# Patient Record
Sex: Female | Born: 1950 | Race: Black or African American | Hispanic: No | Marital: Married | Smoking: Former smoker
Health system: Southern US, Community
[De-identification: ages and names within clinical notes are randomized; demographics above are authoritative.]

## PROBLEM LIST (undated history)

## (undated) DIAGNOSIS — I10 Essential (primary) hypertension: Secondary | ICD-10-CM

## (undated) DIAGNOSIS — Z9010 Acquired absence of unspecified breast and nipple: Secondary | ICD-10-CM

## (undated) DIAGNOSIS — M1990 Unspecified osteoarthritis, unspecified site: Secondary | ICD-10-CM

## (undated) DIAGNOSIS — Z9882 Breast implant status: Secondary | ICD-10-CM

## (undated) DIAGNOSIS — Z9889 Other specified postprocedural states: Secondary | ICD-10-CM

## (undated) DIAGNOSIS — I639 Cerebral infarction, unspecified: Secondary | ICD-10-CM

## (undated) DIAGNOSIS — E119 Type 2 diabetes mellitus without complications: Secondary | ICD-10-CM

## (undated) DIAGNOSIS — C801 Malignant (primary) neoplasm, unspecified: Secondary | ICD-10-CM

## (undated) HISTORY — PX: REDUCTION MAMMAPLASTY: SUR839

## (undated) HISTORY — PX: SHOULDER SURGERY: SHX246

## (undated) HISTORY — PX: TUBAL LIGATION: SHX77

---

## 2014-01-13 DIAGNOSIS — R011 Cardiac murmur, unspecified: Secondary | ICD-10-CM | POA: Insufficient documentation

## 2014-01-13 DIAGNOSIS — E669 Obesity, unspecified: Secondary | ICD-10-CM | POA: Insufficient documentation

## 2014-01-13 DIAGNOSIS — E66811 Obesity, class 1: Secondary | ICD-10-CM | POA: Insufficient documentation

## 2015-04-29 HISTORY — PX: MASTECTOMY: SHX3

## 2017-06-24 ENCOUNTER — Inpatient Hospital Stay (HOSPITAL_COMMUNITY)
Admission: EM | Admit: 2017-06-24 | Discharge: 2017-06-26 | DRG: 603 | Disposition: A | Discharge status: Home / Self Care | Payer: Medicare Other | Attending: Family Medicine | Admitting: Family Medicine

## 2017-06-24 ENCOUNTER — Encounter (HOSPITAL_COMMUNITY): Payer: Self-pay | Admitting: Emergency Medicine

## 2017-06-24 ENCOUNTER — Emergency Department (HOSPITAL_COMMUNITY): Payer: Medicare Other

## 2017-06-24 DIAGNOSIS — Z91040 Latex allergy status: Secondary | ICD-10-CM

## 2017-06-24 DIAGNOSIS — L03221 Cellulitis of neck: Principal | ICD-10-CM | POA: Diagnosis present

## 2017-06-24 DIAGNOSIS — E872 Acidosis, unspecified: Secondary | ICD-10-CM | POA: Diagnosis present

## 2017-06-24 DIAGNOSIS — Z881 Allergy status to other antibiotic agents status: Secondary | ICD-10-CM | POA: Diagnosis not present

## 2017-06-24 DIAGNOSIS — Z8673 Personal history of transient ischemic attack (TIA), and cerebral infarction without residual deficits: Secondary | ICD-10-CM | POA: Diagnosis not present

## 2017-06-24 DIAGNOSIS — Z9010 Acquired absence of unspecified breast and nipple: Secondary | ICD-10-CM | POA: Insufficient documentation

## 2017-06-24 DIAGNOSIS — E109 Type 1 diabetes mellitus without complications: Secondary | ICD-10-CM | POA: Diagnosis present

## 2017-06-24 DIAGNOSIS — Z87891 Personal history of nicotine dependence: Secondary | ICD-10-CM | POA: Diagnosis not present

## 2017-06-24 DIAGNOSIS — Z853 Personal history of malignant neoplasm of breast: Secondary | ICD-10-CM | POA: Insufficient documentation

## 2017-06-24 DIAGNOSIS — E876 Hypokalemia: Secondary | ICD-10-CM | POA: Diagnosis not present

## 2017-06-24 DIAGNOSIS — C801 Malignant (primary) neoplasm, unspecified: Secondary | ICD-10-CM | POA: Diagnosis not present

## 2017-06-24 DIAGNOSIS — E119 Type 2 diabetes mellitus without complications: Secondary | ICD-10-CM

## 2017-06-24 DIAGNOSIS — K1120 Sialoadenitis, unspecified: Secondary | ICD-10-CM | POA: Diagnosis not present

## 2017-06-24 DIAGNOSIS — Z7982 Long term (current) use of aspirin: Secondary | ICD-10-CM

## 2017-06-24 DIAGNOSIS — M1990 Unspecified osteoarthritis, unspecified site: Secondary | ICD-10-CM | POA: Insufficient documentation

## 2017-06-24 DIAGNOSIS — Z9882 Breast implant status: Secondary | ICD-10-CM

## 2017-06-24 DIAGNOSIS — I10 Essential (primary) hypertension: Secondary | ICD-10-CM | POA: Diagnosis not present

## 2017-06-24 DIAGNOSIS — Z7984 Long term (current) use of oral hypoglycemic drugs: Secondary | ICD-10-CM | POA: Diagnosis not present

## 2017-06-24 DIAGNOSIS — Z79899 Other long term (current) drug therapy: Secondary | ICD-10-CM | POA: Diagnosis not present

## 2017-06-24 DIAGNOSIS — Z9889 Other specified postprocedural states: Secondary | ICD-10-CM | POA: Insufficient documentation

## 2017-06-24 DIAGNOSIS — A419 Sepsis, unspecified organism: Secondary | ICD-10-CM | POA: Diagnosis not present

## 2017-06-24 DIAGNOSIS — Z9012 Acquired absence of left breast and nipple: Secondary | ICD-10-CM

## 2017-06-24 DIAGNOSIS — K1121 Acute sialoadenitis: Secondary | ICD-10-CM | POA: Diagnosis not present

## 2017-06-24 DIAGNOSIS — K115 Sialolithiasis: Secondary | ICD-10-CM | POA: Diagnosis not present

## 2017-06-24 DIAGNOSIS — I639 Cerebral infarction, unspecified: Secondary | ICD-10-CM

## 2017-06-24 DIAGNOSIS — R221 Localized swelling, mass and lump, neck: Secondary | ICD-10-CM | POA: Diagnosis not present

## 2017-06-24 HISTORY — DX: Malignant (primary) neoplasm, unspecified: C80.1

## 2017-06-24 HISTORY — DX: Cerebral infarction, unspecified: I63.9

## 2017-06-24 HISTORY — DX: Other specified postprocedural states: Z98.890

## 2017-06-24 HISTORY — DX: Breast implant status: Z98.82

## 2017-06-24 HISTORY — DX: Acquired absence of unspecified breast and nipple: Z90.10

## 2017-06-24 HISTORY — DX: Type 2 diabetes mellitus without complications: E11.9

## 2017-06-24 HISTORY — DX: Essential (primary) hypertension: I10

## 2017-06-24 HISTORY — DX: Unspecified osteoarthritis, unspecified site: M19.90

## 2017-06-24 LAB — CBC WITH DIFFERENTIAL/PLATELET
BASOS ABS: 0 10*3/uL (ref 0.0–0.1)
Basophils Relative: 0 %
Eosinophils Absolute: 0 10*3/uL (ref 0.0–0.7)
Eosinophils Relative: 0 %
HEMATOCRIT: 36.9 % (ref 36.0–46.0)
Hemoglobin: 12.2 g/dL (ref 12.0–15.0)
LYMPHS PCT: 4 %
Lymphs Abs: 0.7 10*3/uL (ref 0.7–4.0)
MCH: 28 pg (ref 26.0–34.0)
MCHC: 33.1 g/dL (ref 30.0–36.0)
MCV: 84.6 fL (ref 78.0–100.0)
MONO ABS: 1.2 10*3/uL — AB (ref 0.1–1.0)
Monocytes Relative: 8 %
NEUTROS ABS: 13.7 10*3/uL — AB (ref 1.7–7.7)
Neutrophils Relative %: 88 %
Platelets: 252 10*3/uL (ref 150–400)
RBC: 4.36 MIL/uL (ref 3.87–5.11)
RDW: 12.8 % (ref 11.5–15.5)
WBC: 15.6 10*3/uL — ABNORMAL HIGH (ref 4.0–10.5)

## 2017-06-24 LAB — COMPREHENSIVE METABOLIC PANEL
ALT: 22 U/L (ref 14–54)
AST: 17 U/L (ref 15–41)
Albumin: 3.8 g/dL (ref 3.5–5.0)
Alkaline Phosphatase: 86 U/L (ref 38–126)
Anion gap: 14 (ref 5–15)
BILIRUBIN TOTAL: 0.6 mg/dL (ref 0.3–1.2)
BUN: 7 mg/dL (ref 6–20)
CALCIUM: 9.2 mg/dL (ref 8.9–10.3)
CO2: 23 mmol/L (ref 22–32)
CREATININE: 0.79 mg/dL (ref 0.44–1.00)
Chloride: 97 mmol/L — ABNORMAL LOW (ref 101–111)
GFR calc Af Amer: >60 mL/min (ref >60)
Glucose, Bld: 257 mg/dL — ABNORMAL HIGH (ref 65–99)
Potassium: 2.8 mmol/L — ABNORMAL LOW (ref 3.5–5.1)
Sodium: 134 mmol/L — ABNORMAL LOW (ref 135–145)
TOTAL PROTEIN: 7.7 g/dL (ref 6.5–8.1)

## 2017-06-24 LAB — I-STAT CHEM 8, ED
BUN: 7 mg/dL (ref 6–20)
CALCIUM ION: 1.03 mmol/L — AB (ref 1.15–1.40)
CREATININE: 0.6 mg/dL (ref 0.44–1.00)
Chloride: 96 mmol/L — ABNORMAL LOW (ref 101–111)
Glucose, Bld: 265 mg/dL — ABNORMAL HIGH (ref 65–99)
HCT: 38 % (ref 36.0–46.0)
Hemoglobin: 12.9 g/dL (ref 12.0–15.0)
Potassium: 2.7 mmol/L — CL (ref 3.5–5.1)
Sodium: 138 mmol/L (ref 135–145)
TCO2: 25 mmol/L (ref 0–100)

## 2017-06-24 LAB — GLUCOSE, CAPILLARY
Glucose-Capillary: 250 mg/dL — ABNORMAL HIGH (ref 65–99)
Glucose-Capillary: 264 mg/dL — ABNORMAL HIGH (ref 65–99)
Glucose-Capillary: 124 mg/dL — ABNORMAL HIGH (ref 65–99)
Glucose-Capillary: 83 mg/dL (ref 65–99)

## 2017-06-24 LAB — I-STAT CG4 LACTIC ACID, ED
LACTIC ACID, VENOUS: 2.1 mmol/L — AB (ref 0.5–1.9)
Lactic Acid, Venous: 1.94 mmol/L (ref 0.5–1.9)

## 2017-06-24 LAB — RAPID STREP SCREEN (MED CTR MEBANE ONLY): STREPTOCOCCUS, GROUP A SCREEN (DIRECT): NEGATIVE

## 2017-06-24 MED ORDER — ENOXAPARIN SODIUM 40 MG/0.4ML ~~LOC~~ SOLN
40.0000 mg | SUBCUTANEOUS | Status: DC
  Administered 2017-06-24 – 2017-06-26 (×2): 40 mg via SUBCUTANEOUS
  Filled 2017-06-24 (×2): qty 0.4

## 2017-06-24 MED ORDER — INSULIN ASPART PROT & ASPART (70-30 MIX) 100 UNIT/ML ~~LOC~~ SUSP
22.0000 [IU] | Freq: Every day | SUBCUTANEOUS | Status: DC
  Administered 2017-06-24 – 2017-06-26 (×3): 22 [IU] via SUBCUTANEOUS
  Filled 2017-06-24: qty 10

## 2017-06-24 MED ORDER — SODIUM CHLORIDE 0.9 % IV SOLN
INTRAVENOUS | Status: DC
  Administered 2017-06-24 – 2017-06-25 (×3): via INTRAVENOUS

## 2017-06-24 MED ORDER — BECLOMETHASONE DIPROPIONATE 80 MCG/ACT IN AERS: 2.0000 | INHALATION_SPRAY | Freq: Two times a day (BID) | RESPIRATORY_TRACT | Status: DC

## 2017-06-24 MED ORDER — AMLODIPINE BESYLATE 10 MG PO TABS
10.0000 mg | ORAL_TABLET | Freq: Every day | ORAL | Status: DC
  Administered 2017-06-24 – 2017-06-26 (×3): 10 mg via ORAL
  Filled 2017-06-24 (×3): qty 1

## 2017-06-24 MED ORDER — PIPERACILLIN-TAZOBACTAM 3.375 G IVPB: 3.3750 g | Freq: Three times a day (TID) | INTRAVENOUS | Status: DC

## 2017-06-24 MED ORDER — SODIUM CHLORIDE 0.9 % IV BOLUS (SEPSIS)
1000.0000 mL | Freq: Once | INTRAVENOUS | Status: AC
  Administered 2017-06-24: 1000 mL via INTRAVENOUS

## 2017-06-24 MED ORDER — INSULIN ASPART 100 UNIT/ML ~~LOC~~ SOLN
0.0000 [IU] | Freq: Three times a day (TID) | SUBCUTANEOUS | Status: DC
  Administered 2017-06-24: 8 [IU] via SUBCUTANEOUS
  Administered 2017-06-25: 2 [IU] via SUBCUTANEOUS
  Administered 2017-06-26: 5 [IU] via SUBCUTANEOUS

## 2017-06-24 MED ORDER — POTASSIUM CHLORIDE 10 MEQ/100ML IV SOLN
10.0000 meq | INTRAVENOUS | Status: AC
  Administered 2017-06-24 (×3): 10 meq via INTRAVENOUS
  Filled 2017-06-24 (×3): qty 100

## 2017-06-24 MED ORDER — LISINOPRIL 20 MG PO TABS
20.0000 mg | ORAL_TABLET | Freq: Every day | ORAL | Status: DC
  Administered 2017-06-24 – 2017-06-26 (×3): 20 mg via ORAL
  Filled 2017-06-24 (×3): qty 1

## 2017-06-24 MED ORDER — BISACODYL 10 MG RE SUPP: 10.0000 mg | Freq: Every day | RECTAL | Status: DC | PRN

## 2017-06-24 MED ORDER — ATORVASTATIN CALCIUM 20 MG PO TABS
20.0000 mg | ORAL_TABLET | Freq: Every day | ORAL | Status: DC
  Administered 2017-06-24 – 2017-06-26 (×3): 20 mg via ORAL
  Filled 2017-06-24 (×3): qty 1

## 2017-06-24 MED ORDER — ACETAMINOPHEN 650 MG RE SUPP: 650.0000 mg | Freq: Four times a day (QID) | RECTAL | Status: DC | PRN

## 2017-06-24 MED ORDER — SODIUM CHLORIDE 0.9 % IV BOLUS (SEPSIS)
500.0000 mL | Freq: Once | INTRAVENOUS | Status: AC
  Administered 2017-06-24: 500 mL via INTRAVENOUS

## 2017-06-24 MED ORDER — ACETAMINOPHEN 325 MG PO TABS
650.0000 mg | ORAL_TABLET | Freq: Four times a day (QID) | ORAL | Status: DC | PRN
  Administered 2017-06-25: 650 mg via ORAL
  Filled 2017-06-24: qty 2

## 2017-06-24 MED ORDER — VANCOMYCIN HCL IN DEXTROSE 1-5 GM/200ML-% IV SOLN
1000.0000 mg | Freq: Once | INTRAVENOUS | Status: AC
  Administered 2017-06-24: 1000 mg via INTRAVENOUS
  Filled 2017-06-24: qty 200

## 2017-06-24 MED ORDER — ALBUTEROL SULFATE HFA 108 (90 BASE) MCG/ACT IN AERS: 2.0000 | INHALATION_SPRAY | Freq: Four times a day (QID) | RESPIRATORY_TRACT | Status: DC | PRN

## 2017-06-24 MED ORDER — VANCOMYCIN HCL IN DEXTROSE 1-5 GM/200ML-% IV SOLN: 1000.0000 mg | Freq: Two times a day (BID) | INTRAVENOUS | Status: DC

## 2017-06-24 MED ORDER — LETROZOLE 2.5 MG PO TABS
2.5000 mg | ORAL_TABLET | Freq: Every day | ORAL | Status: DC
  Administered 2017-06-24 – 2017-06-26 (×3): 2.5 mg via ORAL
  Filled 2017-06-24 (×3): qty 1

## 2017-06-24 MED ORDER — AMPICILLIN-SULBACTAM SODIUM 3 (2-1) G IJ SOLR
3.0000 g | Freq: Three times a day (TID) | INTRAMUSCULAR | Status: DC
  Administered 2017-06-24 – 2017-06-26 (×6): 3 g via INTRAVENOUS
  Filled 2017-06-24 (×7): qty 3

## 2017-06-24 MED ORDER — IOPAMIDOL (ISOVUE-300) INJECTION 61%
INTRAVENOUS | Status: AC
  Administered 2017-06-24: 75 mL
  Filled 2017-06-24: qty 75

## 2017-06-24 MED ORDER — FENTANYL CITRATE (PF) 100 MCG/2ML IJ SOLN
50.0000 ug | Freq: Once | INTRAMUSCULAR | Status: AC
  Administered 2017-06-24: 50 ug via INTRAVENOUS
  Filled 2017-06-24: qty 2

## 2017-06-24 MED ORDER — BUDESONIDE 0.5 MG/2ML IN SUSP
0.5000 mg | Freq: Two times a day (BID) | RESPIRATORY_TRACT | Status: DC
  Administered 2017-06-24 (×2): 0.5 mg via RESPIRATORY_TRACT
  Filled 2017-06-24 (×2): qty 2

## 2017-06-24 MED ORDER — ALBUTEROL SULFATE (2.5 MG/3ML) 0.083% IN NEBU: 2.5000 mg | INHALATION_SOLUTION | Freq: Four times a day (QID) | RESPIRATORY_TRACT | Status: DC | PRN

## 2017-06-24 MED ORDER — CHLORHEXIDINE GLUCONATE 0.12 % MT SOLN
5.0000 mL | Freq: Four times a day (QID) | OROMUCOSAL | Status: DC
  Administered 2017-06-24 – 2017-06-25 (×6): 5 mL via OROMUCOSAL
  Filled 2017-06-24 (×7): qty 15

## 2017-06-24 MED ORDER — HYDROCHLOROTHIAZIDE 12.5 MG PO CAPS
12.5000 mg | ORAL_CAPSULE | Freq: Every day | ORAL | Status: DC
  Administered 2017-06-24 – 2017-06-26 (×3): 12.5 mg via ORAL
  Filled 2017-06-24 (×3): qty 1

## 2017-06-24 MED ORDER — INSULIN ASPART PROT & ASPART (70-30 MIX) 100 UNIT/ML ~~LOC~~ SUSP: 18.0000 [IU] | Freq: Two times a day (BID) | SUBCUTANEOUS | Status: DC

## 2017-06-24 MED ORDER — KETOROLAC TROMETHAMINE 15 MG/ML IJ SOLN
15.0000 mg | Freq: Four times a day (QID) | INTRAMUSCULAR | Status: DC | PRN
  Administered 2017-06-24: 15 mg via INTRAVENOUS
  Filled 2017-06-24: qty 1

## 2017-06-24 MED ORDER — DOXAZOSIN MESYLATE 4 MG PO TABS
4.0000 mg | ORAL_TABLET | Freq: Two times a day (BID) | ORAL | Status: DC
  Administered 2017-06-24 – 2017-06-26 (×5): 4 mg via ORAL
  Filled 2017-06-24 (×5): qty 1

## 2017-06-24 MED ORDER — ACETAMINOPHEN 500 MG PO TABS
1000.0000 mg | ORAL_TABLET | Freq: Once | ORAL | Status: AC
  Administered 2017-06-24: 1000 mg via ORAL
  Filled 2017-06-24: qty 2

## 2017-06-24 MED ORDER — PIPERACILLIN-TAZOBACTAM 3.375 G IVPB 30 MIN
3.3750 g | Freq: Once | INTRAVENOUS | Status: AC
  Administered 2017-06-24: 3.375 g via INTRAVENOUS
  Filled 2017-06-24: qty 50

## 2017-06-24 MED ORDER — METOPROLOL TARTRATE 100 MG PO TABS
100.0000 mg | ORAL_TABLET | Freq: Two times a day (BID) | ORAL | Status: DC
  Administered 2017-06-24 – 2017-06-26 (×5): 100 mg via ORAL
  Filled 2017-06-24 (×5): qty 1

## 2017-06-24 MED ORDER — ASPIRIN EC 325 MG PO TBEC
325.0000 mg | DELAYED_RELEASE_TABLET | Freq: Every day | ORAL | Status: DC
  Administered 2017-06-24 – 2017-06-26 (×3): 325 mg via ORAL
  Filled 2017-06-24 (×3): qty 1

## 2017-06-24 MED ORDER — INSULIN ASPART PROT & ASPART (70-30 MIX) 100 UNIT/ML ~~LOC~~ SUSP
18.0000 [IU] | Freq: Every day | SUBCUTANEOUS | Status: DC
  Administered 2017-06-24 – 2017-06-25 (×2): 18 [IU] via SUBCUTANEOUS
  Filled 2017-06-24: qty 10

## 2017-06-24 MED ORDER — INSULIN ASPART 100 UNIT/ML ~~LOC~~ SOLN
0.0000 [IU] | Freq: Every day | SUBCUTANEOUS | Status: DC
  Administered 2017-06-25: 5 [IU] via SUBCUTANEOUS

## 2017-06-24 MED ORDER — HYDROCODONE-ACETAMINOPHEN 5-325 MG PO TABS
1.0000 | ORAL_TABLET | ORAL | Status: DC | PRN
  Administered 2017-06-24: 1 via ORAL
  Filled 2017-06-24: qty 1

## 2017-06-24 NOTE — ED Notes (Signed)
Patient transported to CT 

## 2017-06-24 NOTE — ED Notes (Signed)
ED Provider at bedside. 

## 2017-06-24 NOTE — ED Provider Notes (Signed)
[facility] DEPT Provider Note   CSN: [id number] Arrival date & time: 06/24/17  0119   By signing my name below, I, Beth Blankenship, attest that this documentation has been prepared under the direction and in the presence of Katalyna Socarras, Annie Main, MD. Electronically signed, Beth Blankenship, ED Scribe. 06/24/17. 3:12 AM.  History   Chief Complaint Chief Complaint  Patient presents with  . Dental Pain  . Facial Swelling   The history is provided by the patient, medical records and the spouse. No language interpreter was used.    Beth Blankenship is a 66 y.o. female presenting to the Emergency Department concerning gradually worsening, R > L throat swelling onset < 24 hours prior to evaluation. Pt states symptoms began with throat soreness. Associated difficulty swallowing and breathing, hot sensation, gum soreness, earache, R jaw pain, headache and tongue swelling. She described 10/10, constant, aching, R ear and jaw pains in triage. Pt has reportedly taken ibuprofen, asa, chloraseptic without relief. She is able to tolerate saliva; she reports gradually worsening difficulty doing this. H/o DM, HTN and breast cancer noted. H/o mini strokes reported. She states her blood sugar has been well controlled at home. Breast cancer in remission following hormone therapy, mastectomy and breast reconstruction surgery. No recent dental problems noted. No h/o heart disorders besides chronic murmur. No N/V. No PCP; pt states she is new to the area. No other complaints at this time.   Past Medical History:  Diagnosis Date  . Arthritis   . Cancer ([place])   . Diabetes mellitus without complication ([place])   . H/O mastectomy    left side  . History of breast reconstruction   . Hypertension   . Stroke [facility])    mini strokes    There are no active problems to display for this patient.   Past Surgical History:  Procedure Laterality Date  . SHOULDER SURGERY Right     OB History    No data available        Home Medications    Prior to Admission medications   Medication Sig Start Date End Date Taking? Authorizing Provider  albuterol (PROVENTIL HFA;VENTOLIN HFA) 108 (90 Base) MCG/ACT inhaler Inhale 2 puffs into the lungs every 6 (six) hours as needed for wheezing or shortness of breath.   Yes [provider]  alendronate (FOSAMAX) 70 MG tablet Take 70 mg by mouth once a week. On Sunday   Yes [provider]  amLODipine (NORVASC) 10 MG tablet Take 10 mg by mouth daily.   Yes [provider]  aspirin EC 325 MG tablet Take 325 mg by mouth daily.   Yes [provider]  atorvastatin (LIPITOR) 20 MG tablet Take 20 mg by mouth daily.   Yes [provider]  beclomethasone (QVAR) 80 MCG/ACT inhaler Inhale 2 puffs into the lungs 2 (two) times daily.   Yes [provider]  Calcium-Vitamin D 600-200 MG-UNIT tablet Take 1 tablet by mouth daily.   Yes [provider]  doxazosin (CARDURA) 4 MG tablet Take 4 mg by mouth 2 (two) times daily.   Yes [provider]  glipiZIDE (GLUCOTROL) 10 MG tablet Take 10 mg by mouth 2 (two) times daily before a meal.   Yes [provider]  hydrochlorothiazide (MICROZIDE) 12.5 MG capsule Take 12.5 mg by mouth daily.   Yes [provider]  insulin NPH-regular Human (NOVOLIN 70/30) (70-30) 100 UNIT/ML injection Inject 18-22 Units into the skin See admin instructions. Use 22 units every morning  and use 18 units before supper   Yes [provider]  letrozole (FEMARA) 2.5 MG tablet Take 2.5 mg by mouth daily.   Yes [provider]  lisinopril (PRINIVIL,ZESTRIL) 20 MG tablet Take 20 mg by mouth daily.   Yes [provider]  metFORMIN (GLUCOPHAGE) 1000 MG tablet Take 1,000 mg by mouth 2 (two) times daily with a meal.   Yes [provider]  metoprolol tartrate (LOPRESSOR) 100 MG tablet Take 100 mg by mouth 2 (two) times daily.   Yes [provider]     Family History No family history on file.  Social History Social History  Substance Use Topics  . Smoking status: Former Research scientist (life sciences)  . Smokeless tobacco: Never Used  . Alcohol use No     Allergies   Latex and Tetracyclines & related   Review of Systems Review of Systems  Constitutional: Positive for chills.  HENT: Positive for dental problem, facial swelling, sore throat, trouble swallowing and voice change.   Respiratory: Positive for shortness of breath.   Gastrointestinal: Negative for nausea and vomiting.  Neurological: Positive for headaches.  All other systems reviewed and are negative.    Physical Exam Updated Vital Signs BP (!) 177/88   Pulse (!) 111   Temp 98.8 F (37.1 C) (Oral)   Resp 18   Ht 5\' 3"  (1.6 m)   Wt 194 lb (88 kg)   SpO2 97%   BMI 34.37 kg/m   Physical Exam  Constitutional: She is oriented to person, place, and time. She appears well-developed and well-nourished. No distress.  Muffled voice. Controlling secretions. Foul smell from mouth.  HENT:  Head: Normocephalic and atraumatic.  Mouth/Throat: Oropharynx is clear and moist. No oropharyngeal exudate.  Firm induration along R mandible extending to submental region. Trismus of 4 cm. Apparent abscess to R floor of mouth.  Eyes: Conjunctivae and EOM are normal. Pupils are equal, round, and reactive to light.  Neck: Normal range of motion. Neck supple.  No meningismus.  Cardiovascular: Normal rate, regular rhythm, normal heart sounds and intact distal pulses.   No murmur heard. Pulmonary/Chest: Effort normal and breath sounds normal. No respiratory distress.  Abdominal: Soft. There is no tenderness. There is no rebound and no guarding.  Musculoskeletal: Normal range of motion. She exhibits no edema or tenderness.  Neurological: She is alert and oriented to person, place, and time. No cranial nerve deficit. She exhibits normal muscle tone. Coordination normal.   5/5 strength throughout. CN  2-12 intact.Equal grip strength.   Skin: Skin is warm.  Psychiatric: She has a normal mood and affect. Her behavior is normal.  Nursing note and vitals reviewed.    ED Treatments / Results  DIAGNOSTIC STUDIES: Oxygen Saturation is 97% on RA, NL by my interpretation.    COORDINATION OF CARE: 3:05 AM-Discussed next steps with pt. Pt verbalized understanding and is agreeable with the plan. Will order blood work, IV fluids and C/T scan.    Labs (all labs ordered are listed, but only abnormal results are displayed) Labs Reviewed  CBC WITH DIFFERENTIAL/PLATELET - Abnormal; Notable for the following:       Result Value   WBC 15.6 (*)    Neutro Abs 13.7 (*)    Monocytes Absolute 1.2 (*)    All other components within normal limits  COMPREHENSIVE METABOLIC PANEL - Abnormal; Notable for the following:    Sodium 134 (*)    Potassium 2.8 (*)    Chloride 97 (*)  Glucose, Bld 257 (*)    All other components within normal limits  I-STAT CG4 LACTIC ACID, ED - Abnormal; Notable for the following:    Lactic Acid, Venous 2.10 (*)    All other components within normal limits  I-STAT CHEM 8, ED - Abnormal; Notable for the following:    Potassium 2.7 (*)    Chloride 96 (*)    Glucose, Bld 265 (*)    Calcium, Ion 1.03 (*)    All other components within normal limits  RAPID STREP SCREEN (NOT AT [facility])  CULTURE, GROUP A STREP ([place])  CULTURE, BLOOD (ROUTINE X 2)  CULTURE, BLOOD (ROUTINE X 2)    EKG  EKG Interpretation None       Radiology Ct Soft Tissue Neck W Contrast  Result Date: 06/24/2017 CLINICAL DATA:  66 y/o  F; right neck swelling. EXAM: CT NECK WITH CONTRAST TECHNIQUE: Multidetector CT imaging of the neck was performed using the standard protocol following the bolus administration of intravenous contrast. CONTRAST:  86mL ISOVUE-300 IOPAMIDOL (ISOVUE-300) INJECTION 61% COMPARISON:  None. FINDINGS: Pharynx and larynx: Mucosal thickening of the right lateral oral and hypopharynx.  Salivary glands: Dilatation of the right submandibular duct with probable 4 mm stone obstructing the distal duct (series 6, image 13). Other salivary glands are normal. Thyroid: Normal. Lymph nodes: Mild prominence of upper cervical lymph nodes and submandibular lymph nodes are likely reactive. Vascular: Moderate calcific atherosclerosis of carotid bifurcations without significant stenosis. Limited intracranial: Negative. Visualized orbits: Negative. Mastoids and visualized paranasal sinuses: Clear. Skeleton: No acute or aggressive process. Upper chest: Negative. Other: Inflammatory changes within the right greater than left submandibular and sublingual spaces, right parapharyngeal space, and subcutaneous fat of the upper neck. No discrete abscess is identified. IMPRESSION: 1. Dilatation of right submandibular duct with probable 4 mm obstructing sialolith within the anterior floor of mouth. The region of interest is directly obscured by streak artifact from dental hardware. 2. Inflammation within the right greater than left upper neck including submandibular spaces, sublingual spaces, and right parapharyngeal space. No discrete rim enhancing abscess is identified. These results were called by telephone at the time of interpretation on 06/24/2017 at 4:35 am to Dr. Ezequiel Essex , who verbally acknowledged these results. Electronically Signed   By: Kristine Garbe M.D.   On: 06/24/2017 04:41    Procedures Procedures (including critical care time)  Medications Ordered in ED Medications - No data to display   Initial Impression / Assessment and Plan / ED Course  I have reviewed the triage vital signs and the nursing notes.  Pertinent labs & imaging results that were available during my care of the patient were reviewed by me and considered in my medical decision making (see chart for details).     Patient presents with right-sided facial swelling, difficulty breathing, difficulty swallowing  that onset yesterday morning. She is tachycardic and hypertensive.  Swelling of the right side of her neck as well as floor of the mouth. She is controlling her secretions but has a muffled voice. Pus expressed from floor of mouth with palpation.  Protecting airway at this time. CT scan obtained to evaluate for soft tissue abscess.  Patient started on IV antibiotics and IV fluids. She is also significant hypokalemic.  No abscess seen but there is a large salivary gland stone with subcutaneous edema and cellulitis. D/w Dr. Erik Obey of ENT. He saw patient and probed duct and bedside and removed stone.   Patient to be admitted for IV  antibiotics and monitoring. D/w Dr. Nehemiah Settle.   CRITICAL CARE Performed by: Ezequiel Essex Total critical care time: 35 minutes Critical care time was exclusive of separately billable procedures and treating other patients. Critical care was necessary to treat or prevent imminent or life-threatening deterioration. Critical care was time spent personally by me on the following activities: development of treatment plan with patient and/or surrogate as well as nursing, discussions with consultants, evaluation of patient's response to treatment, examination of patient, obtaining history from patient or surrogate, ordering and performing treatments and interventions, ordering and review of laboratory studies, ordering and review of radiographic studies, pulse oximetry and re-evaluation of patient's condition.   Final Clinical Impressions(s) / ED Diagnoses   Final diagnoses:  Sepsis, due to unspecified organism ([place])  Stone of salivary gland    New Prescriptions New Prescriptions   No medications on file  I personally performed the services described in this documentation, which was scribed in my presence. The recorded information has been reviewed and is accurate.   Ezequiel Essex, MD 06/24/17 [phone]

## 2017-06-24 NOTE — Progress Notes (Signed)
Date 06/24/2017 Time 0920  New Admission Note:  Patient admitted to 6N15 from ED. Transported via stretcher.  Arrival Method: Able to ambulate from stretcher to bed.  Mental Orientation: Alert and oriented x 4 Telemetry: n/a Assessment: completed Skin: intact. Verified by 2nd RN IV: PIV R AC NSL Pain: 4/10 in mouth where I and D performed Tubes: n/a Safety Measures: fall plan reviewed.  Admission: completed [place] Orientation: completed Family: husband not at bedside, but patient states he will return Personal Belongings: clotthing and purse at bedside. Husband to take purse home. Advised of valuables policy.   Orders have been reviewed and implemented. Will continue to monitor the patient. Call light has been placed within reach and bed alarm has been activated.   Manya Silvas, RN MSN CNE [place] Phone number: [phone]

## 2017-06-24 NOTE — Progress Notes (Signed)
Pharmacy Antibiotic Note Beth Blankenship is a 66 y.o. female admitted on 06/24/2017 with concern for R neck abscess. Pharmacy has been consulted for Zosyn and vancomycin dosing.  Plan: Vancomycin 1000 IV every 12 hours.  Goal trough 15-20 mcg/mL. Zosyn 3.375g IV q8h (4 hour infusion).  Height: 5\' 3"  (160 cm) Weight: 194 lb (88 kg) IBW/kg (Calculated) : 52.4  Temp (24hrs), Avg:98.8 F (37.1 C), Min:98.8 F (37.1 C), Max:98.8 F (37.1 C)   Recent Labs Lab 06/24/17 0321 06/24/17 0342 06/24/17 0343  WBC 15.6*  --   --   CREATININE  --  0.60  --   LATICACIDVEN  --   --  2.10*    Estimated Creatinine Clearance: 73.7 mL/min (by C-G formula based on SCr of 0.6 mg/dL).    Allergies  Allergen Reactions  . Latex Hives  . Tetracyclines & Related Hives    Vincenza Hews, PharmD, BCPS 06/24/2017, 3:58 AM

## 2017-06-24 NOTE — Consult Note (Signed)
Konstance, Happel 66 y.o., female [id number]     Chief Complaint: RIGHT neck swelling.  HPI: 66 yo bf noted sudden onset RIGHT neck and floor of mouth swelling yesterday.  No fever.  No prior occurrence.  WBC 15K.  CT in ED shows dilated RIGHT submandibular salivary duct with distal stone.  Painful to swallow.  No breathing difficulty.  Has known HTN and type II DM.    PMH: Past Medical History:  Diagnosis Date  . Arthritis   . Cancer ([place])   . Diabetes mellitus without complication ([place])   . H/O mastectomy    left side  . History of breast reconstruction   . Hypertension   . Stroke [facility])    mini strokes    Surg Hx: Past Surgical History:  Procedure Laterality Date  . SHOULDER SURGERY Right     FHx:  No family history on file. SocHx:  reports that she has quit smoking. She has never used smokeless tobacco. She reports that she does not drink alcohol. Her drug history is not on file.  ALLERGIES:  Allergies  Allergen Reactions  . Latex Hives  . Tetracyclines & Related Hives     (Not in a hospital admission)  Results for orders placed or performed during the hospital encounter of 06/24/17 (from the past 48 hour(s))  Rapid strep screen     Status: None   Collection Time: 06/24/17  1:38 AM  Result Value Ref Range   Streptococcus, Group A Screen (Direct) NEGATIVE NEGATIVE    Comment: (NOTE) A Rapid Antigen test may result negative if the antigen level in the sample is below the detection level of this test. The FDA has not cleared this test as a stand-alone test therefore the rapid antigen negative result has reflexed to a Group A Strep culture.   CBC with Differential/Platelet     Status: Abnormal   Collection Time: 06/24/17  3:21 AM  Result Value Ref Range   WBC 15.6 (H) 4.0 - 10.5 K/uL   RBC 4.36 3.87 - 5.11 MIL/uL   Hemoglobin 12.2 12.0 - 15.0 g/dL   HCT 36.9 36.0 - 46.0 %   MCV 84.6 78.0 - 100.0 fL   MCH 28.0 26.0 - 34.0 pg   MCHC 33.1 30.0 - 36.0 g/dL   RDW  12.8 11.5 - 15.5 %   Platelets 252 150 - 400 K/uL   Neutrophils Relative % 88 %   Neutro Abs 13.7 (H) 1.7 - 7.7 K/uL   Lymphocytes Relative 4 %   Lymphs Abs 0.7 0.7 - 4.0 K/uL   Monocytes Relative 8 %   Monocytes Absolute 1.2 (H) 0.1 - 1.0 K/uL   Eosinophils Relative 0 %   Eosinophils Absolute 0.0 0.0 - 0.7 K/uL   Basophils Relative 0 %   Basophils Absolute 0.0 0.0 - 0.1 K/uL  Comprehensive metabolic panel     Status: Abnormal   Collection Time: 06/24/17  3:21 AM  Result Value Ref Range   Sodium 134 (L) 135 - 145 mmol/L   Potassium 2.8 (L) 3.5 - 5.1 mmol/L   Chloride 97 (L) 101 - 111 mmol/L   CO2 23 22 - 32 mmol/L   Glucose, Bld 257 (H) 65 - 99 mg/dL   BUN 7 6 - 20 mg/dL   Creatinine, Ser 0.79 0.44 - 1.00 mg/dL   Calcium 9.2 8.9 - 10.3 mg/dL   Total Protein 7.7 6.5 - 8.1 g/dL   Albumin 3.8 3.5 - 5.0 g/dL  AST 17 15 - 41 U/L   ALT 22 14 - 54 U/L   Alkaline Phosphatase 86 38 - 126 U/L   Total Bilirubin 0.6 0.3 - 1.2 mg/dL   GFR calc non Af Amer >60 >60 mL/min   GFR calc Af Amer >60 >60 mL/min    Comment: (NOTE) The eGFR has been calculated using the CKD EPI equation. This calculation has not been validated in all clinical situations. eGFR's persistently <60 mL/min signify possible Chronic Kidney Disease.    Anion gap 14 5 - 15  I-stat chem 8, ed     Status: Abnormal   Collection Time: 06/24/17  3:42 AM  Result Value Ref Range   Sodium 138 135 - 145 mmol/L   Potassium 2.7 (LL) 3.5 - 5.1 mmol/L   Chloride 96 (L) 101 - 111 mmol/L   BUN 7 6 - 20 mg/dL   Creatinine, Ser 0.60 0.44 - 1.00 mg/dL   Glucose, Bld 265 (H) 65 - 99 mg/dL   Calcium, Ion 1.03 (L) 1.15 - 1.40 mmol/L   TCO2 25 0 - 100 mmol/L   Hemoglobin 12.9 12.0 - 15.0 g/dL   HCT 38.0 36.0 - 46.0 %   Comment NOTIFIED PHYSICIAN   I-Stat CG4 Lactic Acid, ED     Status: Abnormal   Collection Time: 06/24/17  3:43 AM  Result Value Ref Range   Lactic Acid, Venous 2.10 (HH) 0.5 - 1.9 mmol/L   Comment NOTIFIED  PHYSICIAN   I-Stat CG4 Lactic Acid, ED     Status: Abnormal   Collection Time: 06/24/17  6:20 AM  Result Value Ref Range   Lactic Acid, Venous 1.94 (HH) 0.5 - 1.9 mmol/L   Ct Soft Tissue Neck W Contrast  Result Date: 06/24/2017 CLINICAL DATA:  66 y/o  F; right neck swelling. EXAM: CT NECK WITH CONTRAST TECHNIQUE: Multidetector CT imaging of the neck was performed using the standard protocol following the bolus administration of intravenous contrast. CONTRAST:  75mL ISOVUE-300 IOPAMIDOL (ISOVUE-300) INJECTION 61% COMPARISON:  None. FINDINGS: Pharynx and larynx: Mucosal thickening of the right lateral oral and hypopharynx. Salivary glands: Dilatation of the right submandibular duct with probable 4 mm stone obstructing the distal duct (series 6, image 13). Other salivary glands are normal. Thyroid: Normal. Lymph nodes: Mild prominence of upper cervical lymph nodes and submandibular lymph nodes are likely reactive. Vascular: Moderate calcific atherosclerosis of carotid bifurcations without significant stenosis. Limited intracranial: Negative. Visualized orbits: Negative. Mastoids and visualized paranasal sinuses: Clear. Skeleton: No acute or aggressive process. Upper chest: Negative. Other: Inflammatory changes within the right greater than left submandibular and sublingual spaces, right parapharyngeal space, and subcutaneous fat of the upper neck. No discrete abscess is identified. IMPRESSION: 1. Dilatation of right submandibular duct with probable 4 mm obstructing sialolith within the anterior floor of mouth. The region of interest is directly obscured by streak artifact from dental hardware. 2. Inflammation within the right greater than left upper neck including submandibular spaces, sublingual spaces, and right parapharyngeal space. No discrete rim enhancing abscess is identified. These results were called by telephone at the time of interpretation on 06/24/2017 at 4:35 am to Dr. STEPHEN RANCOUR , who verbally  acknowledged these results. Electronically Signed   By: Lance  Furusawa-Stratton M.D.   On: 06/24/2017 04:41     Blood pressure (!) 149/68, pulse (!) 116, temperature (S) (!) 101 F (38.3 C), temperature source Oral, resp. rate 16, height 5' 3" (1.6 m), weight 88 kg (194 lb), SpO2 97 %.    PHYSICAL EXAM: Overall appearance:  Heavyset, some distress.  Some dysarthria. Head:  NCAT Ears: not examined Nose: not examined Oral Cavity:  Swollen RIGHT FOM, eschar at Wharton's papilla with frank pus emanating.  No palpable stone.   Oral Pharynx/Hypopharynx/Larynx:  Difficult to see but nl Neuro: grossly intact Neck: tender swelling RIGHT upper neck with enlarged submandibular gland  Studies Reviewed:  CT neck    Assessment/Plan RIGHT submandibular obstructing stone with sialadenitis and neck cellulitis.    Plan:  With informed consent, I anesthetized the FOM with Cetacaine spray.  1% xylocaine with 1:100,000 epi infiltrated, 6 ml total in stages.  After ascertaining adequate anesthesia, Wharton's papilla was amputated sharply.  A Joseph scissor was placed into the duct and a 8 mm incision was made back to a visible stone which was expressed easily.  Free flow of pus was noted.  Duct was probed back to hilum of gland with no further stones or blockage noted.   Hemostasis was spontaneous.  Pt tolerated procedure well.   Agree with overnight admission and IV abx for neck cellulitis given her DM.  I wrote orders.  Recheck my office 1 week if not well, 3-4 weeks if doing well.    ,  06/24/2017, 7:07 AM    

## 2017-06-24 NOTE — ED Triage Notes (Signed)
Reports pain in throat, ears, right jaw with swelling in right side of face.  Also reports headache at this time.  Took ibuprofen, asa, and chloraseptic spray with no relief.

## 2017-06-24 NOTE — H&P (Addendum)
History and Physical  Beth Blankenship DOB: 06/25/1951 DOA: 06/24/2017  Referring physician: Dr Wyvonnia Dusky, ED physician PCP: System, Pcp Not In  Outpatient Specialists: none  Patient Coming From: home  Chief Complaint: Neck pain, difficulty swallowing  HPI: Beth Blankenship is a 66 y.o. female with a history of insulin-dependent diabetes, history of breast cancer status post mastectomy and reconstructive surgery on aromatase inhibitor, essential hypertension, TIAs. Patient presented the hospital after 24 hours of worsening neck pain and difficulty swallowing. Her symptoms started yesterday morning and gradually progressed throughout the day. She had difficulty swallowing foods only soups. She has never had anything like this before. She does describe chills at home but denies fever, although she had a fever when she resented to the hospital. No other palliating or provoking factors.  Emergency Department Course: CT of the neck shows dilation of the right submandibular duct with a 4 mm obstructing distal stone and inflammatory changes consistent with Cellulitis  Review of Systems:   Pt denies any nausea, vomiting, diarrhea, constipation, abdominal pain, shortness of breath, dyspnea on exertion, orthopnea, cough, wheezing, palpitations, headache, vision changes, lightheadedness, dizziness, melena, rectal bleeding.  Review of systems are otherwise negative  Past Medical History:  Diagnosis Date  . Arthritis   . Cancer ([place])   . Diabetes mellitus without complication ([place])   . H/O mastectomy    left side  . History of breast reconstruction   . Hypertension   . Stroke [facility])    mini strokes   Past Surgical History:  Procedure Laterality Date  . SHOULDER SURGERY Right    Social History:  reports that she has quit smoking. She has never used smokeless tobacco. She reports that she does not drink alcohol. Her drug history is not on file. Patient lives at [place]  . Latex Hives  . Tetracyclines & Related Hives    Family history of hypertension and diabetes.  Prior to Admission medications   Medication Sig Start Date End Date Taking? Authorizing Provider  albuterol (PROVENTIL HFA;VENTOLIN HFA) 108 (90 Base) MCG/ACT inhaler Inhale 2 puffs into the lungs every 6 (six) hours as needed for wheezing or shortness of breath.   Yes [provider]  alendronate (FOSAMAX) 70 MG tablet Take 70 mg by mouth once a week. On Sunday   Yes [provider]  amLODipine (NORVASC) 10 MG tablet Take 10 mg by mouth daily.   Yes [provider]  aspirin EC 325 MG tablet Take 325 mg by mouth daily.   Yes [provider]  atorvastatin (LIPITOR) 20 MG tablet Take 20 mg by mouth daily.   Yes [provider]  beclomethasone (QVAR) 80 MCG/ACT inhaler Inhale 2 puffs into the lungs 2 (two) times daily.   Yes [provider]  Calcium-Vitamin D 600-200 MG-UNIT tablet Take 1 tablet by mouth daily.   Yes [provider]  doxazosin (CARDURA) 4 MG tablet Take 4 mg by mouth 2 (two) times daily.   Yes [provider]  glipiZIDE (GLUCOTROL) 10 MG tablet Take 10 mg by mouth 2 (two) times daily before a meal.   Yes [provider]  hydrochlorothiazide (MICROZIDE) 12.5 MG capsule Take 12.5 mg by mouth daily.   Yes [provider]  insulin NPH-regular Human (NOVOLIN 70/30) (70-30) 100 UNIT/ML injection Inject 18-22 Units into the skin See admin instructions. Use 22 units every morning and use 18 units before supper   Yes [provider]  letrozole (FEMARA) 2.5 MG tablet Take 2.5 mg by mouth daily.   Yes [provider]  lisinopril (PRINIVIL,ZESTRIL) 20 MG tablet Take 20 mg by mouth daily.   Yes [provider]  metFORMIN (GLUCOPHAGE) 1000 MG tablet Take 1,000 mg by mouth 2 (two) times daily with a meal.   Yes [provider]  metoprolol tartrate  (LOPRESSOR) 100 MG tablet Take 100 mg by mouth 2 (two) times daily.   Yes [provider]    Physical Exam: BP (!) 149/68   Pulse (!) 116   Temp (!) 100.6 F (38.1 C) (Oral)   Resp 16   Ht 5\' 3"  (1.6 m)   Wt 88 kg (194 lb)   SpO2 97%   BMI 34.37 kg/m   General: Older black female.. Awake and alert and oriented x3. No acute cardiopulmonary distress.  HEENT: Normocephalic atraumatic.  Right and left ears normal in appearance.  Pupils equal, round, reactive to light. Extraocular muscles are intact. Sclerae anicteric and noninjected.  Neck: Neck supple. Swelling of her right neck with lymphadenopathy. It is tender to palpation. No carotid bruits. No masses palpated.  Cardiovascular: Regular rate with normal S1-S2 sounds. No murmurs, rubs, gallops auscultated. No JVD.  Respiratory: Good respiratory effort with no wheezes, rales, rhonchi. Lungs clear to auscultation bilaterally.  No accessory muscle use. Abdomen: Obese. Soft, nontender, nondistended. Active bowel sounds. Unable to appreciate any masses or hepatosplenomegaly, although exam limited by body habitus. Skin: No rashes, lesions, or ulcerations.  Dry, warm to touch. 2+ dorsalis pedis and radial pulses. Musculoskeletal: No calf or leg pain. All major joints not erythematous nontender.  No upper or lower joint deformation.  Good ROM.  No contractures  Psychiatric: Intact judgment and insight. Pleasant and cooperative. Neurologic: No focal neurological deficits. Strength is 5/5 and symmetric in upper and lower extremities.  Cranial nerves II through XII are grossly intact.           Labs on Admission: I have personally reviewed following labs and imaging studies  CBC:  Recent Labs Lab 06/24/17 0321 06/24/17 0342  WBC 15.6*  --   NEUTROABS 13.7*  --   HGB 12.2 12.9  HCT 36.9 38.0  MCV 84.6  --   PLT 252  --    Basic Metabolic Panel:  Recent Labs Lab 06/24/17 0321 06/24/17 0342  NA 134* 138  K 2.8* 2.7*  CL  97* 96*  CO2 23  --   GLUCOSE 257* 265*  BUN 7 7  CREATININE 0.79 0.60  CALCIUM 9.2  --    GFR: Estimated Creatinine Clearance: 73.7 mL/min (by C-G formula based on SCr of 0.6 mg/dL). Liver Function Tests:  Recent Labs Lab 06/24/17 0321  AST 17  ALT 22  ALKPHOS 86  BILITOT 0.6  PROT 7.7  ALBUMIN 3.8   No results for input(s): LIPASE, AMYLASE in the last 168 hours. No results for input(s): AMMONIA in the last 168 hours. Coagulation Profile: No results for input(s): INR, PROTIME in the last 168 hours. Cardiac Enzymes: No results for input(s): CKTOTAL, CKMB, CKMBINDEX, TROPONINI in the last 168 hours. BNP (last 3 results) No results for input(s): PROBNP in the last 8760 hours. HbA1C: No results for input(s): HGBA1C in the last 72 hours. CBG: No results for input(s): GLUCAP in the last 168 hours. Lipid Profile: No results for input(s): CHOL, HDL, LDLCALC, TRIG, CHOLHDL, LDLDIRECT in the last 72 hours. Thyroid Function Tests: No results for input(s): TSH, T4TOTAL, FREET4, T3FREE,  THYROIDAB in the last 72 hours. Anemia Panel: No results for input(s): VITAMINB12, FOLATE, FERRITIN, TIBC, IRON, RETICCTPCT in the last 72 hours. Urine analysis: No results found for: COLORURINE, APPEARANCEUR, LABSPEC, PHURINE, GLUCOSEU, HGBUR, BILIRUBINUR, KETONESUR, PROTEINUR, UROBILINOGEN, NITRITE, LEUKOCYTESUR Sepsis Labs: @LABRCNTIP (procalcitonin:4,lacticidven:4) ) Recent Results (from the past 240 hour(s))  Rapid strep screen     Status: None   Collection Time: 06/24/17  1:38 AM  Result Value Ref Range Status   Streptococcus, Group A Screen (Direct) NEGATIVE NEGATIVE Final    Comment: (NOTE) A Rapid Antigen test may result negative if the antigen level in the sample is below the detection level of this test. The FDA has not cleared this test as a stand-alone test therefore the rapid antigen negative result has reflexed to a Group A Strep culture.      Radiological Exams on  Admission: Ct Soft Tissue Neck W Contrast  Result Date: 06/24/2017 CLINICAL DATA:  66 y/o  F; right neck swelling. EXAM: CT NECK WITH CONTRAST TECHNIQUE: Multidetector CT imaging of the neck was performed using the standard protocol following the bolus administration of intravenous contrast. CONTRAST:  65mL ISOVUE-300 IOPAMIDOL (ISOVUE-300) INJECTION 61% COMPARISON:  None. FINDINGS: Pharynx and larynx: Mucosal thickening of the right lateral oral and hypopharynx. Salivary glands: Dilatation of the right submandibular duct with probable 4 mm stone obstructing the distal duct (series 6, image 13). Other salivary glands are normal. Thyroid: Normal. Lymph nodes: Mild prominence of upper cervical lymph nodes and submandibular lymph nodes are likely reactive. Vascular: Moderate calcific atherosclerosis of carotid bifurcations without significant stenosis. Limited intracranial: Negative. Visualized orbits: Negative. Mastoids and visualized paranasal sinuses: Clear. Skeleton: No acute or aggressive process. Upper chest: Negative. Other: Inflammatory changes within the right greater than left submandibular and sublingual spaces, right parapharyngeal space, and subcutaneous fat of the upper neck. No discrete abscess is identified. IMPRESSION: 1. Dilatation of right submandibular duct with probable 4 mm obstructing sialolith within the anterior floor of mouth. The region of interest is directly obscured by streak artifact from dental hardware. 2. Inflammation within the right greater than left upper neck including submandibular spaces, sublingual spaces, and right parapharyngeal space. No discrete rim enhancing abscess is identified. These results were called by telephone at the time of interpretation on 06/24/2017 at 4:35 am to Dr. Ezequiel Essex , who verbally acknowledged these results. Electronically Signed   By: Kristine Garbe M.D.   On: 06/24/2017 04:41    Assessment/Plan: Principal Problem:   Cellulitis  of neck Active Problems:   Essential hypertension   Diabetes mellitus without complication (HCC)   Sialadenitis   Lactic acidosis   Salivary duct stone    This patient was discussed with the ED physician, including pertinent vitals, physical exam findings, labs, and imaging.  We also discussed care given by the ED provider.  #1 cellulitis of neck  Admit  Under new guidelines, patient does not meet sepsis criteria as her SOFA score is 0  Change antibiotics to Unasyn, which would give appropriate mouth flora coverage  Repeat CBC in the morning  Tylenol for fever  Toradol #2 salivatory stone   S/p removal #3 Sialadenitis  Management per ENT #4 diabetes type 2 without complication  CBGs before meals and daily at bedtime  We'll hold metformin due to IV contrast  We'll hold glipizide for now as patient is on full liquid diet  Continue long-acting insulin  Currently on full liquids - we'll not give any supplemental mealtime coverage #5 essential hypertension  Continue antihypertensives #6 lactic acidosis  Resolving  DVT prophylaxis: Lovenox Consultants: ENT Code Status: Full code Family Communication: Partner in the room  Disposition Plan: Admission to MedSurg   Truett Mainland DO [facility] Pager [phone]  If 7PM-7AM, please contact night-coverage [email or web address] Password [email or web address]

## 2017-06-25 ENCOUNTER — Encounter (HOSPITAL_COMMUNITY): Payer: Self-pay | Admitting: *Deleted

## 2017-06-25 DIAGNOSIS — L03221 Cellulitis of neck: Principal | ICD-10-CM

## 2017-06-25 LAB — CBC
HCT: 34 % — ABNORMAL LOW (ref 36.0–46.0)
Hemoglobin: 10.9 g/dL — ABNORMAL LOW (ref 12.0–15.0)
MCH: 27.7 pg (ref 26.0–34.0)
MCHC: 32.1 g/dL (ref 30.0–36.0)
MCV: 86.5 fL (ref 78.0–100.0)
PLATELETS: 250 10*3/uL (ref 150–400)
RBC: 3.93 MIL/uL (ref 3.87–5.11)
RDW: 13.2 % (ref 11.5–15.5)
WBC: 10.9 10*3/uL — AB (ref 4.0–10.5)

## 2017-06-25 LAB — BASIC METABOLIC PANEL
Anion gap: 9 (ref 5–15)
BUN: 9 mg/dL (ref 6–20)
CALCIUM: 8.2 mg/dL — AB (ref 8.9–10.3)
CO2: 22 mmol/L (ref 22–32)
CREATININE: 0.81 mg/dL (ref 0.44–1.00)
Chloride: 106 mmol/L (ref 101–111)
GFR calc Af Amer: >60 mL/min (ref >60)
Glucose, Bld: 99 mg/dL (ref 65–99)
POTASSIUM: 2.9 mmol/L — AB (ref 3.5–5.1)
SODIUM: 137 mmol/L (ref 135–145)

## 2017-06-25 LAB — GLUCOSE, CAPILLARY
GLUCOSE-CAPILLARY: 103 mg/dL — AB (ref 65–99)
GLUCOSE-CAPILLARY: 70 mg/dL (ref 65–99)
Glucose-Capillary: 138 mg/dL — ABNORMAL HIGH (ref 65–99)
Glucose-Capillary: 372 mg/dL — ABNORMAL HIGH (ref 65–99)

## 2017-06-25 LAB — MAGNESIUM: MAGNESIUM: 1.5 mg/dL — AB (ref 1.7–2.4)

## 2017-06-25 MED ORDER — POTASSIUM CHLORIDE IN NACL 40-0.9 MEQ/L-% IV SOLN
INTRAVENOUS | Status: DC
  Administered 2017-06-25: 75 mL/h via INTRAVENOUS
  Filled 2017-06-25 (×2): qty 1000

## 2017-06-25 MED ORDER — POTASSIUM CHLORIDE CRYS ER 20 MEQ PO TBCR
40.0000 meq | EXTENDED_RELEASE_TABLET | ORAL | Status: AC
  Administered 2017-06-25 (×2): 40 meq via ORAL
  Filled 2017-06-25 (×2): qty 2

## 2017-06-25 MED ORDER — POTASSIUM CHLORIDE IN NACL 40-0.9 MEQ/L-% IV SOLN
INTRAVENOUS | Status: DC
  Administered 2017-06-26: 75 mL/h via INTRAVENOUS
  Filled 2017-06-25: qty 1000

## 2017-06-25 MED ORDER — DEXAMETHASONE SODIUM PHOSPHATE 10 MG/ML IJ SOLN
10.0000 mg | Freq: Once | INTRAMUSCULAR | Status: AC
  Administered 2017-06-25: 10 mg via INTRAVENOUS
  Filled 2017-06-25: qty 1

## 2017-06-25 MED ORDER — MAGNESIUM SULFATE 2 GM/50ML IV SOLN
2.0000 g | Freq: Once | INTRAVENOUS | Status: AC
  Administered 2017-06-25: 2 g via INTRAVENOUS
  Filled 2017-06-25: qty 50

## 2017-06-25 NOTE — Progress Notes (Signed)
06/25/2017 9:04 AM  Carolann Littler [id number]  Post-Op Day 1    Temp:  [97.6 F (36.4 C)-99.8 F (37.7 C)] 99.6 F (37.6 C) (07/02 0609) Pulse Rate:  [75-106] 95 (07/02 0609) Resp:  [17-18] 17 (07/02 0609) BP: (126-153)/(67-76) 153/76 (07/02 0609) SpO2:  [94 %-97 %] 94 % (07/02 0609) Weight:  [89.3 kg (196 lb 12.8 oz)] 89.3 kg (196 lb 12.8 oz) (07/01 0943),     Intake/Output Summary (Last 24 hours) at 06/25/17 0904 Last data filed at 06/25/17 0650  Gross per 24 hour  Intake          2196.67 ml  Output             1100 ml  Net          1096.67 ml    Results for orders placed or performed during the hospital encounter of 06/24/17 (from the past 24 hour(s))  Glucose, capillary     Status: Abnormal   Collection Time: 06/24/17  9:50 AM  Result Value Ref Range   Glucose-Capillary 250 (H) 65 - 99 mg/dL   Comment 1 Notify RN   Glucose, capillary     Status: Abnormal   Collection Time: 06/24/17 11:59 AM  Result Value Ref Range   Glucose-Capillary 264 (H) 65 - 99 mg/dL   Comment 1 Notify RN   Glucose, capillary     Status: Abnormal   Collection Time: 06/24/17  5:10 PM  Result Value Ref Range   Glucose-Capillary 124 (H) 65 - 99 mg/dL  Glucose, capillary     Status: None   Collection Time: 06/24/17  9:07 PM  Result Value Ref Range   Glucose-Capillary 83 65 - 99 mg/dL  Basic metabolic panel     Status: Abnormal   Collection Time: 06/25/17  6:32 AM  Result Value Ref Range   Sodium 137 135 - 145 mmol/L   Potassium 2.9 (L) 3.5 - 5.1 mmol/L   Chloride 106 101 - 111 mmol/L   CO2 22 22 - 32 mmol/L   Glucose, Bld 99 65 - 99 mg/dL   BUN 9 6 - 20 mg/dL   Creatinine, Ser 0.81 0.44 - 1.00 mg/dL   Calcium 8.2 (L) 8.9 - 10.3 mg/dL   GFR calc non Af Amer >60 >60 mL/min   GFR calc Af Amer >60 >60 mL/min   Anion gap 9 5 - 15  CBC     Status: Abnormal   Collection Time: 06/25/17  6:32 AM  Result Value Ref Range   WBC 10.9 (H) 4.0 - 10.5 K/uL   RBC 3.93 3.87 - 5.11 MIL/uL   Hemoglobin 10.9 (L) 12.0 - 15.0 g/dL   HCT 34.0 (L) 36.0 - 46.0 %   MCV 86.5 78.0 - 100.0 fL   MCH 27.7 26.0 - 34.0 pg   MCHC 32.1 30.0 - 36.0 g/dL   RDW 13.2 11.5 - 15.5 %   Platelets 250 150 - 400 K/uL  Glucose, capillary     Status: Abnormal   Collection Time: 06/25/17  7:45 AM  Result Value Ref Range   Glucose-Capillary 103 (H) 65 - 99 mg/dL    SUBJECTIVE:  Less pain and neck swelling.  Eating, drinking, breathing, ambulating, voiding.  OBJECTIVE:  FOM healing.  Neck still some tender  IMPRESSION:  Improved.  PLAN:  Po abx, recheck my office 3-4 wks, sooner as needed.  Jodi Marble

## 2017-06-25 NOTE — Care Management Note (Signed)
Case Management Note  Patient Details  Name: Beth Blankenship MRN: [id number] Date of Birth: 12/19/1951  Subjective/Objective:  Pt admitted on 06/24/17 with RIGHT submandibular obstructing stone with sialadenitis and neck cellulitis.  PTA, pt independent, lives at home with spouse.                    Action/Plan: Will follow for discharge planning as pt progresses.    Expected Discharge Date:                  Expected Discharge Plan:  Home/Self Care  In-House Referral:     Discharge planning Services  CM Consult  Post Acute Care Choice:    Choice offered to:     DME Arranged:    DME Agency:     HH Arranged:    HH Agency:     Status of Service:  In process, will continue to follow  If discussed at Long Length of Stay Meetings, dates discussed:    Additional Comments:  Reinaldo Raddle, RN, BSN  Trauma/Neuro ICU Case Manager [phone]

## 2017-06-25 NOTE — Progress Notes (Signed)
PROGRESS NOTE        PATIENT DETAILS Name: Beth Blankenship Age: 66 y.o. Sex: female Date of Birth: July 28, 1951 Admit Date: 06/24/2017 Admitting Physician Truett Mainland, DO [id number], Pcp Not In  Brief Narrative: Patient is a 66 y.o. female with past medical history of type II Diabetes, hypertension, breast cancer s/p mastectomy and reconstructive surgery and on an aromatase inhibitor. She presented to the ED yesterday for throat pain and difficulty swallowing. She was admitted for suspected neck abscess and placed on empiric antibiotics. CT was performed and she was found to have a right submandibular obstructing stone with sialadenitis and neck cellulitis. ENT was consulted, they excised the stone and drained the area.  Subjective: Patient sitting in chair comfortably without complaints. States pain in minor and she is able to swallow much more easily. Denies chest pain, shortness of breath or abdominal pain.   Assessment/Plan: Sialadenitis with Salivary duct stone: S/p stone excision, patient able to swallow liquids without pain, afebrile. - Advance to full liquid diet - Blood cultures pending - Leukocytosis trending down, continue unasyn - Will give dexamethasone x1 for inflammation - Continue chlorhexidine mouth wash  Hypokalemia: Potassium has been <3 since admission, patient denies muscle pain/weakness or chest pain.  - Check magnesium, slightly low at 1.5. Replenish Mg. - Stop NS, start NaCl with KCl - Give KCl one 40 mg tablet x2  Essential hypertension: BP stable, continue HCTZ, metoprolol, amlodipine and lisinopril. Continue to monitor.  Diabetes mellitus without complication: CBG stable, continue SSI. Continue to monitor.  DVT Prophylaxis: Prophylactic Lovenox Code Status: Full Family Communication: Spouse at bedside Disposition Plan: Home tomorrow hopefully  Antimicrobial agents: Anti-infectives    Start     Dose/Rate Route Frequency  Ordered Stop   06/24/17 1515  Ampicillin-Sulbactam (UNASYN) 3 g in sodium chloride 0.9 % 100 mL IVPB     3 g 200 mL/hr over 30 Minutes Intravenous Every 8 hours 06/24/17 1513     06/24/17 1200  vancomycin (VANCOCIN) IVPB 1000 mg/200 mL premix  Status:  Discontinued     1,000 mg 200 mL/hr over 60 Minutes Intravenous Every 12 hours 06/24/17 0355 06/24/17 0718   06/24/17 1100  piperacillin-tazobactam (ZOSYN) IVPB 3.375 g  Status:  Discontinued     3.375 g 12.5 mL/hr over 240 Minutes Intravenous Every 8 hours 06/24/17 0356 06/24/17 0718   06/24/17 0315  piperacillin-tazobactam (ZOSYN) IVPB 3.375 g     3.375 g 100 mL/hr over 30 Minutes Intravenous  Once 06/24/17 0309 06/24/17 0428   06/24/17 0315  vancomycin (VANCOCIN) IVPB 1000 mg/200 mL premix     1,000 mg 200 mL/hr over 60 Minutes Intravenous  Once 06/24/17 0309 06/24/17 0457      Procedures: Sialolithiasis removal 06/24/2017  CONSULTS: ENT  Time spent: 25 minutes-Greater than 50% of this time was spent in counseling, explanation of diagnosis, planning of further management, and coordination of care.  MEDICATIONS: Scheduled Meds: . amLODipine  10 mg Oral Daily  . aspirin EC  325 mg Oral Daily  . atorvastatin  20 mg Oral Daily  . chlorhexidine  5 mL Mouth/Throat QID  . doxazosin  4 mg Oral BID  . enoxaparin (LOVENOX) injection  40 mg Subcutaneous Q24H  . hydrochlorothiazide  12.5 mg Oral Daily  . insulin aspart  0-15 Units Subcutaneous TID WC  . insulin aspart  0-5  Units Subcutaneous QHS  . insulin aspart protamine- aspart  18 Units Subcutaneous Q supper  . insulin aspart protamine- aspart  22 Units Subcutaneous Q breakfast  . letrozole  2.5 mg Oral Daily  . lisinopril  20 mg Oral Daily  . metoprolol tartrate  100 mg Oral BID  . potassium chloride  40 mEq Oral Q4H   Continuous Infusions: . 0.9 % NaCl with KCl 40 mEq / L    . ampicillin-sulbactam (UNASYN) IV Stopped (06/25/17 0714)   PRN Meds:.acetaminophen **OR**  acetaminophen, albuterol, bisacodyl, HYDROcodone-acetaminophen, ketorolac   PHYSICAL EXAM: Vital signs: Vitals:   06/24/17 1334 06/24/17 2058 06/24/17 2123 06/25/17 0609  BP: 126/70  138/70 (!) 153/76  Pulse: 75  83 95  Resp: 18  18 17   Temp: 97.6 F (36.4 C)  98.6 F (37 C) 99.6 F (37.6 C)  TempSrc: Axillary  Oral Oral  SpO2: 96% 97% 95% 94%  Weight:      Height:       Filed Weights   06/24/17 0132 06/24/17 0943  Weight: 88 kg (194 lb) 89.3 kg (196 lb 12.8 oz)   Body mass index is 34.86 kg/m.   General appearance: Awake, alert, not in any distress. Non toxic appearing  Mouth: pus draining from incision site, area is erythematous. Submandibular region is TTP, area is firm. Neck: swallowing appropriately, right side TTP and slightly swollen. No erythema noted. Resp: CTA bilaterally, no added sounds  CVS: S1 S2 regular, no murmurs.  GI: Bowel sounds present, No TTP and nondistended Extremities: B/L Lower Ext shows no edema, both legs are warm to touch  Neurology: speech clear, No focal neuro deficits. Psychiatric: Normal judgment and insight. Alert and oriented. Normal mood.  I have personally reviewed following labs and imaging studies  LABORATORY DATA: CBC:  Recent Labs Lab 06/24/17 0321 06/24/17 0342 06/25/17 0632  WBC 15.6*  --  10.9*  NEUTROABS 13.7*  --   --   HGB 12.2 12.9 10.9*  HCT 36.9 38.0 34.0*  MCV 84.6  --  86.5  PLT 252  --  170    Basic Metabolic Panel:  Recent Labs Lab 06/24/17 0321 06/24/17 0342 06/25/17 0632  NA 134* 138 137  K 2.8* 2.7* 2.9*  CL 97* 96* 106  CO2 23  --  22  GLUCOSE 257* 265* 99  BUN 7 7 9   CREATININE 0.79 0.60 0.81  CALCIUM 9.2  --  8.2*  MG  --   --  1.5*    GFR: Estimated Creatinine Clearance: 73.5 mL/min (by C-G formula based on SCr of 0.81 mg/dL).  Liver Function Tests:  Recent Labs Lab 06/24/17 0321  AST 17  ALT 22  ALKPHOS 86  BILITOT 0.6  PROT 7.7  ALBUMIN 3.8   No results for input(s):  LIPASE, AMYLASE in the last 168 hours. No results for input(s): AMMONIA in the last 168 hours.  Coagulation Profile: No results for input(s): INR, PROTIME in the last 168 hours.  Cardiac Enzymes: No results for input(s): CKTOTAL, CKMB, CKMBINDEX, TROPONINI in the last 168 hours.  BNP (last 3 results) No results for input(s): PROBNP in the last 8760 hours.  HbA1C: No results for input(s): HGBA1C in the last 72 hours.  CBG:  Recent Labs Lab 06/24/17 0950 06/24/17 1159 06/24/17 1710 06/24/17 2107 06/25/17 0745  GLUCAP 250* 264* 124* 83 103*    Lipid Profile: No results for input(s): CHOL, HDL, LDLCALC, TRIG, CHOLHDL, LDLDIRECT in the last 72 hours.  Thyroid Function Tests: No results for input(s): TSH, T4TOTAL, FREET4, T3FREE, THYROIDAB in the last 72 hours.  Anemia Panel: No results for input(s): VITAMINB12, FOLATE, FERRITIN, TIBC, IRON, RETICCTPCT in the last 72 hours.  Urine analysis: No results found for: COLORURINE, APPEARANCEUR, LABSPEC, PHURINE, GLUCOSEU, HGBUR, BILIRUBINUR, KETONESUR, PROTEINUR, UROBILINOGEN, NITRITE, LEUKOCYTESUR  Sepsis Labs: Lactic Acid, Venous    Component Value Date/Time   LATICACIDVEN 1.94 ([place]) 06/24/2017 0620    MICROBIOLOGY: Recent Results (from the past 240 hour(s))  Rapid strep screen     Status: None   Collection Time: 06/24/17  1:38 AM  Result Value Ref Range Status   Streptococcus, Group A Screen (Direct) NEGATIVE NEGATIVE Final    Comment: (NOTE) A Rapid Antigen test may result negative if the antigen level in the sample is below the detection level of this test. The FDA has not cleared this test as a stand-alone test therefore the rapid antigen negative result has reflexed to a Group A Strep culture.     RADIOLOGY STUDIES/RESULTS: Ct Soft Tissue Neck W Contrast  Result Date: 06/24/2017 CLINICAL DATA:  66 y/o  F; right neck swelling. EXAM: CT NECK WITH CONTRAST TECHNIQUE: Multidetector CT imaging of the neck was  performed using the standard protocol following the bolus administration of intravenous contrast. CONTRAST:  65mL ISOVUE-300 IOPAMIDOL (ISOVUE-300) INJECTION 61% COMPARISON:  None. FINDINGS: Pharynx and larynx: Mucosal thickening of the right lateral oral and hypopharynx. Salivary glands: Dilatation of the right submandibular duct with probable 4 mm stone obstructing the distal duct (series 6, image 13). Other salivary glands are normal. Thyroid: Normal. Lymph nodes: Mild prominence of upper cervical lymph nodes and submandibular lymph nodes are likely reactive. Vascular: Moderate calcific atherosclerosis of carotid bifurcations without significant stenosis. Limited intracranial: Negative. Visualized orbits: Negative. Mastoids and visualized paranasal sinuses: Clear. Skeleton: No acute or aggressive process. Upper chest: Negative. Other: Inflammatory changes within the right greater than left submandibular and sublingual spaces, right parapharyngeal space, and subcutaneous fat of the upper neck. No discrete abscess is identified. IMPRESSION: 1. Dilatation of right submandibular duct with probable 4 mm obstructing sialolith within the anterior floor of mouth. The region of interest is directly obscured by streak artifact from dental hardware. 2. Inflammation within the right greater than left upper neck including submandibular spaces, sublingual spaces, and right parapharyngeal space. No discrete rim enhancing abscess is identified. These results were called by telephone at the time of interpretation on 06/24/2017 at 4:35 am to Dr. Ezequiel Essex , who verbally acknowledged these results. Electronically Signed   By: Kristine Garbe M.D.   On: 06/24/2017 04:41     LOS: 1 day   Kathleene Hazel, PA-S  [facility]  Attending MD note  Patient was seen, examined,treatment plan was discussed with the PA-S Kathleene Hazel,.  I have personally reviewed the clinical findings, lab, imaging studies  and management of this patient in detail. I agree with the documentation, as recorded by the PA-S and changes to above note were in bold green  Patient seen and examined severity when the PA student. Patient reported she is doing much better, pain has improved, able to tolerate some liquids this morning. Afebrile, white count improving. Mild tender when swallowing.  On Exam: Gen. exam: Awake, alert, not in any distress HEENT: Tender LAD on the right side. Mild erythema in OP Chest: Good air entry bilaterally, no rhonchi or rales CVS: S1-S2 regular, no murmurs Abdomen: Soft, nontender and nondistended Neurology: Non-focal Skin: No rash or lesions  Impression: Cellulitis of neck  Right submandibular obstructing stone with sialoadenitis Type 2 diabetes mellitus Essential hypertension  Plan: We'll continue IV antibiotics for the next 24 hours. One dose of Decadron given for anti-inflammatory purposes Advance diet as tolerated If continued to be stable discharge in the morning with oral antibiotics.   Rest as above  Chipper Oman, MD    If 7PM-7AM, please contact night-coverage [email or web address] Password [email or web address] 06/25/2017, 10:57 AM

## 2017-06-26 DIAGNOSIS — E119 Type 2 diabetes mellitus without complications: Secondary | ICD-10-CM

## 2017-06-26 DIAGNOSIS — C801 Malignant (primary) neoplasm, unspecified: Secondary | ICD-10-CM

## 2017-06-26 DIAGNOSIS — I10 Essential (primary) hypertension: Secondary | ICD-10-CM

## 2017-06-26 DIAGNOSIS — E876 Hypokalemia: Secondary | ICD-10-CM

## 2017-06-26 DIAGNOSIS — K115 Sialolithiasis: Secondary | ICD-10-CM

## 2017-06-26 DIAGNOSIS — K1120 Sialoadenitis, unspecified: Secondary | ICD-10-CM

## 2017-06-26 LAB — CBC
HCT: 33.4 % — ABNORMAL LOW (ref 36.0–46.0)
Hemoglobin: 10.7 g/dL — ABNORMAL LOW (ref 12.0–15.0)
MCH: 27.7 pg (ref 26.0–34.0)
MCHC: 32 g/dL (ref 30.0–36.0)
MCV: 86.5 fL (ref 78.0–100.0)
PLATELETS: 265 10*3/uL (ref 150–400)
RBC: 3.86 MIL/uL — AB (ref 3.87–5.11)
RDW: 13 % (ref 11.5–15.5)
WBC: 11.2 10*3/uL — ABNORMAL HIGH (ref 4.0–10.5)

## 2017-06-26 LAB — CULTURE, GROUP A STREP (THRC)

## 2017-06-26 LAB — BASIC METABOLIC PANEL
Anion gap: 11 (ref 5–15)
BUN: 9 mg/dL (ref 6–20)
CHLORIDE: 107 mmol/L (ref 101–111)
CO2: 20 mmol/L — AB (ref 22–32)
CREATININE: 0.7 mg/dL (ref 0.44–1.00)
Calcium: 8.7 mg/dL — ABNORMAL LOW (ref 8.9–10.3)
GFR calc Af Amer: >60 mL/min (ref >60)
GFR calc non Af Amer: >60 mL/min (ref >60)
Glucose, Bld: 228 mg/dL — ABNORMAL HIGH (ref 65–99)
Potassium: 4.2 mmol/L (ref 3.5–5.1)
Sodium: 138 mmol/L (ref 135–145)

## 2017-06-26 LAB — MAGNESIUM: Magnesium: 1.9 mg/dL (ref 1.7–2.4)

## 2017-06-26 LAB — GLUCOSE, CAPILLARY: Glucose-Capillary: 211 mg/dL — ABNORMAL HIGH (ref 65–99)

## 2017-06-26 MED ORDER — AMOXICILLIN-POT CLAVULANATE 875-125 MG PO TABS: 1.0000 | ORAL_TABLET | Freq: Two times a day (BID) | ORAL | 0 refills | Status: AC

## 2017-06-26 NOTE — Progress Notes (Signed)
Discharge home. Home discharge instruction given, no question verbalized. 

## 2017-06-26 NOTE — Discharge Summary (Signed)
Physician Discharge Summary  Beth Blankenship  IWO:[id number]  DOB: July 14, 1951  DOA: 06/24/2017 PCP: System, Pcp Not In  Admit date: 06/24/2017 Discharge date: 06/26/2017  Admitted From: Home  Disposition:  Home   Recommendations for Outpatient Follow-up:  1. Follow up with PCP in 1-2 weeks  2. Please obtain BMP/CBC in one week to monitor renal function and Hbg 3. Please follow up on the following pending results: Blood cultures  4. Follow up with Dr Erik Obey (ENT) 3-4 weeks, soon as needed   Discharge Condition: Stable CODE STATUS: FULL  Diet recommendation: Heart Healthy  Brief/Interim Summary: 66 y/o female with PMHx of DM type II, HTN, breast cancer survival s/p radical mastectomy or aromatase inhibitor. Presented to the ED c/o sore throat and difficulty swallowing. She was admitted with suspected neck abscess started on IV abx and CT neck was performed which found to have r submandibular obstructing stone with sialadenitis and neck cellulitis.  ENT was consulted, with excision of the stone. Patient was given a dose of Decadron. Patient subsequently improved, tolerated diet w/o swallowing difficulties. Patient afebrile, pain free and clinically improved. She will be discharge to follow up with PCP and ENT as outpatient.  Subjective: Patient seen and examined on the day of discharge, she is having breakfast, has no complaints. No difficulty swallowing. Denies SOB, chest pain and palpitations.   Discharge Diagnoses/Hospital Course:  Cellulitis of neck  Initially treated with IV Unasyn, transitioned to Augmentin on the day of discharge  IV decadron x 1 given  Complete 8 days of Augmentin  Patient does not have a PCP as she moved here from [place]. Case manager consulted prior to discharge.    Right submandibular obstructing stone with sialoadenitis. S/p excision by ENT  Follow up in 3-4 weeks   Type 2 diabetes mellitus  CBG's slight elevated given her home medications were  held Resume home glipizide, Metformin and NPH  Monitor finger stick glucose, goal < 140 fasting   Essential hypertension BP stable during hospital stay  No changes in medications were made  Continue Lisinopril, Norvasc, Metoprolol and HCTZ  Monitor BP periodically.   Hx of breast cancer  On Femara  Patient need to establish care with Oncology for follow up  Advised to call cancer center for appointment    Hypokalemia  Repleted with IV fluids Resolved  Check BMP in 1-2 weeks   All other chronic medical condition were stable during the hospitalization.  On the day of the discharge the patient's vitals were stable, and no other acute medical condition were reported by patient. Patient was felt safe to be discharge to home  Discharge Instructions  You were cared for by a hospitalist during your hospital stay. If you have any questions about your discharge medications or the care you received while you were in the hospital after you are discharged, you can call the unit and asked to speak with the hospitalist on call if the hospitalist that took care of you is not available. Once you are discharged, your primary care physician will handle any further medical issues. Please note that NO REFILLS for any discharge medications will be authorized once you are discharged, as it is imperative that you return to your primary care physician (or establish a relationship with a primary care physician if you do not have one) for your aftercare needs so that they can reassess your need for medications and monitor your lab values.  Discharge Instructions    Call MD for:  difficulty breathing, headache or visual disturbances    Complete by:  As directed    Call MD for:  extreme fatigue    Complete by:  As directed    Call MD for:  hives    Complete by:  As directed    Call MD for:  persistant dizziness or light-headedness    Complete by:  As directed    Call MD for:  persistant nausea and vomiting     Complete by:  As directed    Call MD for:  redness, tenderness, or signs of infection (pain, swelling, redness, odor or green/yellow discharge around incision site)    Complete by:  As directed    Call MD for:  severe uncontrolled pain    Complete by:  As directed    Call MD for:  temperature >100.4    Complete by:  As directed    Diet - low sodium heart healthy    Complete by:  As directed    Increase activity slowly    Complete by:  As directed      Allergies as of 06/26/2017      Reactions   Latex Hives   Tetracyclines & Related Hives      Medication List    TAKE these medications   albuterol 108 (90 Base) MCG/ACT inhaler Commonly known as:  PROVENTIL HFA;VENTOLIN HFA Inhale 2 puffs into the lungs every 6 (six) hours as needed for wheezing or shortness of breath.   alendronate 70 MG tablet Commonly known as:  FOSAMAX Take 70 mg by mouth once a week. On Sunday   amLODipine 10 MG tablet Commonly known as:  NORVASC Take 10 mg by mouth daily.   amoxicillin-clavulanate 875-125 MG tablet Commonly known as:  AUGMENTIN Take 1 tablet by mouth 2 (two) times daily.   aspirin EC 325 MG tablet Take 325 mg by mouth daily.   atorvastatin 20 MG tablet Commonly known as:  LIPITOR Take 20 mg by mouth daily.   beclomethasone 80 MCG/ACT inhaler Commonly known as:  QVAR Inhale 2 puffs into the lungs 2 (two) times daily.   Calcium-Vitamin D 600-200 MG-UNIT tablet Take 1 tablet by mouth daily.   doxazosin 4 MG tablet Commonly known as:  CARDURA Take 4 mg by mouth 2 (two) times daily.   glipiZIDE 10 MG tablet Commonly known as:  GLUCOTROL Take 10 mg by mouth 2 (two) times daily before a meal.   hydrochlorothiazide 12.5 MG capsule Commonly known as:  MICROZIDE Take 12.5 mg by mouth daily.   insulin NPH-regular Human (70-30) 100 UNIT/ML injection Commonly known as:  NOVOLIN 70/30 Inject 18-22 Units into the skin See admin instructions. Use 22 units every morning and use 18  units before supper   letrozole 2.5 MG tablet Commonly known as:  FEMARA Take 2.5 mg by mouth daily.   lisinopril 20 MG tablet Commonly known as:  PRINIVIL,ZESTRIL Take 20 mg by mouth daily.   metFORMIN 1000 MG tablet Commonly known as:  GLUCOPHAGE Take 1,000 mg by mouth 2 (two) times daily with a meal.   metoprolol tartrate 100 MG tablet Commonly known as:  LOPRESSOR Take 100 mg by mouth 2 (two) times daily.      Follow-up Information    Ladell Pier, MD. Schedule an appointment as soon as possible for a visit in 2 week(s).   Specialty:  Oncology Why:  Establish care  Contact information: [place] [address] [phone]  Jodi Marble, MD. Schedule an appointment as soon as possible for a visit in 3 week(s).   Specialty:  Otolaryngology Why:  Hospital follow up  Contact information: [address] [place] [address] [phone]          Allergies  Allergen Reactions  . Latex Hives  . Tetracyclines & Related Hives    Consultations: ENT   Procedures/Studies: Ct Soft Tissue Neck W Contrast  Result Date: 06/24/2017 CLINICAL DATA:  66 y/o  F; right neck swelling. EXAM: CT NECK WITH CONTRAST TECHNIQUE: Multidetector CT imaging of the neck was performed using the standard protocol following the bolus administration of intravenous contrast. CONTRAST:  64mL ISOVUE-300 IOPAMIDOL (ISOVUE-300) INJECTION 61% COMPARISON:  None. FINDINGS: Pharynx and larynx: Mucosal thickening of the right lateral oral and hypopharynx. Salivary glands: Dilatation of the right submandibular duct with probable 4 mm stone obstructing the distal duct (series 6, image 13). Other salivary glands are normal. Thyroid: Normal. Lymph nodes: Mild prominence of upper cervical lymph nodes and submandibular lymph nodes are likely reactive. Vascular: Moderate calcific atherosclerosis of carotid bifurcations without significant stenosis. Limited  intracranial: Negative. Visualized orbits: Negative. Mastoids and visualized paranasal sinuses: Clear. Skeleton: No acute or aggressive process. Upper chest: Negative. Other: Inflammatory changes within the right greater than left submandibular and sublingual spaces, right parapharyngeal space, and subcutaneous fat of the upper neck. No discrete abscess is identified. IMPRESSION: 1. Dilatation of right submandibular duct with probable 4 mm obstructing sialolith within the anterior floor of mouth. The region of interest is directly obscured by streak artifact from dental hardware. 2. Inflammation within the right greater than left upper neck including submandibular spaces, sublingual spaces, and right parapharyngeal space. No discrete rim enhancing abscess is identified. These results were called by telephone at the time of interpretation on 06/24/2017 at 4:35 am to Dr. Ezequiel Essex , who verbally acknowledged these results. Electronically Signed   By: Kristine Garbe M.D.   On: 06/24/2017 04:41    Discharge Exam: Vitals:   06/25/17 2058 06/26/17 0600  BP: 138/74 137/72  Pulse: 94 77  Resp: 17 18  Temp: 99 F (37.2 C) 98.5 F (36.9 C)   Vitals:   06/25/17 0609 06/25/17 1350 06/25/17 2058 06/26/17 0600  BP: (!) 153/76 135/67 138/74 137/72  Pulse: 95 79 94 77  Resp: 17 17 17 18   Temp: 99.6 F (37.6 C) 98.7 F (37.1 C) 99 F (37.2 C) 98.5 F (36.9 C)  TempSrc: Oral Oral Oral Oral  SpO2: 94% 94% 99% 97%  Weight:      Height:        General: Pt is alert, awake, not in acute distress Neck: Mild tenderness, no pain with swallowing, trachea midline  Cardiovascular: RRR, S1/S2 +, no rubs, no gallops Respiratory: CTA bilaterally, no wheezing, no rhonchi Abdominal: Soft, NT, ND, bowel sounds + Extremities: no edema, no cyanosis   The results of significant diagnostics from this hospitalization (including imaging, microbiology, ancillary and laboratory) are listed below for  reference.     Microbiology: Recent Results (from the past 240 hour(s))  Rapid strep screen     Status: None   Collection Time: 06/24/17  1:38 AM  Result Value Ref Range Status   Streptococcus, Group A Screen (Direct) NEGATIVE NEGATIVE Final    Comment: (NOTE) A Rapid Antigen test may result negative if the antigen level in the sample is below the detection level of this test. The FDA has not cleared this test  as a stand-alone test therefore the rapid antigen negative result has reflexed to a Group A Strep culture.   Culture, group A strep     Status: None (Preliminary result)   Collection Time: 06/24/17  1:38 AM  Result Value Ref Range Status   Specimen Description THROAT  Final   Special Requests NONE Reflexed from [id number]  Final   Culture CULTURE REINCUBATED FOR BETTER GROWTH  Final   Report Status PENDING  Incomplete  Blood culture (routine x 2)     Status: None (Preliminary result)   Collection Time: 06/24/17  3:30 AM  Result Value Ref Range Status   Specimen Description BLOOD LEFT HAND  Final   Special Requests   Final    BOTTLES DRAWN AEROBIC AND ANAEROBIC Blood Culture adequate volume   Culture NO GROWTH 1 DAY  Final   Report Status PENDING  Incomplete  Blood culture (routine x 2)     Status: None (Preliminary result)   Collection Time: 06/24/17  3:35 AM  Result Value Ref Range Status   Specimen Description BLOOD RIGHT ANTECUBITAL  Final   Special Requests   Final    BOTTLES DRAWN AEROBIC AND ANAEROBIC Blood Culture adequate volume   Culture NO GROWTH 1 DAY  Final   Report Status PENDING  Incomplete     Labs: BNP (last 3 results) No results for input(s): BNP in the last 8760 hours. Basic Metabolic Panel:  Recent Labs Lab 06/24/17 0321 06/24/17 0342 06/25/17 0632 06/26/17 0800  NA 134* 138 137 138  K 2.8* 2.7* 2.9* 4.2  CL 97* 96* 106 107  CO2 23  --  22 20*  GLUCOSE 257* 265* 99 228*  BUN 7 7 9 9   CREATININE 0.79 0.60 0.81 0.70  CALCIUM 9.2  --  8.2*  8.7*  MG  --   --  1.5* 1.9   Liver Function Tests:  Recent Labs Lab 06/24/17 0321  AST 17  ALT 22  ALKPHOS 86  BILITOT 0.6  PROT 7.7  ALBUMIN 3.8   No results for input(s): LIPASE, AMYLASE in the last 168 hours. No results for input(s): AMMONIA in the last 168 hours. CBC:  Recent Labs Lab 06/24/17 0321 06/24/17 0342 06/25/17 0632 06/26/17 0446  WBC 15.6*  --  10.9* 11.2*  NEUTROABS 13.7*  --   --   --   HGB 12.2 12.9 10.9* 10.7*  HCT 36.9 38.0 34.0* 33.4*  MCV 84.6  --  86.5 86.5  PLT 252  --  250 265   Cardiac Enzymes: No results for input(s): CKTOTAL, CKMB, CKMBINDEX, TROPONINI in the last 168 hours. BNP: Invalid input(s): POCBNP CBG:  Recent Labs Lab 06/25/17 0745 06/25/17 1153 06/25/17 1702 06/25/17 2157 06/26/17 0735  GLUCAP 103* 70 138* 372* 211*   D-Dimer No results for input(s): DDIMER in the last 72 hours. Hgb A1c No results for input(s): HGBA1C in the last 72 hours. Lipid Profile No results for input(s): CHOL, HDL, LDLCALC, TRIG, CHOLHDL, LDLDIRECT in the last 72 hours. Thyroid function studies No results for input(s): TSH, T4TOTAL, T3FREE, THYROIDAB in the last 72 hours.  Invalid input(s): FREET3 Anemia work up No results for input(s): VITAMINB12, FOLATE, FERRITIN, TIBC, IRON, RETICCTPCT in the last 72 hours. Urinalysis No results found for: COLORURINE, APPEARANCEUR, [place], [place], GLUCOSEU, [place], [place], [place], PROTEINUR, UROBILINOGEN, NITRITE, LEUKOCYTESUR Sepsis Labs Invalid input(s): PROCALCITONIN,  WBC,  LACTICIDVEN Microbiology Recent Results (from the past 240 hour(s))  Rapid strep screen     Status:  None   Collection Time: 06/24/17  1:38 AM  Result Value Ref Range Status   Streptococcus, Group A Screen (Direct) NEGATIVE NEGATIVE Final    Comment: (NOTE) A Rapid Antigen test may result negative if the antigen level in the sample is below the detection level of this test. The FDA has not cleared this test as a  stand-alone test therefore the rapid antigen negative result has reflexed to a Group A Strep culture.   Culture, group A strep     Status: None (Preliminary result)   Collection Time: 06/24/17  1:38 AM  Result Value Ref Range Status   Specimen Description THROAT  Final   Special Requests NONE Reflexed from [id number]  Final   Culture CULTURE REINCUBATED FOR BETTER GROWTH  Final   Report Status PENDING  Incomplete  Blood culture (routine x 2)     Status: None (Preliminary result)   Collection Time: 06/24/17  3:30 AM  Result Value Ref Range Status   Specimen Description BLOOD LEFT HAND  Final   Special Requests   Final    BOTTLES DRAWN AEROBIC AND ANAEROBIC Blood Culture adequate volume   Culture NO GROWTH 1 DAY  Final   Report Status PENDING  Incomplete  Blood culture (routine x 2)     Status: None (Preliminary result)   Collection Time: 06/24/17  3:35 AM  Result Value Ref Range Status   Specimen Description BLOOD RIGHT ANTECUBITAL  Final   Special Requests   Final    BOTTLES DRAWN AEROBIC AND ANAEROBIC Blood Culture adequate volume   Culture NO GROWTH 1 DAY  Final   Report Status PENDING  Incomplete    Time coordinating discharge:  25 minutes  SIGNED:  Chipper Oman, MD  [facility] 06/26/2017, 10:57 AM  Pager please text page via  [email or web address] Password [email or web address]

## 2017-06-29 LAB — CULTURE, BLOOD (ROUTINE X 2)
CULTURE: NO GROWTH
Culture: NO GROWTH
SPECIAL REQUESTS: ADEQUATE
SPECIAL REQUESTS: ADEQUATE

## 2017-07-09 ENCOUNTER — Ambulatory Visit (INDEPENDENT_AMBULATORY_CARE_PROVIDER_SITE_OTHER): Payer: Medicare Other | Admitting: Family Medicine

## 2017-07-09 ENCOUNTER — Encounter: Payer: Self-pay | Admitting: Family Medicine

## 2017-07-09 VITALS — BP 136/80 | HR 76 | Resp 12 | Ht 63.0 in | Wt 198.2 lb

## 2017-07-09 DIAGNOSIS — I639 Cerebral infarction, unspecified: Secondary | ICD-10-CM

## 2017-07-09 DIAGNOSIS — E1165 Type 2 diabetes mellitus with hyperglycemia: Secondary | ICD-10-CM

## 2017-07-09 DIAGNOSIS — Z23 Encounter for immunization: Secondary | ICD-10-CM | POA: Diagnosis not present

## 2017-07-09 DIAGNOSIS — D649 Anemia, unspecified: Secondary | ICD-10-CM

## 2017-07-09 DIAGNOSIS — Z853 Personal history of malignant neoplasm of breast: Secondary | ICD-10-CM | POA: Diagnosis not present

## 2017-07-09 DIAGNOSIS — Z794 Long term (current) use of insulin: Secondary | ICD-10-CM

## 2017-07-09 DIAGNOSIS — J4520 Mild intermittent asthma, uncomplicated: Secondary | ICD-10-CM

## 2017-07-09 DIAGNOSIS — I10 Essential (primary) hypertension: Secondary | ICD-10-CM | POA: Diagnosis not present

## 2017-07-09 DIAGNOSIS — E119 Type 2 diabetes mellitus without complications: Secondary | ICD-10-CM | POA: Diagnosis not present

## 2017-07-09 MED ORDER — GLIPIZIDE 10 MG PO TABS: 10.0000 mg | ORAL_TABLET | Freq: Two times a day (BID) | ORAL | 2 refills | Status: DC

## 2017-07-09 MED ORDER — INSULIN NPH ISOPHANE & REGULAR (70-30) 100 UNIT/ML ~~LOC~~ SUSP: 18.0000 [IU] | SUBCUTANEOUS | 3 refills | Status: DC

## 2017-07-09 MED ORDER — ALENDRONATE SODIUM 70 MG PO TABS: 70.0000 mg | ORAL_TABLET | ORAL | 3 refills | Status: DC

## 2017-07-09 MED ORDER — BECLOMETHASONE DIPROPIONATE 80 MCG/ACT IN AERS: 2.0000 | INHALATION_SPRAY | Freq: Two times a day (BID) | RESPIRATORY_TRACT | 2 refills | Status: DC

## 2017-07-09 MED ORDER — METFORMIN HCL 1000 MG PO TABS: 1000.0000 mg | ORAL_TABLET | Freq: Two times a day (BID) | ORAL | 2 refills | Status: DC

## 2017-07-09 MED ORDER — METOPROLOL TARTRATE 100 MG PO TABS: 100.0000 mg | ORAL_TABLET | Freq: Two times a day (BID) | ORAL | 2 refills | Status: DC

## 2017-07-09 MED ORDER — ATORVASTATIN CALCIUM 20 MG PO TABS: 20.0000 mg | ORAL_TABLET | Freq: Every day | ORAL | 2 refills | Status: DC

## 2017-07-09 MED ORDER — AMLODIPINE BESYLATE 10 MG PO TABS: 10.0000 mg | ORAL_TABLET | Freq: Every day | ORAL | 2 refills | Status: DC

## 2017-07-09 MED ORDER — LETROZOLE 2.5 MG PO TABS: 2.5000 mg | ORAL_TABLET | Freq: Every day | ORAL | 0 refills | Status: DC

## 2017-07-09 MED ORDER — DOXAZOSIN MESYLATE 4 MG PO TABS: 4.0000 mg | ORAL_TABLET | Freq: Two times a day (BID) | ORAL | 1 refills | Status: DC

## 2017-07-09 MED ORDER — HYDROCHLOROTHIAZIDE 12.5 MG PO CAPS: 12.5000 mg | ORAL_CAPSULE | Freq: Every day | ORAL | 1 refills | Status: DC

## 2017-07-09 MED ORDER — INSULIN PEN NEEDLE 30G X 5 MM MISC: 3 refills | Status: DC

## 2017-07-09 MED ORDER — LISINOPRIL 20 MG PO TABS: 20.0000 mg | ORAL_TABLET | Freq: Every day | ORAL | 1 refills | Status: DC

## 2017-07-09 NOTE — Patient Instructions (Signed)
A few things to remember from today's visit:   Essential hypertension - Plan: metoprolol tartrate (LOPRESSOR) 100 MG tablet, lisinopril (PRINIVIL,ZESTRIL) 20 MG tablet, doxazosin (CARDURA) 4 MG tablet, amLODipine (NORVASC) 10 MG tablet, hydrochlorothiazide (MICROZIDE) 12.5 MG capsule  History of breast cancer - Plan: letrozole (FEMARA) 2.5 MG tablet, alendronate (FOSAMAX) 70 MG tablet  Diabetes mellitus without complication (HCC) - Plan: Insulin Pen Needle 30G X 5 MM MISC, insulin NPH-regular Human (NOVOLIN 70/30) (70-30) 100 UNIT/ML injection, Hemoglobin A1c, Fructosamine, Microalbumin / creatinine urine ratio, metFORMIN (GLUCOPHAGE) 1000 MG tablet, glipiZIDE (GLUCOTROL) 10 MG tablet  Hypomagnesemia - Plan: Magnesium  Asthma, intermittent, uncomplicated   UGQ9V goal < 7.0. Avoid sugar added food:regular soft drinks, energy drinks, and sports drinks. candy. cakes. cookies. pies and cobblers. sweet rolls, pastries, and donuts. fruit drinks, such as fruitades and fruit punch. dairy desserts, such as ice cream  Mediterranean diet has showed benefits for sugar control.  How much and what type of carbohydrate foods are important for managing diabetes. The balance between how much insulin is in your body and the carbohydrate you eat makes a difference in your blood glucose levels.  Fasting blood sugar ideally 130 or less, 2 hours after meals less than 180.   Regular exercise also will help with controlling disease, daily brisk walking as tolerated for 15-30 min definitively will help.  No changes today, will adjust medications according to lab results.    We have ordered labs or studies at this visit.  It can take up to 1-2 weeks for results and processing. IF results require follow up or explanation, we will call you with instructions. Clinically stable results will be released to your [facility]. If you have not heard from [place] or cannot find your results in [facility] in 2 weeks please contact  our office at [phone].  If you are not yet signed up for [facility], please consider signing up  Avoid skipping meals, blood sugar might drop and cause serious problems. Remember checking feet periodically, good dental hygiene, and annual eye exam.       We have ordered labs or studies at this visit.  It can take up to 1-2 weeks for results and processing. IF results require follow up or explanation, we will call you with instructions. Clinically stable results will be released to your [facility]. If you have not heard from [place] or cannot find your results in [facility] in 2 weeks please contact our office at [phone].  If you are not yet signed up for [facility], please consider signing up   Please be sure medication list is accurate. If a new problem present, please set up appointment sooner than planned today.

## 2017-07-09 NOTE — Progress Notes (Signed)
HPI:   [name] is a 66 y.o. female, who is here today with her daughter to establish care.  Former PCP: Beth Blankenship. She just moved to this area  Last preventive routine visit: 1-2 years ago.   She lives with her husband and 2 daughters. Independent ADL's and most IADL's. She needs assistance for transportation, she does not drive. 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 No falls in the past year and denies depression symptoms.  Chronic medical problems: DM II,HTN,HLD,TIA, and breast cancer among some.  Diabetes Mellitus II:   Dx around 2007-2008. Currently on Novolin 70/30 22 U am and 18 U pm, Metformin 1000 mg twice daily, and glipizide 10 mg twice daily. Last eye exam within a year ago. Checking BS's : FG mainly and average 200"s.  Hypoglycemia: "Rarely" mid 50's, usually when she is sick.  She is tolerating medications well. She denies abdominal pain, nausea, vomiting, polydipsia, polyuria, or polyphagia. No numbness, tingling, or burning.  HTN:  Dx around 1973. Currently she is on Amlodipine 10 mg daily, Cardura 4 mg twice daily, HCTZ 12.5 mg daily, Metoprolol Tartrate 100 mg twice daily, and Lisinopril 20 mg daily Denies severe/frequent headache, visual changes, chest pain, dyspnea, palpitation, claudication, focal weakness, or edema.  Hx of TIA, she is on Aspirin 325 mg and Lipitor 20 mg daily.  Lab Results  Component Value Date   CREATININE 0.70 06/26/2017   BUN 9 06/26/2017   NA 138 06/26/2017   K 4.2 06/26/2017   CL 107 06/26/2017   CO2 20 (L) 06/26/2017   She does not exercise regularly and has not been consistent with a healthy diet.   Concerns today: Medication refill.  Hx of left breast cancer, s/p mastectomy and currently on Femara 2.5 mg daily. She needs a referral to oncologist, she has not had Femara in a few days. She tolerates medication well except for IP joint pain and stiffness of hands bilaterally. She is also to have  a mammogram.  She was recently hospitalized, from 06/24/2017 to 06/26/2017, she presented to the ER complaining of sore throat and dysphagia. She was found to have a submandibular obstructing stone with sialoadenitis and neck cellulitis. S/P submandibular gland excision of stone.She completed antibiotic treatment (Augmentin) and recovered completely.   HypoMg at 1.5  Lab Results  Component Value Date   WBC 11.2 (H) 06/26/2017   HGB 10.7 (L) 06/26/2017   HCT 33.4 (L) 06/26/2017   MCV 86.5 06/26/2017   PLT 265 06/26/2017   She denies blood in the stool or gross hematuria. She reports colonoscopy done in 2017, 10 years follow up was recommended.  Asthma: Currently she is on Qvar 80 g twice daily, she has not taken medication for about 2 weeks. She states that has not symptoms are worse during the fall and winter, mildly symptoms during spring. She uses Albuterol inhaler as needed. Currently she is not having any cough, wheezing, or dyspnea.    Review of Systems  Constitutional: Negative for activity change, appetite change, fatigue, fever and unexpected weight change.  HENT: Negative for mouth sores, nosebleeds and trouble swallowing.   Eyes: Negative for redness and visual disturbance.  Respiratory: Negative for cough, shortness of breath and wheezing.   Cardiovascular: Negative for chest pain, palpitations and leg swelling.  Gastrointestinal: Negative for abdominal pain, nausea and vomiting.       Negative for changes in bowel habits.  Endocrine: Negative for polydipsia, polyphagia and polyuria.  Genitourinary: Negative for decreased urine volume, dysuria and hematuria.  Musculoskeletal: Positive for arthralgias. Negative for gait problem.  Skin: Negative for rash and wound.  Allergic/Immunologic: Positive for environmental allergies.  Neurological: Negative for syncope, weakness, numbness and headaches.  Psychiatric/Behavioral: Negative for confusion. The patient is not  nervous/anxious.       Current Outpatient Prescriptions on File Prior to Visit  Medication Sig Dispense Refill  . albuterol (PROVENTIL HFA;VENTOLIN HFA) 108 (90 Base) MCG/ACT inhaler Inhale 2 puffs into the lungs every 6 (six) hours as needed for wheezing or shortness of breath.    Marland Kitchen aspirin EC 325 MG tablet Take 325 mg by mouth daily.    . Calcium-Vitamin D 600-200 MG-UNIT tablet Take 1 tablet by mouth daily.     No current facility-administered medications on file prior to visit.      Past Medical History:  Diagnosis Date  . Arthritis   . Cancer ([place])   . Diabetes mellitus without complication ([place])   . H/O mastectomy    left side  . History of breast reconstruction   . Hypertension   . Stroke [facility])    mini strokes   Allergies  Allergen Reactions  . Latex Hives  . Tetracyclines & Related Hives    Family History  Problem Relation Age of Onset  . Heart disease Mother   . Hypertension Mother   . Stroke Mother   . Heart disease Father   . Diabetes Paternal Aunt   . Diabetes Paternal Uncle   . Cancer Maternal Grandmother        colon    Social History   Social History  . Marital status: Married    Spouse name: Beth Blankenship  . Number of children: Beth Blankenship  . Years of education: Beth Blankenship   Social History Main Topics  . Smoking status: Former Research scientist (life sciences)  . Smokeless tobacco: Never Used  . Alcohol use No  . Drug use: No  . Sexual activity: Not Asked   Other Topics Concern  . None   Social History Narrative  . None    Vitals:   07/09/17 1436  BP: 136/80  Pulse: 76  Resp: 12   O2 sat at RA 92% Body mass index is 35.12 kg/m.  Physical Exam  Nursing note and vitals reviewed. Constitutional: She is oriented to person, place, and time. She appears well-developed. No distress.  HENT:  Head: Atraumatic.  Mouth/Throat: Oropharynx is clear and moist and mucous membranes are normal.  Eyes: Pupils are equal, round, and reactive to light. Conjunctivae are normal. Right eye  exhibits normal extraocular motion. Left eye exhibits abnormal extraocular motion.  Cloudy left pupil but still reactive to light.  Cardiovascular: Normal rate and regular rhythm.   Murmur (SEM I/VI RUSB) heard. Pulses:      Dorsalis pedis pulses are 2+ on the right side, and 2+ on the left side.  Respiratory: Effort normal and breath sounds normal. No respiratory distress.  GI: Soft. She exhibits no mass. There is no hepatomegaly. There is no tenderness.  Musculoskeletal: She exhibits edema (Trace pitting LE edema,bilateral). She exhibits no tenderness.  Lymphadenopathy:    She has no cervical adenopathy.  Neurological: She is alert and oriented to person, place, and time. She has normal strength. Gait normal.  Skin: Skin is warm. No rash noted. No erythema.  Psychiatric: She has a normal mood and affect.  Well groomed, good eye contact.    Diabetic foot exam:  Monofilament normal bilateral. Peripheral pulses present (  DP). No ulcers/excoriations  No calluses No hypertrophic/long toenails.   ASSESSMENT AND PLAN:   Ms. Teosha was seen today for establish care.  Diagnoses and all orders for this visit:  Controlled type 2 diabetes mellitus with hyperglycemia, with long-term current use of insulin ([place])  HgA1C pending, last one done a year ago. No changes in current management, will adjust treatment accordingly. Regular exercise and healthy diet with avoidance of added sugar food intake is an important part of treatment and recommended. Annual eye exam, periodic dental and foot care recommended. F/U in 4 months  -     Insulin Pen Needle 30G X 5 MM MISC; Injection bid -     insulin NPH-regular Human (NOVOLIN 70/30) (70-30) 100 UNIT/ML injection; Inject 18-22 Units into the skin See admin instructions. Use 22 units every morning and use 18 units before supper -     Hemoglobin A1c -     Fructosamine -     Microalbumin / creatinine urine ratio -     metFORMIN (GLUCOPHAGE) 1000  MG tablet; Take 1 tablet (1,000 mg total) by mouth 2 (two) times daily with a meal. -     glipiZIDE (GLUCOTROL) 10 MG tablet; Take 1 tablet (10 mg total) by mouth 2 (two) times daily before a meal. -     atorvastatin (LIPITOR) 20 MG tablet; Take 1 tablet (20 mg total) by mouth daily.  Essential hypertension   Adequately controlled. No changes in current management. DASH-low salt diet recommended. F/U in 4 months, before if needed.  -     metoprolol tartrate (LOPRESSOR) 100 MG tablet; Take 1 tablet (100 mg total) by mouth 2 (two) times daily. -     lisinopril (PRINIVIL,ZESTRIL) 20 MG tablet; Take 1 tablet (20 mg total) by mouth daily. -     doxazosin (CARDURA) 4 MG tablet; Take 1 tablet (4 mg total) by mouth 2 (two) times daily. -     amLODipine (NORVASC) 10 MG tablet; Take 1 tablet (10 mg total) by mouth daily. -     hydrochlorothiazide (MICROZIDE) 12.5 MG capsule; Take 1 capsule (12.5 mg total) by mouth daily.  History of breast cancer  Femara refill sent. She will continue following with oncologist.  -     letrozole (Trevose) 2.5 MG tablet; Take 1 tablet (2.5 mg total) by mouth daily. -     alendronate (FOSAMAX) 70 MG tablet; Take 1 tablet (70 mg total) by mouth once a week. On Sunday -     MM Digital Diagnostic Unilat R; Future -     Ambulatory referral to Gynecologic Oncology  Hypomagnesemia  Further recommendations will be given according to lab results.  -     Magnesium  Anemia, unspecified type  Asymptomatic. Further recommendations will be given according to lab results.  -     CBC  Asthma, intermittent, uncomplicated  Well controlled. No changes in current management. Follow-up in 12 months.  -     beclomethasone (QVAR) 80 MCG/ACT inhaler; Inhale 2 puffs into the lungs 2 (two) times daily.  Need for vaccination with 13-polyvalent pneumococcal conjugate vaccine -     Pneumococcal conjugate vaccine 13-valent      Beth Blankenship G. Martinique, MD  [facility]. [place] office.

## 2017-07-10 LAB — MICROALBUMIN / CREATININE URINE RATIO
CREATININE, U: 40.1 mg/dL
MICROALB UR: 6.1 mg/dL — AB (ref 0.0–1.9)
MICROALB/CREAT RATIO: 15.2 mg/g (ref 0.0–30.0)

## 2017-07-10 LAB — CBC
HEMATOCRIT: 33.7 % — AB (ref 36.0–46.0)
Hemoglobin: 11.4 g/dL — ABNORMAL LOW (ref 12.0–15.0)
MCHC: 33.7 g/dL (ref 30.0–36.0)
MCV: 85.4 fl (ref 78.0–100.0)
Platelets: 294 10*3/uL (ref 150.0–400.0)
RBC: 3.95 Mil/uL (ref 3.87–5.11)
RDW: 13.3 % (ref 11.5–15.5)
WBC: 5.7 10*3/uL (ref 4.0–10.5)

## 2017-07-10 LAB — MAGNESIUM: MAGNESIUM: 1.6 mg/dL (ref 1.5–2.5)

## 2017-07-10 LAB — HEMOGLOBIN A1C: Hgb A1c MFr Bld: 8.8 % — ABNORMAL HIGH (ref 4.6–6.5)

## 2017-07-11 LAB — FRUCTOSAMINE: FRUCTOSAMINE: 330 umol/L — AB (ref 190–270)

## 2017-08-01 ENCOUNTER — Telehealth: Payer: Self-pay

## 2017-08-01 MED ORDER — INSULIN GLARGINE 100 UNIT/ML SOLOSTAR PEN
30.0000 [IU] | PEN_INJECTOR | Freq: Every day | SUBCUTANEOUS | 2 refills | Status: DC
Start: 2017-08-01 — End: 2018-04-09

## 2017-08-01 NOTE — Telephone Encounter (Signed)
Rx sent to patient's preferred pharmacy.

## 2017-08-01 NOTE — Telephone Encounter (Signed)
Pt would like to have lantus insulin  Pharm:  [facility]

## 2017-09-03 ENCOUNTER — Other Ambulatory Visit: Payer: Self-pay | Admitting: Family Medicine

## 2017-09-03 DIAGNOSIS — Z1231 Encounter for screening mammogram for malignant neoplasm of breast: Secondary | ICD-10-CM

## 2018-02-06 DIAGNOSIS — I10 Essential (primary) hypertension: Secondary | ICD-10-CM | POA: Diagnosis not present

## 2018-02-06 DIAGNOSIS — E782 Mixed hyperlipidemia: Secondary | ICD-10-CM | POA: Insufficient documentation

## 2018-02-06 DIAGNOSIS — Z01419 Encounter for gynecological examination (general) (routine) without abnormal findings: Secondary | ICD-10-CM | POA: Diagnosis not present

## 2018-02-06 DIAGNOSIS — H5440 Blindness, one eye, unspecified eye: Secondary | ICD-10-CM | POA: Insufficient documentation

## 2018-02-06 DIAGNOSIS — Z794 Long term (current) use of insulin: Secondary | ICD-10-CM | POA: Diagnosis not present

## 2018-02-06 DIAGNOSIS — Z1211 Encounter for screening for malignant neoplasm of colon: Secondary | ICD-10-CM | POA: Diagnosis not present

## 2018-02-06 DIAGNOSIS — Z0000 Encounter for general adult medical examination without abnormal findings: Secondary | ICD-10-CM | POA: Diagnosis not present

## 2018-02-06 DIAGNOSIS — E1169 Type 2 diabetes mellitus with other specified complication: Secondary | ICD-10-CM | POA: Diagnosis not present

## 2018-02-06 DIAGNOSIS — Z853 Personal history of malignant neoplasm of breast: Secondary | ICD-10-CM | POA: Diagnosis not present

## 2018-02-13 ENCOUNTER — Other Ambulatory Visit: Payer: Self-pay | Admitting: Nurse Practitioner

## 2018-02-13 DIAGNOSIS — E785 Hyperlipidemia, unspecified: Secondary | ICD-10-CM | POA: Diagnosis not present

## 2018-02-13 DIAGNOSIS — R5381 Other malaise: Secondary | ICD-10-CM

## 2018-02-13 DIAGNOSIS — G459 Transient cerebral ischemic attack, unspecified: Secondary | ICD-10-CM | POA: Insufficient documentation

## 2018-03-07 DIAGNOSIS — I10 Essential (primary) hypertension: Secondary | ICD-10-CM | POA: Diagnosis not present

## 2018-03-07 DIAGNOSIS — Z794 Long term (current) use of insulin: Secondary | ICD-10-CM | POA: Diagnosis not present

## 2018-03-07 DIAGNOSIS — E118 Type 2 diabetes mellitus with unspecified complications: Secondary | ICD-10-CM | POA: Diagnosis not present

## 2018-03-14 ENCOUNTER — Encounter: Payer: Self-pay | Admitting: Hematology and Oncology

## 2018-03-14 ENCOUNTER — Telehealth: Payer: Self-pay | Admitting: Hematology and Oncology

## 2018-03-14 NOTE — Telephone Encounter (Signed)
Appt has been scheduled for the pt to see Dr. Lindi Adie on 4/16 at 1pm. Pt aware to arrive 30 minutes early. Address and location given to the pt. Letter mailed.

## 2018-03-21 DIAGNOSIS — H2512 Age-related nuclear cataract, left eye: Secondary | ICD-10-CM | POA: Diagnosis not present

## 2018-03-21 DIAGNOSIS — E113491 Type 2 diabetes mellitus with severe nonproliferative diabetic retinopathy without macular edema, right eye: Secondary | ICD-10-CM | POA: Diagnosis not present

## 2018-03-21 DIAGNOSIS — Z961 Presence of intraocular lens: Secondary | ICD-10-CM | POA: Diagnosis not present

## 2018-03-21 DIAGNOSIS — H26491 Other secondary cataract, right eye: Secondary | ICD-10-CM | POA: Diagnosis not present

## 2018-03-21 DIAGNOSIS — H25812 Combined forms of age-related cataract, left eye: Secondary | ICD-10-CM | POA: Diagnosis not present

## 2018-03-26 DIAGNOSIS — E113491 Type 2 diabetes mellitus with severe nonproliferative diabetic retinopathy without macular edema, right eye: Secondary | ICD-10-CM | POA: Diagnosis not present

## 2018-03-26 DIAGNOSIS — Z961 Presence of intraocular lens: Secondary | ICD-10-CM | POA: Diagnosis not present

## 2018-03-26 DIAGNOSIS — H2512 Age-related nuclear cataract, left eye: Secondary | ICD-10-CM | POA: Diagnosis not present

## 2018-03-26 DIAGNOSIS — H2589 Other age-related cataract: Secondary | ICD-10-CM | POA: Diagnosis not present

## 2018-04-08 DIAGNOSIS — H2512 Age-related nuclear cataract, left eye: Secondary | ICD-10-CM | POA: Diagnosis not present

## 2018-04-08 DIAGNOSIS — H2589 Other age-related cataract: Secondary | ICD-10-CM | POA: Diagnosis not present

## 2018-04-09 ENCOUNTER — Inpatient Hospital Stay: Payer: Medicare Other | Attending: Hematology and Oncology | Admitting: Hematology and Oncology

## 2018-04-09 DIAGNOSIS — M8580 Other specified disorders of bone density and structure, unspecified site: Secondary | ICD-10-CM | POA: Insufficient documentation

## 2018-04-09 DIAGNOSIS — Z87891 Personal history of nicotine dependence: Secondary | ICD-10-CM | POA: Diagnosis not present

## 2018-04-09 DIAGNOSIS — Z9012 Acquired absence of left breast and nipple: Secondary | ICD-10-CM | POA: Diagnosis not present

## 2018-04-09 DIAGNOSIS — I10 Essential (primary) hypertension: Secondary | ICD-10-CM | POA: Diagnosis not present

## 2018-04-09 DIAGNOSIS — C50412 Malignant neoplasm of upper-outer quadrant of left female breast: Secondary | ICD-10-CM | POA: Diagnosis not present

## 2018-04-09 DIAGNOSIS — E119 Type 2 diabetes mellitus without complications: Secondary | ICD-10-CM | POA: Insufficient documentation

## 2018-04-09 DIAGNOSIS — Z79811 Long term (current) use of aromatase inhibitors: Secondary | ICD-10-CM | POA: Diagnosis not present

## 2018-04-09 DIAGNOSIS — Z8673 Personal history of transient ischemic attack (TIA), and cerebral infarction without residual deficits: Secondary | ICD-10-CM | POA: Diagnosis not present

## 2018-04-09 DIAGNOSIS — Z171 Estrogen receptor negative status [ER-]: Secondary | ICD-10-CM | POA: Insufficient documentation

## 2018-04-09 DIAGNOSIS — Z853 Personal history of malignant neoplasm of breast: Secondary | ICD-10-CM

## 2018-04-09 DIAGNOSIS — D0511 Intraductal carcinoma in situ of right breast: Secondary | ICD-10-CM | POA: Insufficient documentation

## 2018-04-09 MED ORDER — INSULIN REGULAR HUMAN 100 UNIT/ML IJ SOLN: INTRAMUSCULAR | 11 refills | Status: DC

## 2018-04-09 MED ORDER — LETROZOLE 2.5 MG PO TABS: 2.5000 mg | ORAL_TABLET | Freq: Every day | ORAL | 3 refills | Status: DC

## 2018-04-09 NOTE — Progress Notes (Signed)
[place] NOTE  Patient Care Team: Martinique, Betty G, MD as PCP - General (Family Medicine)  CHIEF COMPLAINTS/PURPOSE OF CONSULTATION:  Transfer of care for history of breast cancer  HISTORY OF PRESENTING ILLNESS:  Beth Blankenship 67 y.o. female is here because of history of left breast cancer.  Patient had routine screening mammogram that detected extensive pleomorphic calcifications involving the majority of the left breast.  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  She underwent a mastectomy and sentinel lymph node biopsy at [facility].  Mastectomy revealed extensive area of DCIS but there was a small focus of invasive breast cancer measuring 0.4 cm.  The lymph nodes were negative.  She underwent immediate breast reconstruction on the reconstructive process finally concluded 05/21/2015.  She is currently on letrozole therapy since July 2016.  She appears to be tolerating letrozole extremely well.  Denies any hot flashes or myalgias.  I reviewed her records extensively and collaborated the history with the patient.  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 (treatment given at [facility] healthcare: [facility])      03/31/2015 Breast MRI    Large area of non-mass enhancement 7.5 x 4.6 x 4.6 cm, also multiple nonspecific normal appearing lymph nodes in the axilla bilaterally      04/29/2015 Surgery    Left skin sparing mastectomy with immediate reconstruction: Pathology revealed IDC 0.4 cm margins -0/2 lymph nodes negative, ER 0%, PR weakly +11-50%, HER-2/neu 1+, FISH negative, extensive DCIS, T1 a N0 stage I a      05/21/2015 Surgery    Completed left breast reconstruction  and right reduction mammoplasty in September 2016      07/01/2015 -  Anti-estrogen oral therapy    Antiestrogen therapy with letrozole 2.5 mg daily       MEDICAL HISTORY:  Past Medical History:  Diagnosis Date  . Arthritis   . Cancer ([place])   . Diabetes mellitus without complication ([place])   . H/O mastectomy    left side  . History of breast reconstruction   . Hypertension   . Stroke [facility])    mini strokes    SURGICAL HISTORY: Past Surgical History:  Procedure Laterality Date  . SHOULDER SURGERY Right   . TUBAL LIGATION      SOCIAL HISTORY: Social History   Socioeconomic History  . Marital status: Married    Spouse name: Not on file  . Number of children: Not on file  . Years of education: Not on file  . Highest education level: Not on file  Occupational History  . Not on file  Social Needs  . Financial resource strain: Not on file  . Food insecurity:    Worry: Not on file    Inability: Not on file  . Transportation needs:    Medical: Not on file    Non-medical: Not on file  Tobacco Use  . Smoking status: Former Research scientist (life sciences)  . Smokeless tobacco: Never Used  Substance and Sexual Activity  . Alcohol use: No  . Drug use: No  . Sexual activity: Not on file  Lifestyle  . Physical activity:    Days per week: Not on file    Minutes per session: Not on  file  . Stress: Not on file  Relationships  . Social connections:    Talks on phone: Not on file    Gets together: Not on file    Attends religious service: Not on file    Active member of club or organization: Not on file    Attends meetings of clubs or organizations: Not on file    Relationship status: Not on file  . Intimate partner violence:    Fear of current or ex partner: Not on file    Emotionally abused: Not on file    Physically abused: Not on file    Forced sexual activity: Not on file  Other Topics Concern  . Not on file  Social History Narrative  . Not on file    FAMILY HISTORY: Family History   Problem Relation Age of Onset  . Heart disease Mother   . Hypertension Mother   . Stroke Mother   . Heart disease Father   . Diabetes Paternal Aunt   . Diabetes Paternal Uncle   . Cancer Maternal Grandmother        colon    ALLERGIES:  is allergic to latex and tetracyclines & related.  MEDICATIONS:  Current Outpatient Medications  Medication Sig Dispense Refill  . albuterol (PROVENTIL HFA;VENTOLIN HFA) 108 (90 Base) MCG/ACT inhaler Inhale 2 puffs into the lungs every 6 (six) hours as needed for wheezing or shortness of breath.    Marland Kitchen alendronate (FOSAMAX) 70 MG tablet Take 1 tablet (70 mg total) by mouth once a week. On Sunday 13 tablet 3  . amLODipine (NORVASC) 10 MG tablet Take 1 tablet (10 mg total) by mouth daily. 90 tablet 2  . aspirin EC 325 MG tablet Take 325 mg by mouth daily.    Marland Kitchen atorvastatin (LIPITOR) 20 MG tablet Take 1 tablet (20 mg total) by mouth daily. 90 tablet 2  . Calcium-Vitamin D 600-200 MG-UNIT tablet Take 1 tablet by mouth daily.    Marland Kitchen doxazosin (CARDURA) 4 MG tablet Take 1 tablet (4 mg total) by mouth 2 (two) times daily. 180 tablet 1  . glipiZIDE (GLUCOTROL) 10 MG tablet Take 1 tablet (10 mg total) by mouth 2 (two) times daily before a meal. 60 tablet 2  . hydrochlorothiazide (MICROZIDE) 12.5 MG capsule Take 1 capsule (12.5 mg total) by mouth daily. 90 capsule 1  . insulin regular (HUMULIN R) 100 units/mL injection 34 units in AM and 20 units in PM 10 mL 11  . letrozole (FEMARA) 2.5 MG tablet Take 1 tablet (2.5 mg total) by mouth daily. 90 tablet 3  . lisinopril (PRINIVIL,ZESTRIL) 20 MG tablet Take 1 tablet (20 mg total) by mouth daily. 90 tablet 1  . metFORMIN (GLUCOPHAGE) 1000 MG tablet Take 1 tablet (1,000 mg total) by mouth 2 (two) times daily with a meal. 180 tablet 2  . metoprolol tartrate (LOPRESSOR) 100 MG tablet Take 1 tablet (100 mg total) by mouth 2 (two) times daily. 180 tablet 2   No current facility-administered medications for this visit.      REVIEW OF SYSTEMS:   Constitutional: Denies fevers, chills or abnormal night sweats Eyes: Denies blurriness of vision, double vision or watery eyes Ears, nose, mouth, throat, and face: Denies mucositis or sore throat Respiratory: Denies cough, dyspnea or wheezes Cardiovascular: Denies palpitation, chest discomfort or lower extremity swelling Gastrointestinal:  Denies nausea, heartburn or change in bowel habits Skin: Denies abnormal skin rashes Lymphatics: Denies new lymphadenopathy or easy bruising Neurological:Denies numbness, tingling or  new weaknesses Behavioral/Psych: Mood is stable, no new changes  Breast:  Denies any palpable lumps or discharge All other systems were reviewed with the patient and are negative.  PHYSICAL EXAMINATION: ECOG PERFORMANCE STATUS: 1 - Symptomatic but completely ambulatory  Vitals:   04/09/18 1258  BP: (!) 146/73  Pulse: 68  Resp: 18  Temp: 98.3 F (36.8 C)  SpO2: 96%   Filed Weights   04/09/18 1258  Weight: 202 lb 12.8 oz (92 kg)    GENERAL:alert, no distress and comfortable SKIN: skin color, texture, turgor are normal, no rashes or significant lesions EYES: normal, conjunctiva are pink and non-injected, sclera clear OROPHARYNX:no exudate, no erythema and lips, buccal mucosa, and tongue normal  NECK: supple, thyroid normal size, non-tender, without nodularity LYMPH:  no palpable lymphadenopathy in the cervical, axillary or inguinal LUNGS: clear to auscultation and percussion with normal breathing effort HEART: regular rate & rhythm and no murmurs and no lower extremity edema ABDOMEN:abdomen soft, non-tender and normal bowel sounds Musculoskeletal:no cyanosis of digits and no clubbing  PSYCH: alert & oriented x 3 with fluent speech NEURO: no focal motor/sensory deficits BREAST: No palpable nodules in breast.  Left breast has been reconstructed.  Right breast reduction. no palpable axillary or supraclavicular lymphadenopathy (exam  performed in the presence of a chaperone)   LABORATORY DATA:  I have reviewed the data as listed Lab Results  Component Value Date   WBC 5.7 07/09/2017   HGB 11.4 (L) 07/09/2017   HCT 33.7 (L) 07/09/2017   MCV 85.4 07/09/2017   PLT 294.0 07/09/2017   Lab Results  Component Value Date   NA 138 06/26/2017   K 4.2 06/26/2017   CL 107 06/26/2017   CO2 20 (L) 06/26/2017    RADIOGRAPHIC STUDIES: I have personally reviewed the radiological reports and agreed with the findings in the report.  ASSESSMENT AND PLAN:  Malignant neoplasm of upper-outer quadrant of left breast in female, estrogen receptor negative ([place]) 04/29/2015 left skin sparing mastectomy with immediate reconstruction: Pathology revealed IDC 0.4 cm margins -0/2 lymph nodes negative, ER 0%, PR weakly +11-50%, HER-2/neu 1+, FISH negative, extensive DCIS, T1 a N0 stage I a Treatment was given at [facility] We assumed her care 04/09/2018  Current treatment: Letrozole 2.5 mg daily started 06/29/2015 Letrozole Toxicities: Diffuse muscle aches and pains Patient takes Fosamax for osteopenia.  She will talk to her primary care physician about getting a bone density test every 2 years.  Surveillance: 1.  04/09/2018: No palpable lumps or nodules of concern 2. annual mammogram on the right breast will be set up for the next 2 weeks.  She has not had a mammogram last year.  Return to clinic once a year for follow-up    All questions were answered. The patient knows to call the clinic with any problems, questions or concerns.    Harriette Ohara, MD 04/09/18

## 2018-04-09 NOTE — Assessment & Plan Note (Signed)
04/29/2015 left skin sparing mastectomy with immediate reconstruction: Pathology revealed IDC 0.4 cm margins -0/2 lymph nodes negative, ER 0%, PR weakly +11-50%, HER-2/neu 1+, FISH negative, extensive DCIS, T1 a N0 stage I a Treatment was given at [facility] We assumed her care 04/09/2018  Current treatment: Letrozole 2.5 mg daily started 06/29/2015 Letrozole Toxicities: None  Surveillance: 1.  04/09/2018: No palpable lumps or nodules of concern 2. annual mammogram on the right breast  Return to clinic once a year for follow-up

## 2018-04-10 ENCOUNTER — Telehealth: Payer: Self-pay | Admitting: Hematology and Oncology

## 2018-04-10 NOTE — Telephone Encounter (Signed)
Mailed patient calendar of upcoming April 2020 appointments.

## 2018-04-12 ENCOUNTER — Other Ambulatory Visit: Payer: Self-pay | Admitting: Hematology and Oncology

## 2018-04-12 DIAGNOSIS — Z1231 Encounter for screening mammogram for malignant neoplasm of breast: Secondary | ICD-10-CM

## 2018-05-02 ENCOUNTER — Ambulatory Visit
Admission: RE | Admit: 2018-05-02 | Discharge: 2018-05-02 | Disposition: A | Discharge status: Home / Self Care | Payer: Medicare Other | Source: Ambulatory Visit | Attending: Hematology and Oncology | Admitting: Hematology and Oncology

## 2018-05-02 DIAGNOSIS — Z1231 Encounter for screening mammogram for malignant neoplasm of breast: Secondary | ICD-10-CM

## 2018-05-06 DIAGNOSIS — H4062X2 Glaucoma secondary to drugs, left eye, moderate stage: Secondary | ICD-10-CM | POA: Diagnosis not present

## 2018-05-06 DIAGNOSIS — H59022 Cataract (lens) fragments in eye following cataract surgery, left eye: Secondary | ICD-10-CM | POA: Diagnosis not present

## 2018-05-06 DIAGNOSIS — E113491 Type 2 diabetes mellitus with severe nonproliferative diabetic retinopathy without macular edema, right eye: Secondary | ICD-10-CM | POA: Diagnosis not present

## 2018-05-06 DIAGNOSIS — H3561 Retinal hemorrhage, right eye: Secondary | ICD-10-CM | POA: Diagnosis not present

## 2018-05-09 DIAGNOSIS — E113492 Type 2 diabetes mellitus with severe nonproliferative diabetic retinopathy without macular edema, left eye: Secondary | ICD-10-CM | POA: Diagnosis not present

## 2018-05-09 DIAGNOSIS — H59022 Cataract (lens) fragments in eye following cataract surgery, left eye: Secondary | ICD-10-CM | POA: Diagnosis not present

## 2018-05-09 DIAGNOSIS — E113592 Type 2 diabetes mellitus with proliferative diabetic retinopathy without macular edema, left eye: Secondary | ICD-10-CM | POA: Diagnosis not present

## 2018-06-25 DIAGNOSIS — I10 Essential (primary) hypertension: Secondary | ICD-10-CM | POA: Diagnosis not present

## 2018-06-25 DIAGNOSIS — Z1159 Encounter for screening for other viral diseases: Secondary | ICD-10-CM | POA: Diagnosis not present

## 2018-06-25 DIAGNOSIS — Z23 Encounter for immunization: Secondary | ICD-10-CM | POA: Diagnosis not present

## 2018-06-25 DIAGNOSIS — E118 Type 2 diabetes mellitus with unspecified complications: Secondary | ICD-10-CM | POA: Diagnosis not present

## 2018-06-25 DIAGNOSIS — Z794 Long term (current) use of insulin: Secondary | ICD-10-CM | POA: Diagnosis not present

## 2018-06-25 DIAGNOSIS — E782 Mixed hyperlipidemia: Secondary | ICD-10-CM | POA: Diagnosis not present

## 2018-06-26 ENCOUNTER — Other Ambulatory Visit: Payer: Self-pay | Admitting: Nurse Practitioner

## 2018-06-26 DIAGNOSIS — E2839 Other primary ovarian failure: Secondary | ICD-10-CM

## 2018-06-28 ENCOUNTER — Other Ambulatory Visit: Payer: Self-pay | Admitting: Nurse Practitioner

## 2018-06-28 DIAGNOSIS — Z79811 Long term (current) use of aromatase inhibitors: Secondary | ICD-10-CM

## 2018-06-28 DIAGNOSIS — M810 Age-related osteoporosis without current pathological fracture: Secondary | ICD-10-CM

## 2018-07-26 DIAGNOSIS — E113491 Type 2 diabetes mellitus with severe nonproliferative diabetic retinopathy without macular edema, right eye: Secondary | ICD-10-CM | POA: Diagnosis not present

## 2018-07-26 DIAGNOSIS — E113492 Type 2 diabetes mellitus with severe nonproliferative diabetic retinopathy without macular edema, left eye: Secondary | ICD-10-CM | POA: Diagnosis not present

## 2018-08-12 ENCOUNTER — Ambulatory Visit
Admission: RE | Admit: 2018-08-12 | Discharge: 2018-08-12 | Disposition: A | Discharge status: Home / Self Care | Payer: Medicare Other | Source: Ambulatory Visit | Attending: Nurse Practitioner | Admitting: Nurse Practitioner

## 2018-08-12 DIAGNOSIS — Z79811 Long term (current) use of aromatase inhibitors: Secondary | ICD-10-CM

## 2018-08-12 DIAGNOSIS — Z780 Asymptomatic menopausal state: Secondary | ICD-10-CM | POA: Diagnosis not present

## 2018-08-12 DIAGNOSIS — M8589 Other specified disorders of bone density and structure, multiple sites: Secondary | ICD-10-CM | POA: Diagnosis not present

## 2018-08-12 DIAGNOSIS — M810 Age-related osteoporosis without current pathological fracture: Secondary | ICD-10-CM

## 2019-01-20 DIAGNOSIS — M79601 Pain in right arm: Secondary | ICD-10-CM | POA: Diagnosis not present

## 2019-01-20 DIAGNOSIS — R9431 Abnormal electrocardiogram [ECG] [EKG]: Secondary | ICD-10-CM | POA: Diagnosis not present

## 2019-01-20 DIAGNOSIS — G5601 Carpal tunnel syndrome, right upper limb: Secondary | ICD-10-CM | POA: Diagnosis not present

## 2019-01-23 DIAGNOSIS — E782 Mixed hyperlipidemia: Secondary | ICD-10-CM | POA: Diagnosis not present

## 2019-01-23 DIAGNOSIS — Z794 Long term (current) use of insulin: Secondary | ICD-10-CM | POA: Diagnosis not present

## 2019-01-23 DIAGNOSIS — M778 Other enthesopathies, not elsewhere classified: Secondary | ICD-10-CM | POA: Diagnosis not present

## 2019-01-23 DIAGNOSIS — E118 Type 2 diabetes mellitus with unspecified complications: Secondary | ICD-10-CM | POA: Diagnosis not present

## 2019-01-23 DIAGNOSIS — I10 Essential (primary) hypertension: Secondary | ICD-10-CM | POA: Diagnosis not present

## 2019-04-03 NOTE — Assessment & Plan Note (Signed)
04/29/2015 left skin sparing mastectomy with immediate reconstruction: Pathology revealed IDC 0.4 cm margins -0/2 lymph nodes negative, ER 0%, PR weakly +11-50%, HER-2/neu 1+, FISH negative, extensive DCIS, T1 a N0 stage I a Treatment was given at [facility] We assumed her care 04/09/2018  Current treatment: Letrozole 2.5 mg daily started 06/29/2015 Letrozole Toxicities: Diffuse muscle aches and pains Patient takes Fosamax for osteopenia.  She will talk to her primary care physician about getting a bone density test every 2 years.  Surveillance: 1.  04/09/2018: No palpable lumps or nodules of concern 2. annual mammogram on the right breast  05/02/2018: Benign breast density category C 3.  08/12/2018: Bone density T score -1.5: Osteopenia  Return to clinic once a year for follow-up 

## 2019-04-14 ENCOUNTER — Telehealth: Payer: Self-pay | Admitting: Hematology and Oncology

## 2019-04-14 NOTE — Progress Notes (Signed)
HEMATOLOGY-ONCOLOGY TELEPHONE VISIT PROGRESS NOTE  I connected with Beth Blankenship on 04/15/2019 at  1:45 PM EDT by TELEPHONE video conference and verified that I am speaking with the correct person using two identifiers.  I discussed the limitations, risks, security and privacy concerns of performing an evaluation and management service by Telephone and the availability of in person appointments.  I also discussed with the patient that there may be a patient responsible charge related to this service. The patient expressed understanding and agreed to proceed.  Patient's Location: Home Physician Location: Clinic  CHIEF COMPLIANT: Follow-up of letrozole  INTERVAL HISTORY: Beth Blankenship is a 68 y.o. female with above-mentioned history of left breast cancer who is currently on letrozole. I last saw her a year ago. Her most recent mammogram on 05/02/18 showed no evidence of malignancy. Her most recent bone scan on 08/12/18 showed a T-score of -1.5. She presents today over telephone for annual follow-up.     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 (treatment given at [facility] healthcare: [facility])    03/31/2015 Breast MRI    Large area of non-mass enhancement 7.5 x 4.6 x 4.6 cm, also multiple nonspecific normal appearing lymph nodes in the axilla bilaterally    04/29/2015 Surgery    Left skin sparing mastectomy with immediate reconstruction: Pathology revealed IDC 0.4 cm margins -0/2 lymph nodes negative, ER 0%, PR weakly +11-50%, HER-2/neu 1+, FISH negative, extensive DCIS, T1 a N0 stage I a    05/21/2015 Surgery    Completed left breast reconstruction and right reduction mammoplasty in September 2016    07/01/2015 -  Anti-estrogen oral therapy   Antiestrogen therapy with letrozole 2.5 mg daily     REVIEW OF SYSTEMS:   Constitutional: Denies fevers, chills or abnormal weight loss Eyes: Denies blurriness of vision Ears, nose, mouth, throat, and face: Denies mucositis or sore throat Respiratory: Asthma issues Cardiovascular: Denies palpitation, chest discomfort Gastrointestinal:  Denies nausea, heartburn or change in bowel habits Skin: Denies abnormal skin rashes Lymphatics: Denies new lymphadenopathy or easy bruising Neurological:Denies numbness, tingling or new weaknesses Behavioral/Psych: Mood is stable, no new changes  Extremities: No lower extremity edema Breast: Left mastectomy with reconstruction and right reduction mammoplasty All other systems were reviewed with the patient and are negative.  Observations/Objective:  There were no vitals filed for this visit. There is no height or weight on file to calculate BMI.  I have reviewed the data as listed CMP Latest Ref Rng & Units 06/26/2017 06/25/2017 06/24/2017  Glucose 65 - 99 mg/dL 228(H) 99 265(H)  BUN 6 - 20 mg/dL '9 9 7  ' Creatinine 0.44 - 1.00 mg/dL 0.70 0.81 0.60  Sodium 135 - 145 mmol/L 138 137 138  Potassium 3.5 - 5.1 mmol/L 4.2 2.9(L) 2.7(LL)  Chloride 101 - 111 mmol/L 107 106 96(L)  CO2 22 - 32 mmol/L 20(L) 22 -  Calcium 8.9 - 10.3 mg/dL 8.7(L) 8.2(L) -  Total Protein 6.5 - 8.1 g/dL - - -  Total Bilirubin 0.3 - 1.2 mg/dL - - -  Alkaline Phos 38 - 126 U/L - - -  AST 15 - 41 U/L - - -  ALT 14 - 54 U/L - - -    Lab Results  Component Value Date   WBC 5.7  07/09/2017   HGB 11.4 (L) 07/09/2017   HCT 33.7 (L) 07/09/2017   MCV 85.4 07/09/2017   PLT 294.0 07/09/2017   NEUTROABS 13.7 (H) 06/24/2017      Assessment Plan:  Malignant neoplasm of upper-outer quadrant of left breast in female, estrogen receptor negative ([place]) 04/29/2015 left skin sparing mastectomy with immediate reconstruction: Pathology revealed IDC 0.4 cm margins -0/2 lymph nodes negative, ER 0%,  PR weakly +11-50%, HER-2/neu 1+, FISH negative, extensive DCIS, T1 a N0 stage I a Treatment was given at [facility] We assumed her care 04/09/2018  Current treatment: Letrozole 2.5 mg daily started 06/29/2015 Letrozole Toxicities: Diffuse muscle aches and pains Carpal Tunnel Pinched nerve Leg  Patient takes Fosamax for osteopenia.  She will talk to her primary care physician about getting a bone density test every 2 years.  Surveillance: 1.  04/09/2018: No palpable lumps or nodules of concern 2. annual mammogram on the right breast  05/02/2018: Benign breast density category C 3.  08/12/2018: Bone density T score -1.5: Osteopenia  Patient has asthma and diabetes and is trying to stay away from anyone because of coronavirus concerns. Return to clinic once a year for follow-up  I discussed the assessment and treatment plan with the patient. The patient was provided an opportunity to ask questions and all were answered. The patient agreed with the plan and demonstrated an understanding of the instructions. The patient was advised to call back or seek an in-person evaluation if the symptoms worsen or if the condition fails to improve as anticipated.   I provided 15 minutes of non-face-to-face Web Ex time during this encounter.    Rulon Eisenmenger, MD 04/15/2019    I, Molly Dorshimer, am acting as scribe for Nicholas Lose, MD.  I have reviewed the above documentation for accuracy and completeness, and I agree with the above.

## 2019-04-14 NOTE — Telephone Encounter (Signed)
Left voicemail for patient regarding her Webex appointment. I told her told download the [employer] App onto one of her devices. And sent her join link to [email or web address]. Told her to click the green "join meeting" button and that would populate the appointment details. Would also prompt her to download the app if she hadn't already.   Told her if she was unable to do a video call with Dr. Lindi Adie, to call us back and we would convert it to a phone call visit.

## 2019-04-15 ENCOUNTER — Inpatient Hospital Stay: Payer: Medicare Other | Attending: Hematology and Oncology | Admitting: Hematology and Oncology

## 2019-04-15 DIAGNOSIS — C50412 Malignant neoplasm of upper-outer quadrant of left female breast: Secondary | ICD-10-CM | POA: Diagnosis not present

## 2019-04-15 DIAGNOSIS — Z171 Estrogen receptor negative status [ER-]: Secondary | ICD-10-CM | POA: Diagnosis not present

## 2019-04-15 DIAGNOSIS — Z853 Personal history of malignant neoplasm of breast: Secondary | ICD-10-CM

## 2019-04-15 DIAGNOSIS — M8580 Other specified disorders of bone density and structure, unspecified site: Secondary | ICD-10-CM

## 2019-04-15 DIAGNOSIS — Z79818 Long term (current) use of other agents affecting estrogen receptors and estrogen levels: Secondary | ICD-10-CM | POA: Diagnosis not present

## 2019-04-15 MED ORDER — LETROZOLE 2.5 MG PO TABS: 2.5000 mg | ORAL_TABLET | Freq: Every day | ORAL | 3 refills | Status: DC

## 2019-04-15 MED ORDER — ALENDRONATE SODIUM 70 MG PO TABS: 70.0000 mg | ORAL_TABLET | ORAL | 3 refills | Status: DC

## 2019-07-01 IMAGING — CT CT NECK W/ CM
4 series · 14 of 33 positions shown, 17 images · IV contrast (APPLIED)
Comparison: None.

CLINICAL DATA: 66 y/o  F; right neck swelling.

EXAM:
CT NECK WITH CONTRAST
TECHNIQUE: Multidetector CT imaging of the neck was performed using the
standard protocol following the bolus administration of intravenous
contrast.
CONTRAST:  75mL [id number] IOPAMIDOL ([id number]) INJECTION 61%

[Series 3: neck 2.0 i31s 3 · axial · 0.53mm/px · z∈[-218,-72]mm · 5 of 111 slices shown, 7 images]
[im 19/111  soft-tissue]
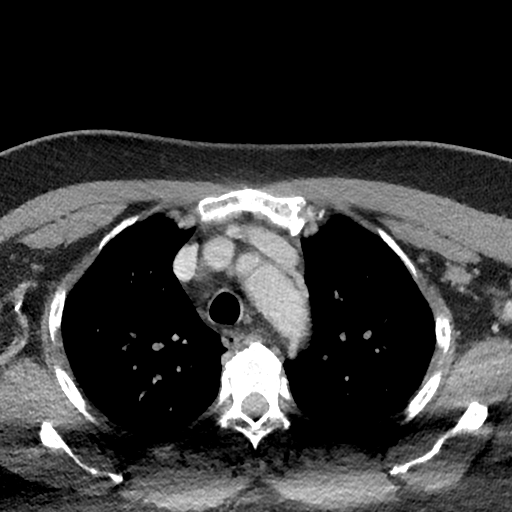
[im 19/111  bone]
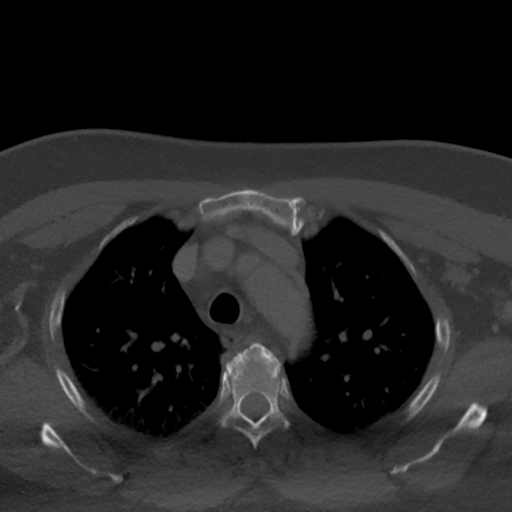
[im 37/111  bone]
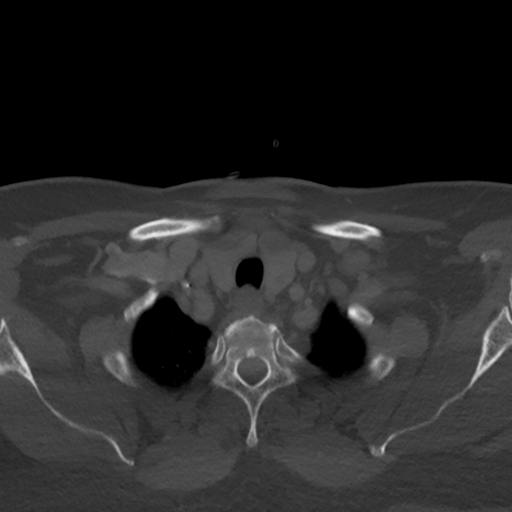
[im 56/111  bone]
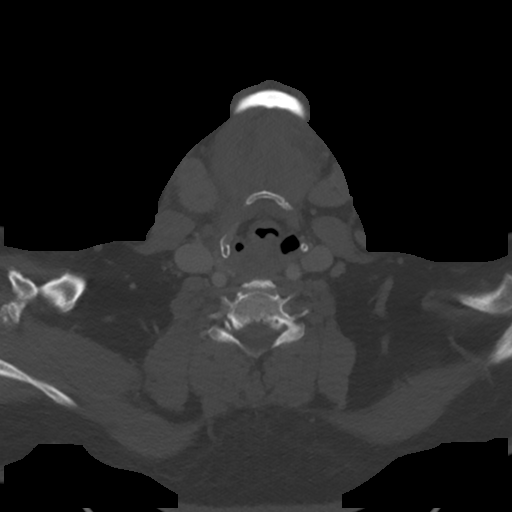
[im 74/111  bone]
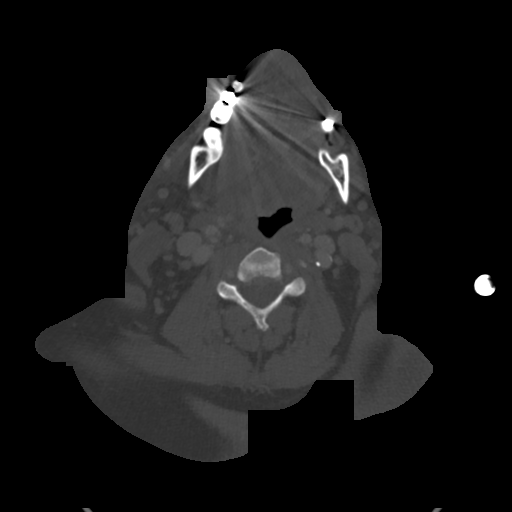
[im 92/111  soft-tissue]
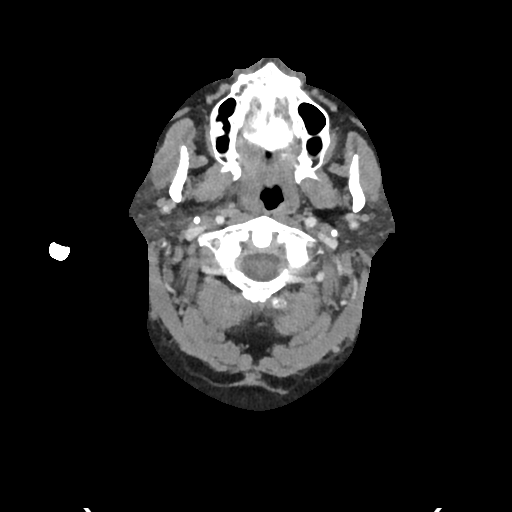
[im 92/111  bone]
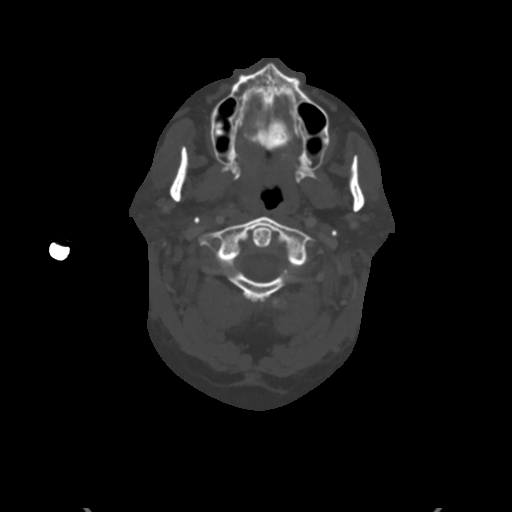

[Series 6: coronal st · coronal · 0.43mm/px · 3 of 108 slices shown]
[im 22/108  bone]
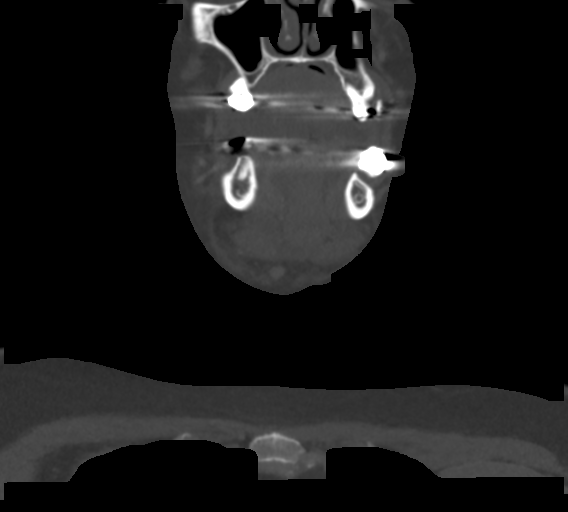
[im 43/108  bone]
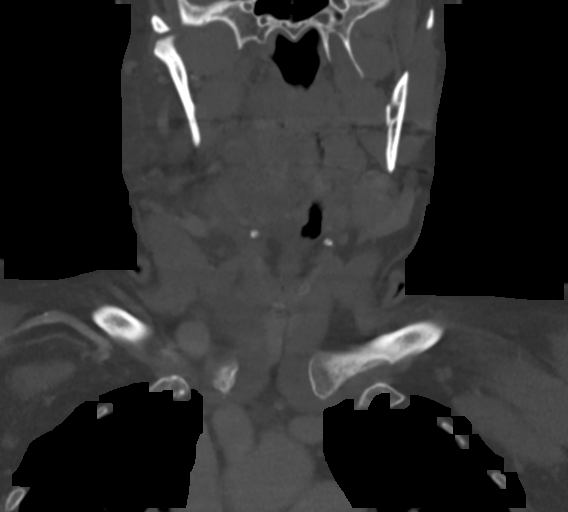
[im 65/108  bone]
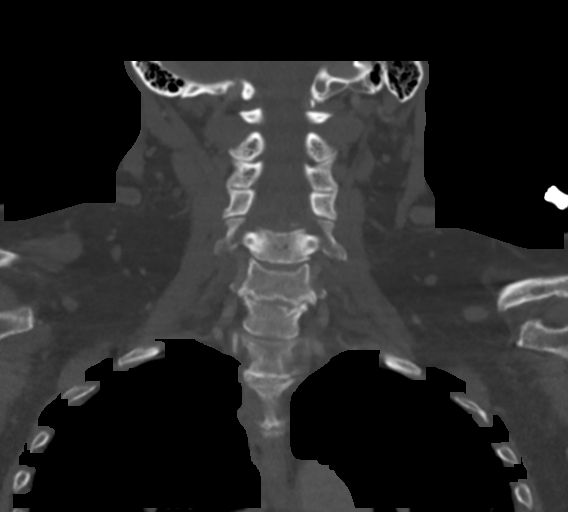

[Series 7: sagittal st · sagittal · 0.43mm/px · 5 of 126 slices shown, 6 images]
[im 42/126  bone]
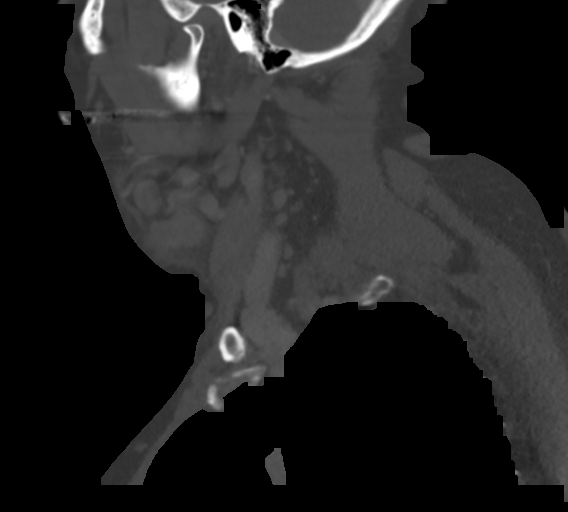
[im 53/126  bone]
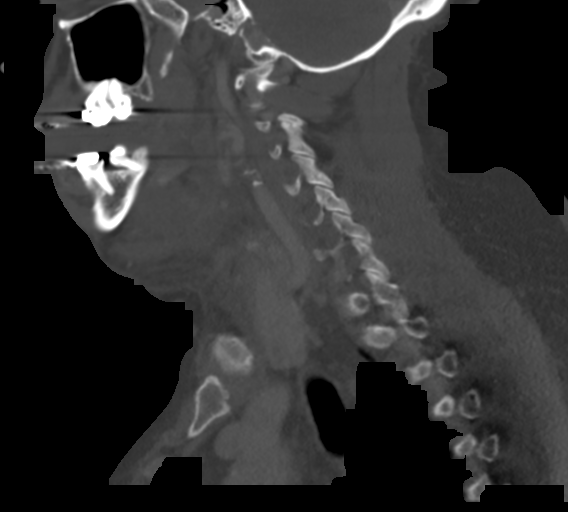
[im 63/126  soft-tissue]
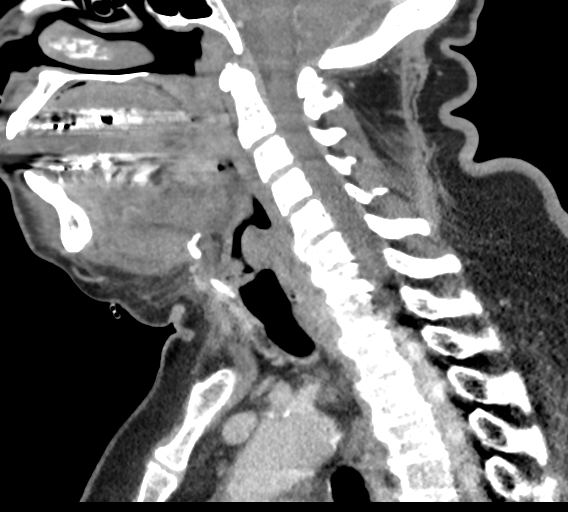
[im 63/126  bone]
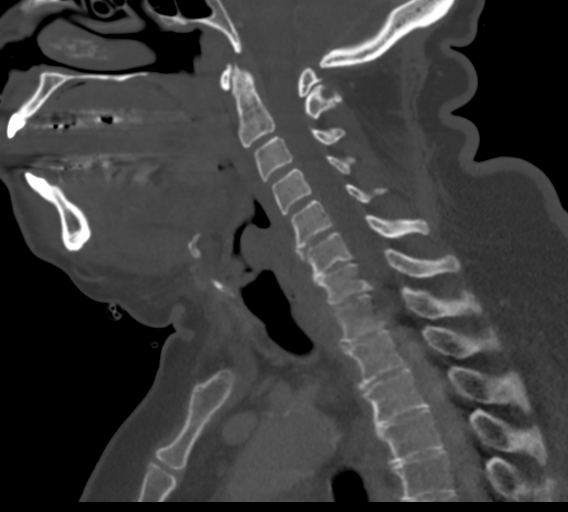
[im 73/126  bone]
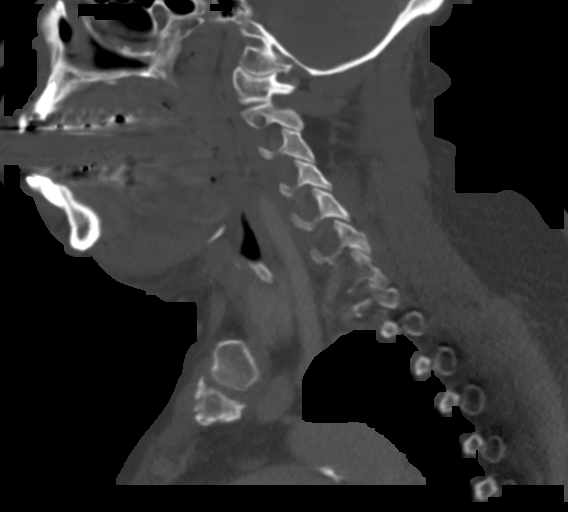
[im 84/126  bone]
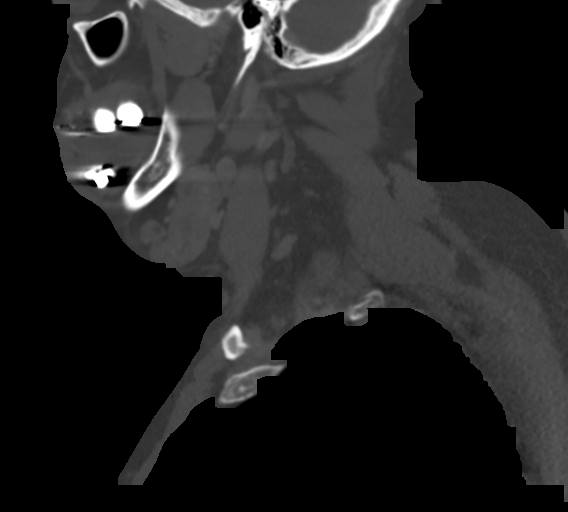

[Series 8: orthogonal st · axial · 0.39mm/px · 1 of 117 slices shown]
[im 20/117  bone]
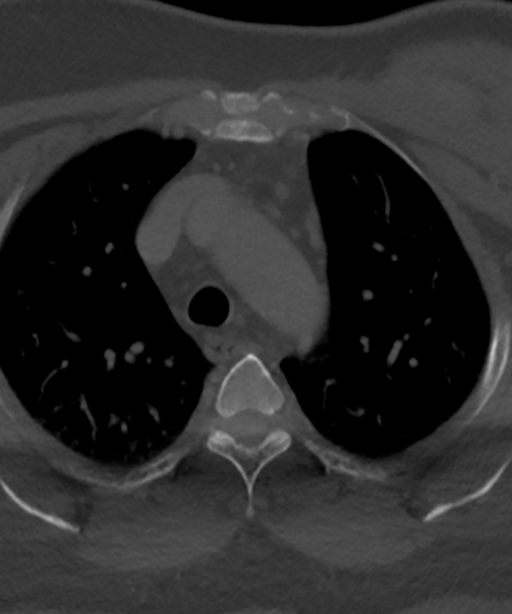

[14 of 33 positions shown; findings below may reference images not displayed]

FINDINGS: Pharynx and larynx: Mucosal thickening of the right lateral oral and
hypopharynx.

Salivary glands: Dilatation of the right submandibular duct with
probable 4 mm stone obstructing the distal duct (series 6, image
13). Other salivary glands are normal.

Thyroid: Normal.

Lymph nodes: Mild prominence of upper cervical lymph nodes and
submandibular lymph nodes are likely reactive.

Vascular: Moderate calcific atherosclerosis of carotid bifurcations
without significant stenosis.

Limited intracranial: Negative.

Visualized orbits: Negative.

Mastoids and visualized paranasal sinuses: Clear.

Skeleton: No acute or aggressive process.

Upper chest: Negative.

Other: Inflammatory changes within the right greater than left
submandibular and sublingual spaces, right parapharyngeal space, and
subcutaneous fat of the upper neck. No discrete abscess is
identified.
IMPRESSION: 1. Dilatation of right submandibular duct with probable 4 mm
obstructing sialolith within the anterior floor of mouth. The region
of interest is directly obscured by streak artifact from dental
hardware.
2. Inflammation within the right greater than left upper neck
including submandibular spaces, sublingual spaces, and right
parapharyngeal space. No discrete rim enhancing abscess is
identified.
These results were called by telephone at the time of interpretation
on 06/24/2017 at [DATE] to Dr. CONROY RV , who verbally
acknowledged these results.

By: Dregg Robert Valentin M.D.

## 2020-01-22 DIAGNOSIS — E782 Mixed hyperlipidemia: Secondary | ICD-10-CM | POA: Diagnosis not present

## 2020-01-22 DIAGNOSIS — Z794 Long term (current) use of insulin: Secondary | ICD-10-CM | POA: Diagnosis not present

## 2020-01-22 DIAGNOSIS — I10 Essential (primary) hypertension: Secondary | ICD-10-CM | POA: Diagnosis not present

## 2020-01-22 DIAGNOSIS — E118 Type 2 diabetes mellitus with unspecified complications: Secondary | ICD-10-CM | POA: Diagnosis not present

## 2020-01-27 ENCOUNTER — Other Ambulatory Visit: Payer: Self-pay

## 2020-01-27 ENCOUNTER — Ambulatory Visit
Admission: RE | Admit: 2020-01-27 | Discharge: 2020-01-27 | Disposition: A | Discharge status: Home / Self Care | Payer: Medicare Other | Source: Ambulatory Visit | Attending: Hematology and Oncology | Admitting: Hematology and Oncology

## 2020-01-27 DIAGNOSIS — Z1231 Encounter for screening mammogram for malignant neoplasm of breast: Secondary | ICD-10-CM | POA: Diagnosis not present

## 2020-01-27 DIAGNOSIS — C50412 Malignant neoplasm of upper-outer quadrant of left female breast: Secondary | ICD-10-CM

## 2020-01-27 DIAGNOSIS — Z853 Personal history of malignant neoplasm of breast: Secondary | ICD-10-CM

## 2020-02-04 DIAGNOSIS — E118 Type 2 diabetes mellitus with unspecified complications: Secondary | ICD-10-CM | POA: Diagnosis not present

## 2020-02-04 DIAGNOSIS — I10 Essential (primary) hypertension: Secondary | ICD-10-CM | POA: Diagnosis not present

## 2020-02-04 DIAGNOSIS — E782 Mixed hyperlipidemia: Secondary | ICD-10-CM | POA: Diagnosis not present

## 2020-02-04 DIAGNOSIS — Z794 Long term (current) use of insulin: Secondary | ICD-10-CM | POA: Diagnosis not present

## 2020-03-18 ENCOUNTER — Other Ambulatory Visit: Payer: Self-pay | Admitting: Hematology and Oncology

## 2020-03-18 DIAGNOSIS — Z853 Personal history of malignant neoplasm of breast: Secondary | ICD-10-CM

## 2020-03-19 ENCOUNTER — Telehealth: Payer: Self-pay | Admitting: Hematology and Oncology

## 2020-03-19 NOTE — Telephone Encounter (Signed)
Scheduled appts per 3/25 sch msg. Pt requested MyChart appt.  Pt confirmed appt date and time.

## 2020-03-29 NOTE — Progress Notes (Signed)
HEMATOLOGY-ONCOLOGY [facility] VIDEO VISIT PROGRESS NOTE  I connected with Beth Blankenship on 03/30/2020 at 11:00 AM EDT by MyChart video conference and verified that I am speaking with the correct person using two identifiers.  I discussed the limitations, risks, security and privacy concerns of performing an evaluation and management service by MyChart and the availability of in person appointments.  I also discussed with the patient that there may be a patient responsible charge related to this service. The patient expressed understanding and agreed to proceed.  Patient's Location: Home Physician Location: Clinic  CHIEF COMPLIANT: Follow-up of left breast cancer on letrozole  INTERVAL HISTORY: Beth Blankenship is a 69 y.o. female with above-mentioned history of left breast cancer who is currently on letrozole. Mammogram on 01/27/20 showed no evidence of malignancy in the right breast. She presents today over MyChart for annual follow-up.   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 (treatment given at [facility] healthcare: [facility])   03/31/2015 Breast MRI   Large area of non-mass enhancement 7.5 x 4.6 x 4.6 cm, also multiple nonspecific normal appearing lymph nodes in the axilla bilaterally   04/29/2015 Surgery   Left skin sparing mastectomy with immediate reconstruction: Pathology revealed IDC 0.4 cm margins -0/2 lymph nodes negative, ER 0%, PR weakly +11-50%, HER-2/neu 1+, FISH negative, extensive DCIS, T1 a N0 stage I a   05/21/2015 Surgery   Completed left breast reconstruction and right reduction mammoplasty in September 2016   07/01/2015 -  Anti-estrogen oral therapy   Antiestrogen therapy with letrozole 2.5 mg daily      Observations/Objective:  There were no vitals filed for this visit. There is no height or weight on file to calculate BMI.  I have reviewed the data as listed CMP Latest Ref Rng & Units 06/26/2017 06/25/2017 06/24/2017  Glucose 65 - 99 mg/dL 228(H) 99 265(H)  BUN 6 - 20 mg/dL '9 9 7  ' Creatinine 0.44 - 1.00 mg/dL 0.70 0.81 0.60  Sodium 135 - 145 mmol/L 138 137 138  Potassium 3.5 - 5.1 mmol/L 4.2 2.9(L) 2.7(LL)  Chloride 101 - 111 mmol/L 107 106 96(L)  CO2 22 - 32 mmol/L 20(L) 22 -  Calcium 8.9 - 10.3 mg/dL 8.7(L) 8.2(L) -  Total Protein 6.5 - 8.1 g/dL - - -  Total Bilirubin 0.3 - 1.2 mg/dL - - -  Alkaline Phos 38 - 126 U/L - - -  AST 15 - 41 U/L - - -  ALT 14 - 54 U/L - - -    Lab Results  Component Value Date   WBC 5.7 07/09/2017   HGB 11.4 (L) 07/09/2017   HCT 33.7 (L) 07/09/2017   MCV 85.4 07/09/2017   PLT 294.0 07/09/2017   NEUTROABS 13.7 (H) 06/24/2017      Assessment Plan:  Malignant neoplasm of upper-outer quadrant of left breast in female, estrogen receptor negative ([place]) 04/29/2015 left skin sparing mastectomy with immediate reconstruction: Pathology revealed IDC 0.4 cm margins -0/2 lymph nodes negative, ER 0%, PR weakly +11-50%, HER-2/neu 1+, FISH negative, extensive DCIS, T1 a N0 stage I a Treatment was given at [facility] We assumedher care 04/09/2018  Current treatment:Letrozole 2.5 mg daily started 06/29/2015 (will stop in July 2021)  Letrozole Toxicities: Diffuse muscle aches and pains Carpal Tunnel: right  hand Leg: difficulty to walk  Patient takes Fosamax for osteopenia.  Received COVID vaccine  Surveillance: 1.Breast exam 03/30/2020: Benign 2. Right breast mammogram 02/16/2020: Benign breast density category C 3.  08/12/2018: Bone density T score -1.5: Osteopenia  Patient has asthma and diabetes and is trying to stay away from anyone because of coronavirus concerns. Return to clinic once a year for follow-up  I discussed the assessment and  treatment plan with the patient. The patient was provided an opportunity to ask questions and all were answered. The patient agreed with the plan and demonstrated an understanding of the instructions. The patient was advised to call back or seek an in-person evaluation if the symptoms worsen or if the condition fails to improve as anticipated.   I provided 20 minutes of face-to-face MyChart video visit time during this encounter.    Rulon Eisenmenger, MD 03/30/2020   I, Molly Dorshimer, am acting as scribe for Nicholas Lose, MD.  I have reviewed the above documentation for accuracy and completeness, and I agree with the above.

## 2020-03-30 ENCOUNTER — Inpatient Hospital Stay: Payer: Medicare Other | Attending: Hematology and Oncology | Admitting: Hematology and Oncology

## 2020-03-30 DIAGNOSIS — C50412 Malignant neoplasm of upper-outer quadrant of left female breast: Secondary | ICD-10-CM | POA: Diagnosis not present

## 2020-03-30 DIAGNOSIS — Z171 Estrogen receptor negative status [ER-]: Secondary | ICD-10-CM | POA: Insufficient documentation

## 2020-03-30 NOTE — Assessment & Plan Note (Signed)
04/29/2015 left skin sparing mastectomy with immediate reconstruction: Pathology revealed IDC 0.4 cm margins -0/2 lymph nodes negative, ER 0%, PR weakly +11-50%, HER-2/neu 1+, FISH negative, extensive DCIS, T1 a N0 stage I a Treatment was given at [facility] We assumedher care 04/09/2018  Current treatment:Letrozole 2.5 mg daily started 06/29/2015 Letrozole Toxicities:Diffuse muscle aches and pains Carpal Tunnel Pinched nerve Leg  Patient takes Fosamax for osteopenia. She will talk to her primary care physician about getting a bone density test every 2 years.  Surveillance: 1.Breast exam 03/30/2020: Benign 2. Right breast mammogram 02/16/2020: Benign breast density category C 3.  08/12/2018: Bone density T score -1.5: Osteopenia  Patient has asthma and diabetes and is trying to stay away from anyone because of coronavirus concerns. Return to clinic once a year for follow-up

## 2020-04-07 ENCOUNTER — Other Ambulatory Visit: Payer: Self-pay | Admitting: Hematology and Oncology

## 2020-04-07 DIAGNOSIS — Z853 Personal history of malignant neoplasm of breast: Secondary | ICD-10-CM

## 2020-07-13 DIAGNOSIS — Z794 Long term (current) use of insulin: Secondary | ICD-10-CM | POA: Diagnosis not present

## 2020-07-13 DIAGNOSIS — E118 Type 2 diabetes mellitus with unspecified complications: Secondary | ICD-10-CM | POA: Diagnosis not present

## 2020-07-13 DIAGNOSIS — J4520 Mild intermittent asthma, uncomplicated: Secondary | ICD-10-CM | POA: Diagnosis not present

## 2020-07-13 DIAGNOSIS — I10 Essential (primary) hypertension: Secondary | ICD-10-CM | POA: Diagnosis not present

## 2020-08-12 DIAGNOSIS — Z794 Long term (current) use of insulin: Secondary | ICD-10-CM | POA: Diagnosis not present

## 2020-08-12 DIAGNOSIS — R609 Edema, unspecified: Secondary | ICD-10-CM | POA: Diagnosis not present

## 2020-08-12 DIAGNOSIS — E118 Type 2 diabetes mellitus with unspecified complications: Secondary | ICD-10-CM | POA: Diagnosis not present

## 2020-08-12 DIAGNOSIS — I10 Essential (primary) hypertension: Secondary | ICD-10-CM | POA: Diagnosis not present

## 2020-08-12 DIAGNOSIS — E782 Mixed hyperlipidemia: Secondary | ICD-10-CM | POA: Diagnosis not present

## 2020-10-25 DIAGNOSIS — J810 Acute pulmonary edema: Secondary | ICD-10-CM | POA: Diagnosis not present

## 2020-10-25 DIAGNOSIS — R600 Localized edema: Secondary | ICD-10-CM | POA: Diagnosis not present

## 2020-10-25 DIAGNOSIS — R0689 Other abnormalities of breathing: Secondary | ICD-10-CM | POA: Diagnosis not present

## 2020-10-25 DIAGNOSIS — R52 Pain, unspecified: Secondary | ICD-10-CM | POA: Diagnosis not present

## 2020-10-25 DIAGNOSIS — E11649 Type 2 diabetes mellitus with hypoglycemia without coma: Secondary | ICD-10-CM | POA: Diagnosis not present

## 2020-10-25 DIAGNOSIS — E876 Hypokalemia: Secondary | ICD-10-CM | POA: Diagnosis not present

## 2020-10-25 DIAGNOSIS — I5031 Acute diastolic (congestive) heart failure: Secondary | ICD-10-CM | POA: Diagnosis not present

## 2020-10-25 DIAGNOSIS — I313 Pericardial effusion (noninflammatory): Secondary | ICD-10-CM | POA: Diagnosis not present

## 2020-10-25 DIAGNOSIS — R0902 Hypoxemia: Secondary | ICD-10-CM | POA: Diagnosis not present

## 2020-10-25 DIAGNOSIS — M6281 Muscle weakness (generalized): Secondary | ICD-10-CM | POA: Diagnosis not present

## 2020-10-25 DIAGNOSIS — R918 Other nonspecific abnormal finding of lung field: Secondary | ICD-10-CM | POA: Diagnosis not present

## 2020-10-25 DIAGNOSIS — R279 Unspecified lack of coordination: Secondary | ICD-10-CM | POA: Diagnosis not present

## 2020-10-25 DIAGNOSIS — J90 Pleural effusion, not elsewhere classified: Secondary | ICD-10-CM | POA: Diagnosis not present

## 2020-10-25 DIAGNOSIS — I2699 Other pulmonary embolism without acute cor pulmonale: Secondary | ICD-10-CM | POA: Diagnosis not present

## 2020-10-25 DIAGNOSIS — E1169 Type 2 diabetes mellitus with other specified complication: Secondary | ICD-10-CM | POA: Diagnosis not present

## 2020-10-25 DIAGNOSIS — I517 Cardiomegaly: Secondary | ICD-10-CM | POA: Diagnosis not present

## 2020-10-25 DIAGNOSIS — R778 Other specified abnormalities of plasma proteins: Secondary | ICD-10-CM | POA: Diagnosis not present

## 2020-10-25 DIAGNOSIS — R0602 Shortness of breath: Secondary | ICD-10-CM | POA: Diagnosis not present

## 2020-10-25 DIAGNOSIS — I110 Hypertensive heart disease with heart failure: Secondary | ICD-10-CM | POA: Diagnosis not present

## 2020-10-25 DIAGNOSIS — R1312 Dysphagia, oropharyngeal phase: Secondary | ICD-10-CM | POA: Diagnosis not present

## 2020-10-25 DIAGNOSIS — I083 Combined rheumatic disorders of mitral, aortic and tricuspid valves: Secondary | ICD-10-CM | POA: Diagnosis not present

## 2020-10-25 DIAGNOSIS — Z7984 Long term (current) use of oral hypoglycemic drugs: Secondary | ICD-10-CM | POA: Diagnosis not present

## 2020-10-25 DIAGNOSIS — G459 Transient cerebral ischemic attack, unspecified: Secondary | ICD-10-CM | POA: Diagnosis not present

## 2020-10-25 DIAGNOSIS — I10 Essential (primary) hypertension: Secondary | ICD-10-CM | POA: Diagnosis not present

## 2020-10-25 DIAGNOSIS — R079 Chest pain, unspecified: Secondary | ICD-10-CM | POA: Diagnosis not present

## 2020-10-25 DIAGNOSIS — Z87891 Personal history of nicotine dependence: Secondary | ICD-10-CM | POA: Diagnosis not present

## 2020-10-25 DIAGNOSIS — J80 Acute respiratory distress syndrome: Secondary | ICD-10-CM | POA: Diagnosis not present

## 2020-10-25 DIAGNOSIS — D649 Anemia, unspecified: Secondary | ICD-10-CM | POA: Diagnosis not present

## 2020-10-25 DIAGNOSIS — R531 Weakness: Secondary | ICD-10-CM | POA: Diagnosis not present

## 2020-10-25 DIAGNOSIS — J9691 Respiratory failure, unspecified with hypoxia: Secondary | ICD-10-CM | POA: Diagnosis not present

## 2020-10-25 DIAGNOSIS — G4733 Obstructive sleep apnea (adult) (pediatric): Secondary | ICD-10-CM | POA: Diagnosis not present

## 2020-10-25 DIAGNOSIS — J45909 Unspecified asthma, uncomplicated: Secondary | ICD-10-CM | POA: Diagnosis not present

## 2020-10-25 DIAGNOSIS — Z9012 Acquired absence of left breast and nipple: Secondary | ICD-10-CM | POA: Diagnosis not present

## 2020-10-25 DIAGNOSIS — I2510 Atherosclerotic heart disease of native coronary artery without angina pectoris: Secondary | ICD-10-CM | POA: Diagnosis not present

## 2020-10-25 DIAGNOSIS — J9601 Acute respiratory failure with hypoxia: Secondary | ICD-10-CM | POA: Diagnosis not present

## 2020-10-25 DIAGNOSIS — I509 Heart failure, unspecified: Secondary | ICD-10-CM | POA: Diagnosis not present

## 2020-10-25 DIAGNOSIS — R262 Difficulty in walking, not elsewhere classified: Secondary | ICD-10-CM | POA: Diagnosis not present

## 2020-10-25 DIAGNOSIS — Z794 Long term (current) use of insulin: Secondary | ICD-10-CM | POA: Diagnosis not present

## 2020-10-25 DIAGNOSIS — R7989 Other specified abnormal findings of blood chemistry: Secondary | ICD-10-CM | POA: Diagnosis not present

## 2020-10-25 DIAGNOSIS — E782 Mixed hyperlipidemia: Secondary | ICD-10-CM | POA: Diagnosis not present

## 2020-10-25 DIAGNOSIS — Z743 Need for continuous supervision: Secondary | ICD-10-CM | POA: Diagnosis not present

## 2020-10-25 DIAGNOSIS — Z853 Personal history of malignant neoplasm of breast: Secondary | ICD-10-CM | POA: Diagnosis not present

## 2020-10-25 DIAGNOSIS — E785 Hyperlipidemia, unspecified: Secondary | ICD-10-CM | POA: Diagnosis not present

## 2020-10-26 DIAGNOSIS — I5031 Acute diastolic (congestive) heart failure: Secondary | ICD-10-CM | POA: Insufficient documentation

## 2020-11-05 DIAGNOSIS — E782 Mixed hyperlipidemia: Secondary | ICD-10-CM | POA: Diagnosis not present

## 2020-11-05 DIAGNOSIS — E1169 Type 2 diabetes mellitus with other specified complication: Secondary | ICD-10-CM | POA: Diagnosis not present

## 2020-11-05 DIAGNOSIS — R0902 Hypoxemia: Secondary | ICD-10-CM | POA: Diagnosis not present

## 2020-11-05 DIAGNOSIS — R531 Weakness: Secondary | ICD-10-CM | POA: Diagnosis not present

## 2020-11-05 DIAGNOSIS — R262 Difficulty in walking, not elsewhere classified: Secondary | ICD-10-CM | POA: Diagnosis not present

## 2020-11-05 DIAGNOSIS — Z743 Need for continuous supervision: Secondary | ICD-10-CM | POA: Diagnosis not present

## 2020-11-05 DIAGNOSIS — G4733 Obstructive sleep apnea (adult) (pediatric): Secondary | ICD-10-CM | POA: Diagnosis not present

## 2020-11-05 DIAGNOSIS — M6281 Muscle weakness (generalized): Secondary | ICD-10-CM | POA: Diagnosis not present

## 2020-11-05 DIAGNOSIS — J9601 Acute respiratory failure with hypoxia: Secondary | ICD-10-CM | POA: Diagnosis not present

## 2020-11-05 DIAGNOSIS — J810 Acute pulmonary edema: Secondary | ICD-10-CM | POA: Diagnosis not present

## 2020-11-05 DIAGNOSIS — I10 Essential (primary) hypertension: Secondary | ICD-10-CM | POA: Diagnosis not present

## 2020-11-05 DIAGNOSIS — R1312 Dysphagia, oropharyngeal phase: Secondary | ICD-10-CM | POA: Diagnosis not present

## 2020-11-05 DIAGNOSIS — J45909 Unspecified asthma, uncomplicated: Secondary | ICD-10-CM | POA: Diagnosis not present

## 2020-11-05 DIAGNOSIS — Z794 Long term (current) use of insulin: Secondary | ICD-10-CM | POA: Diagnosis not present

## 2020-11-05 DIAGNOSIS — I5031 Acute diastolic (congestive) heart failure: Secondary | ICD-10-CM | POA: Diagnosis not present

## 2020-11-05 DIAGNOSIS — E11649 Type 2 diabetes mellitus with hypoglycemia without coma: Secondary | ICD-10-CM | POA: Diagnosis not present

## 2020-11-05 DIAGNOSIS — R0602 Shortness of breath: Secondary | ICD-10-CM | POA: Diagnosis not present

## 2020-11-05 DIAGNOSIS — R279 Unspecified lack of coordination: Secondary | ICD-10-CM | POA: Diagnosis not present

## 2020-11-05 DIAGNOSIS — Z7984 Long term (current) use of oral hypoglycemic drugs: Secondary | ICD-10-CM | POA: Diagnosis not present

## 2020-11-05 DIAGNOSIS — R609 Edema, unspecified: Secondary | ICD-10-CM | POA: Diagnosis not present

## 2020-11-05 DIAGNOSIS — R5381 Other malaise: Secondary | ICD-10-CM | POA: Diagnosis not present

## 2020-11-05 DIAGNOSIS — G459 Transient cerebral ischemic attack, unspecified: Secondary | ICD-10-CM | POA: Diagnosis not present

## 2020-11-05 DIAGNOSIS — E118 Type 2 diabetes mellitus with unspecified complications: Secondary | ICD-10-CM | POA: Diagnosis not present

## 2020-11-05 DIAGNOSIS — Z87891 Personal history of nicotine dependence: Secondary | ICD-10-CM | POA: Diagnosis not present

## 2020-11-06 DIAGNOSIS — I10 Essential (primary) hypertension: Secondary | ICD-10-CM | POA: Diagnosis not present

## 2020-11-06 DIAGNOSIS — E1169 Type 2 diabetes mellitus with other specified complication: Secondary | ICD-10-CM | POA: Diagnosis not present

## 2020-11-06 DIAGNOSIS — I5031 Acute diastolic (congestive) heart failure: Secondary | ICD-10-CM | POA: Diagnosis not present

## 2020-11-06 DIAGNOSIS — E118 Type 2 diabetes mellitus with unspecified complications: Secondary | ICD-10-CM | POA: Diagnosis not present

## 2020-11-06 DIAGNOSIS — J9601 Acute respiratory failure with hypoxia: Secondary | ICD-10-CM | POA: Diagnosis not present

## 2020-11-11 DIAGNOSIS — E118 Type 2 diabetes mellitus with unspecified complications: Secondary | ICD-10-CM | POA: Diagnosis not present

## 2020-11-11 DIAGNOSIS — I10 Essential (primary) hypertension: Secondary | ICD-10-CM | POA: Diagnosis not present

## 2020-11-11 DIAGNOSIS — R609 Edema, unspecified: Secondary | ICD-10-CM | POA: Diagnosis not present

## 2020-11-11 DIAGNOSIS — I5031 Acute diastolic (congestive) heart failure: Secondary | ICD-10-CM | POA: Diagnosis not present

## 2020-11-11 DIAGNOSIS — J9601 Acute respiratory failure with hypoxia: Secondary | ICD-10-CM | POA: Diagnosis not present

## 2020-11-15 DIAGNOSIS — J9601 Acute respiratory failure with hypoxia: Secondary | ICD-10-CM | POA: Diagnosis not present

## 2020-11-15 DIAGNOSIS — I5031 Acute diastolic (congestive) heart failure: Secondary | ICD-10-CM | POA: Diagnosis not present

## 2020-11-15 DIAGNOSIS — I10 Essential (primary) hypertension: Secondary | ICD-10-CM | POA: Diagnosis not present

## 2020-11-15 DIAGNOSIS — E11649 Type 2 diabetes mellitus with hypoglycemia without coma: Secondary | ICD-10-CM | POA: Diagnosis not present

## 2020-11-15 DIAGNOSIS — R609 Edema, unspecified: Secondary | ICD-10-CM | POA: Diagnosis not present

## 2020-11-16 DIAGNOSIS — J810 Acute pulmonary edema: Secondary | ICD-10-CM | POA: Diagnosis not present

## 2020-11-16 DIAGNOSIS — R0602 Shortness of breath: Secondary | ICD-10-CM | POA: Diagnosis not present

## 2020-11-16 DIAGNOSIS — R5381 Other malaise: Secondary | ICD-10-CM | POA: Diagnosis not present

## 2020-11-16 DIAGNOSIS — I10 Essential (primary) hypertension: Secondary | ICD-10-CM | POA: Diagnosis not present

## 2020-11-16 DIAGNOSIS — J9601 Acute respiratory failure with hypoxia: Secondary | ICD-10-CM | POA: Diagnosis not present

## 2020-11-17 DIAGNOSIS — I10 Essential (primary) hypertension: Secondary | ICD-10-CM | POA: Diagnosis not present

## 2020-11-17 DIAGNOSIS — J9601 Acute respiratory failure with hypoxia: Secondary | ICD-10-CM | POA: Diagnosis not present

## 2020-11-17 DIAGNOSIS — I5031 Acute diastolic (congestive) heart failure: Secondary | ICD-10-CM | POA: Diagnosis not present

## 2020-11-21 DIAGNOSIS — Z794 Long term (current) use of insulin: Secondary | ICD-10-CM | POA: Diagnosis not present

## 2020-11-21 DIAGNOSIS — J9601 Acute respiratory failure with hypoxia: Secondary | ICD-10-CM | POA: Diagnosis not present

## 2020-11-21 DIAGNOSIS — Z9012 Acquired absence of left breast and nipple: Secondary | ICD-10-CM | POA: Diagnosis not present

## 2020-11-21 DIAGNOSIS — Z87891 Personal history of nicotine dependence: Secondary | ICD-10-CM | POA: Diagnosis not present

## 2020-11-21 DIAGNOSIS — M1990 Unspecified osteoarthritis, unspecified site: Secondary | ICD-10-CM | POA: Diagnosis not present

## 2020-11-21 DIAGNOSIS — I501 Left ventricular failure: Secondary | ICD-10-CM | POA: Diagnosis not present

## 2020-11-21 DIAGNOSIS — E782 Mixed hyperlipidemia: Secondary | ICD-10-CM | POA: Diagnosis not present

## 2020-11-21 DIAGNOSIS — Z8673 Personal history of transient ischemic attack (TIA), and cerebral infarction without residual deficits: Secondary | ICD-10-CM | POA: Diagnosis not present

## 2020-11-21 DIAGNOSIS — I5031 Acute diastolic (congestive) heart failure: Secondary | ICD-10-CM | POA: Diagnosis not present

## 2020-11-21 DIAGNOSIS — H5462 Unqualified visual loss, left eye, normal vision right eye: Secondary | ICD-10-CM | POA: Diagnosis not present

## 2020-11-21 DIAGNOSIS — I313 Pericardial effusion (noninflammatory): Secondary | ICD-10-CM | POA: Diagnosis not present

## 2020-11-21 DIAGNOSIS — Z9981 Dependence on supplemental oxygen: Secondary | ICD-10-CM | POA: Diagnosis not present

## 2020-11-21 DIAGNOSIS — I110 Hypertensive heart disease with heart failure: Secondary | ICD-10-CM | POA: Diagnosis not present

## 2020-11-21 DIAGNOSIS — Z7984 Long term (current) use of oral hypoglycemic drugs: Secondary | ICD-10-CM | POA: Diagnosis not present

## 2020-11-21 DIAGNOSIS — Z853 Personal history of malignant neoplasm of breast: Secondary | ICD-10-CM | POA: Diagnosis not present

## 2020-11-21 DIAGNOSIS — J4520 Mild intermittent asthma, uncomplicated: Secondary | ICD-10-CM | POA: Diagnosis not present

## 2020-11-21 DIAGNOSIS — E876 Hypokalemia: Secondary | ICD-10-CM | POA: Diagnosis not present

## 2020-11-21 DIAGNOSIS — I272 Pulmonary hypertension, unspecified: Secondary | ICD-10-CM | POA: Diagnosis not present

## 2020-11-21 DIAGNOSIS — E11649 Type 2 diabetes mellitus with hypoglycemia without coma: Secondary | ICD-10-CM | POA: Diagnosis not present

## 2020-11-22 DIAGNOSIS — J4520 Mild intermittent asthma, uncomplicated: Secondary | ICD-10-CM | POA: Diagnosis not present

## 2020-11-22 DIAGNOSIS — Z7984 Long term (current) use of oral hypoglycemic drugs: Secondary | ICD-10-CM | POA: Diagnosis not present

## 2020-11-22 DIAGNOSIS — Z9981 Dependence on supplemental oxygen: Secondary | ICD-10-CM | POA: Diagnosis not present

## 2020-11-22 DIAGNOSIS — H5462 Unqualified visual loss, left eye, normal vision right eye: Secondary | ICD-10-CM | POA: Diagnosis not present

## 2020-11-22 DIAGNOSIS — I313 Pericardial effusion (noninflammatory): Secondary | ICD-10-CM | POA: Diagnosis not present

## 2020-11-22 DIAGNOSIS — J9601 Acute respiratory failure with hypoxia: Secondary | ICD-10-CM | POA: Diagnosis not present

## 2020-11-22 DIAGNOSIS — I501 Left ventricular failure: Secondary | ICD-10-CM | POA: Diagnosis not present

## 2020-11-22 DIAGNOSIS — E11649 Type 2 diabetes mellitus with hypoglycemia without coma: Secondary | ICD-10-CM | POA: Diagnosis not present

## 2020-11-22 DIAGNOSIS — I272 Pulmonary hypertension, unspecified: Secondary | ICD-10-CM | POA: Diagnosis not present

## 2020-11-22 DIAGNOSIS — Z87891 Personal history of nicotine dependence: Secondary | ICD-10-CM | POA: Diagnosis not present

## 2020-11-22 DIAGNOSIS — Z853 Personal history of malignant neoplasm of breast: Secondary | ICD-10-CM | POA: Diagnosis not present

## 2020-11-22 DIAGNOSIS — Z794 Long term (current) use of insulin: Secondary | ICD-10-CM | POA: Diagnosis not present

## 2020-11-22 DIAGNOSIS — Z9012 Acquired absence of left breast and nipple: Secondary | ICD-10-CM | POA: Diagnosis not present

## 2020-11-22 DIAGNOSIS — I5031 Acute diastolic (congestive) heart failure: Secondary | ICD-10-CM | POA: Diagnosis not present

## 2020-11-22 DIAGNOSIS — I110 Hypertensive heart disease with heart failure: Secondary | ICD-10-CM | POA: Diagnosis not present

## 2020-11-22 DIAGNOSIS — M1990 Unspecified osteoarthritis, unspecified site: Secondary | ICD-10-CM | POA: Diagnosis not present

## 2020-11-22 DIAGNOSIS — E876 Hypokalemia: Secondary | ICD-10-CM | POA: Diagnosis not present

## 2020-11-22 DIAGNOSIS — Z8673 Personal history of transient ischemic attack (TIA), and cerebral infarction without residual deficits: Secondary | ICD-10-CM | POA: Diagnosis not present

## 2020-11-22 DIAGNOSIS — E782 Mixed hyperlipidemia: Secondary | ICD-10-CM | POA: Diagnosis not present

## 2020-11-24 DIAGNOSIS — R609 Edema, unspecified: Secondary | ICD-10-CM | POA: Diagnosis not present

## 2020-11-24 DIAGNOSIS — I5032 Chronic diastolic (congestive) heart failure: Secondary | ICD-10-CM | POA: Diagnosis not present

## 2020-11-24 DIAGNOSIS — I2584 Coronary atherosclerosis due to calcified coronary lesion: Secondary | ICD-10-CM | POA: Diagnosis not present

## 2020-11-24 DIAGNOSIS — I10 Essential (primary) hypertension: Secondary | ICD-10-CM | POA: Diagnosis not present

## 2020-11-24 DIAGNOSIS — I2510 Atherosclerotic heart disease of native coronary artery without angina pectoris: Secondary | ICD-10-CM | POA: Diagnosis not present

## 2020-11-26 DIAGNOSIS — R6889 Other general symptoms and signs: Secondary | ICD-10-CM | POA: Diagnosis not present

## 2020-11-26 DIAGNOSIS — K7689 Other specified diseases of liver: Secondary | ICD-10-CM | POA: Diagnosis not present

## 2020-11-26 DIAGNOSIS — R918 Other nonspecific abnormal finding of lung field: Secondary | ICD-10-CM | POA: Diagnosis not present

## 2020-11-26 DIAGNOSIS — I517 Cardiomegaly: Secondary | ICD-10-CM | POA: Diagnosis not present

## 2020-11-26 DIAGNOSIS — R109 Unspecified abdominal pain: Secondary | ICD-10-CM | POA: Diagnosis not present

## 2020-11-26 DIAGNOSIS — Z743 Need for continuous supervision: Secondary | ICD-10-CM | POA: Diagnosis not present

## 2020-11-26 DIAGNOSIS — Z794 Long term (current) use of insulin: Secondary | ICD-10-CM | POA: Diagnosis not present

## 2020-11-26 DIAGNOSIS — J45909 Unspecified asthma, uncomplicated: Secondary | ICD-10-CM | POA: Diagnosis not present

## 2020-11-26 DIAGNOSIS — N281 Cyst of kidney, acquired: Secondary | ICD-10-CM | POA: Diagnosis not present

## 2020-11-26 DIAGNOSIS — R1032 Left lower quadrant pain: Secondary | ICD-10-CM | POA: Diagnosis not present

## 2020-11-26 DIAGNOSIS — I10 Essential (primary) hypertension: Secondary | ICD-10-CM | POA: Diagnosis not present

## 2020-11-26 DIAGNOSIS — Z87891 Personal history of nicotine dependence: Secondary | ICD-10-CM | POA: Diagnosis not present

## 2020-11-26 DIAGNOSIS — R1012 Left upper quadrant pain: Secondary | ICD-10-CM | POA: Diagnosis not present

## 2020-11-26 DIAGNOSIS — R0902 Hypoxemia: Secondary | ICD-10-CM | POA: Diagnosis not present

## 2020-11-26 DIAGNOSIS — Z91040 Latex allergy status: Secondary | ICD-10-CM | POA: Diagnosis not present

## 2020-11-26 DIAGNOSIS — Z7982 Long term (current) use of aspirin: Secondary | ICD-10-CM | POA: Diagnosis not present

## 2020-11-26 DIAGNOSIS — R0789 Other chest pain: Secondary | ICD-10-CM | POA: Diagnosis not present

## 2020-11-26 DIAGNOSIS — Z79899 Other long term (current) drug therapy: Secondary | ICD-10-CM | POA: Diagnosis not present

## 2020-11-26 DIAGNOSIS — Z881 Allergy status to other antibiotic agents status: Secondary | ICD-10-CM | POA: Diagnosis not present

## 2020-11-26 DIAGNOSIS — E119 Type 2 diabetes mellitus without complications: Secondary | ICD-10-CM | POA: Diagnosis not present

## 2020-11-26 DIAGNOSIS — R7989 Other specified abnormal findings of blood chemistry: Secondary | ICD-10-CM | POA: Diagnosis not present

## 2020-11-26 DIAGNOSIS — R079 Chest pain, unspecified: Secondary | ICD-10-CM | POA: Diagnosis not present

## 2020-11-29 DIAGNOSIS — E11649 Type 2 diabetes mellitus with hypoglycemia without coma: Secondary | ICD-10-CM | POA: Diagnosis not present

## 2020-11-29 DIAGNOSIS — I313 Pericardial effusion (noninflammatory): Secondary | ICD-10-CM | POA: Diagnosis not present

## 2020-11-29 DIAGNOSIS — J4520 Mild intermittent asthma, uncomplicated: Secondary | ICD-10-CM | POA: Diagnosis not present

## 2020-11-29 DIAGNOSIS — I110 Hypertensive heart disease with heart failure: Secondary | ICD-10-CM | POA: Diagnosis not present

## 2020-11-29 DIAGNOSIS — J9601 Acute respiratory failure with hypoxia: Secondary | ICD-10-CM | POA: Diagnosis not present

## 2020-11-29 DIAGNOSIS — E876 Hypokalemia: Secondary | ICD-10-CM | POA: Diagnosis not present

## 2020-11-29 DIAGNOSIS — H5462 Unqualified visual loss, left eye, normal vision right eye: Secondary | ICD-10-CM | POA: Diagnosis not present

## 2020-11-29 DIAGNOSIS — Z9012 Acquired absence of left breast and nipple: Secondary | ICD-10-CM | POA: Diagnosis not present

## 2020-11-29 DIAGNOSIS — E782 Mixed hyperlipidemia: Secondary | ICD-10-CM | POA: Diagnosis not present

## 2020-11-29 DIAGNOSIS — M1990 Unspecified osteoarthritis, unspecified site: Secondary | ICD-10-CM | POA: Diagnosis not present

## 2020-11-29 DIAGNOSIS — I5031 Acute diastolic (congestive) heart failure: Secondary | ICD-10-CM | POA: Diagnosis not present

## 2020-11-29 DIAGNOSIS — Z87891 Personal history of nicotine dependence: Secondary | ICD-10-CM | POA: Diagnosis not present

## 2020-11-29 DIAGNOSIS — Z7984 Long term (current) use of oral hypoglycemic drugs: Secondary | ICD-10-CM | POA: Diagnosis not present

## 2020-11-29 DIAGNOSIS — I501 Left ventricular failure: Secondary | ICD-10-CM | POA: Diagnosis not present

## 2020-11-29 DIAGNOSIS — Z794 Long term (current) use of insulin: Secondary | ICD-10-CM | POA: Diagnosis not present

## 2020-11-29 DIAGNOSIS — I272 Pulmonary hypertension, unspecified: Secondary | ICD-10-CM | POA: Diagnosis not present

## 2020-11-29 DIAGNOSIS — Z9981 Dependence on supplemental oxygen: Secondary | ICD-10-CM | POA: Diagnosis not present

## 2020-11-29 DIAGNOSIS — Z853 Personal history of malignant neoplasm of breast: Secondary | ICD-10-CM | POA: Diagnosis not present

## 2020-11-29 DIAGNOSIS — Z8673 Personal history of transient ischemic attack (TIA), and cerebral infarction without residual deficits: Secondary | ICD-10-CM | POA: Diagnosis not present

## 2020-11-30 DIAGNOSIS — E876 Hypokalemia: Secondary | ICD-10-CM | POA: Diagnosis not present

## 2020-11-30 DIAGNOSIS — Z9012 Acquired absence of left breast and nipple: Secondary | ICD-10-CM | POA: Diagnosis not present

## 2020-11-30 DIAGNOSIS — E782 Mixed hyperlipidemia: Secondary | ICD-10-CM | POA: Diagnosis not present

## 2020-11-30 DIAGNOSIS — E11649 Type 2 diabetes mellitus with hypoglycemia without coma: Secondary | ICD-10-CM | POA: Diagnosis not present

## 2020-11-30 DIAGNOSIS — I10 Essential (primary) hypertension: Secondary | ICD-10-CM | POA: Diagnosis not present

## 2020-11-30 DIAGNOSIS — J9601 Acute respiratory failure with hypoxia: Secondary | ICD-10-CM | POA: Diagnosis not present

## 2020-11-30 DIAGNOSIS — I5031 Acute diastolic (congestive) heart failure: Secondary | ICD-10-CM | POA: Diagnosis not present

## 2020-11-30 DIAGNOSIS — Z8673 Personal history of transient ischemic attack (TIA), and cerebral infarction without residual deficits: Secondary | ICD-10-CM | POA: Diagnosis not present

## 2020-11-30 DIAGNOSIS — I110 Hypertensive heart disease with heart failure: Secondary | ICD-10-CM | POA: Diagnosis not present

## 2020-11-30 DIAGNOSIS — J4520 Mild intermittent asthma, uncomplicated: Secondary | ICD-10-CM | POA: Diagnosis not present

## 2020-11-30 DIAGNOSIS — I313 Pericardial effusion (noninflammatory): Secondary | ICD-10-CM | POA: Diagnosis not present

## 2020-11-30 DIAGNOSIS — Z9981 Dependence on supplemental oxygen: Secondary | ICD-10-CM | POA: Diagnosis not present

## 2020-11-30 DIAGNOSIS — E118 Type 2 diabetes mellitus with unspecified complications: Secondary | ICD-10-CM | POA: Diagnosis not present

## 2020-11-30 DIAGNOSIS — Z87891 Personal history of nicotine dependence: Secondary | ICD-10-CM | POA: Diagnosis not present

## 2020-11-30 DIAGNOSIS — Z853 Personal history of malignant neoplasm of breast: Secondary | ICD-10-CM | POA: Diagnosis not present

## 2020-11-30 DIAGNOSIS — Z794 Long term (current) use of insulin: Secondary | ICD-10-CM | POA: Diagnosis not present

## 2020-11-30 DIAGNOSIS — I5033 Acute on chronic diastolic (congestive) heart failure: Secondary | ICD-10-CM | POA: Diagnosis not present

## 2020-11-30 DIAGNOSIS — H5462 Unqualified visual loss, left eye, normal vision right eye: Secondary | ICD-10-CM | POA: Diagnosis not present

## 2020-11-30 DIAGNOSIS — I501 Left ventricular failure: Secondary | ICD-10-CM | POA: Diagnosis not present

## 2020-11-30 DIAGNOSIS — Z7984 Long term (current) use of oral hypoglycemic drugs: Secondary | ICD-10-CM | POA: Diagnosis not present

## 2020-11-30 DIAGNOSIS — I272 Pulmonary hypertension, unspecified: Secondary | ICD-10-CM | POA: Diagnosis not present

## 2020-11-30 DIAGNOSIS — M1990 Unspecified osteoarthritis, unspecified site: Secondary | ICD-10-CM | POA: Diagnosis not present

## 2020-12-02 DIAGNOSIS — G319 Degenerative disease of nervous system, unspecified: Secondary | ICD-10-CM | POA: Diagnosis not present

## 2020-12-02 DIAGNOSIS — I509 Heart failure, unspecified: Secondary | ICD-10-CM | POA: Diagnosis not present

## 2020-12-02 DIAGNOSIS — J9601 Acute respiratory failure with hypoxia: Secondary | ICD-10-CM | POA: Diagnosis not present

## 2020-12-02 DIAGNOSIS — N390 Urinary tract infection, site not specified: Secondary | ICD-10-CM | POA: Diagnosis not present

## 2020-12-02 DIAGNOSIS — R0603 Acute respiratory distress: Secondary | ICD-10-CM | POA: Diagnosis not present

## 2020-12-02 DIAGNOSIS — I6782 Cerebral ischemia: Secondary | ICD-10-CM | POA: Diagnosis not present

## 2020-12-02 DIAGNOSIS — Z794 Long term (current) use of insulin: Secondary | ICD-10-CM | POA: Diagnosis not present

## 2020-12-02 DIAGNOSIS — H5462 Unqualified visual loss, left eye, normal vision right eye: Secondary | ICD-10-CM | POA: Diagnosis not present

## 2020-12-02 DIAGNOSIS — Z79899 Other long term (current) drug therapy: Secondary | ICD-10-CM | POA: Diagnosis not present

## 2020-12-02 DIAGNOSIS — Z9012 Acquired absence of left breast and nipple: Secondary | ICD-10-CM | POA: Diagnosis not present

## 2020-12-02 DIAGNOSIS — I313 Pericardial effusion (noninflammatory): Secondary | ICD-10-CM | POA: Diagnosis not present

## 2020-12-02 DIAGNOSIS — I110 Hypertensive heart disease with heart failure: Secondary | ICD-10-CM | POA: Diagnosis not present

## 2020-12-02 DIAGNOSIS — J45909 Unspecified asthma, uncomplicated: Secondary | ICD-10-CM | POA: Diagnosis not present

## 2020-12-02 DIAGNOSIS — E11649 Type 2 diabetes mellitus with hypoglycemia without coma: Secondary | ICD-10-CM | POA: Diagnosis not present

## 2020-12-02 DIAGNOSIS — Z87891 Personal history of nicotine dependence: Secondary | ICD-10-CM | POA: Diagnosis not present

## 2020-12-02 DIAGNOSIS — Z7984 Long term (current) use of oral hypoglycemic drugs: Secondary | ICD-10-CM | POA: Diagnosis not present

## 2020-12-02 DIAGNOSIS — Z9981 Dependence on supplemental oxygen: Secondary | ICD-10-CM | POA: Diagnosis not present

## 2020-12-02 DIAGNOSIS — R410 Disorientation, unspecified: Secondary | ICD-10-CM | POA: Diagnosis not present

## 2020-12-02 DIAGNOSIS — I6789 Other cerebrovascular disease: Secondary | ICD-10-CM | POA: Diagnosis not present

## 2020-12-02 DIAGNOSIS — Z881 Allergy status to other antibiotic agents status: Secondary | ICD-10-CM | POA: Diagnosis not present

## 2020-12-02 DIAGNOSIS — I272 Pulmonary hypertension, unspecified: Secondary | ICD-10-CM | POA: Diagnosis not present

## 2020-12-02 DIAGNOSIS — I679 Cerebrovascular disease, unspecified: Secondary | ICD-10-CM | POA: Diagnosis not present

## 2020-12-02 DIAGNOSIS — E782 Mixed hyperlipidemia: Secondary | ICD-10-CM | POA: Diagnosis not present

## 2020-12-02 DIAGNOSIS — I5031 Acute diastolic (congestive) heart failure: Secondary | ICD-10-CM | POA: Diagnosis not present

## 2020-12-02 DIAGNOSIS — Z7982 Long term (current) use of aspirin: Secondary | ICD-10-CM | POA: Diagnosis not present

## 2020-12-02 DIAGNOSIS — M1990 Unspecified osteoarthritis, unspecified site: Secondary | ICD-10-CM | POA: Diagnosis not present

## 2020-12-02 DIAGNOSIS — Z8673 Personal history of transient ischemic attack (TIA), and cerebral infarction without residual deficits: Secondary | ICD-10-CM | POA: Diagnosis not present

## 2020-12-02 DIAGNOSIS — E119 Type 2 diabetes mellitus without complications: Secondary | ICD-10-CM | POA: Diagnosis not present

## 2020-12-02 DIAGNOSIS — Z91040 Latex allergy status: Secondary | ICD-10-CM | POA: Diagnosis not present

## 2020-12-02 DIAGNOSIS — E876 Hypokalemia: Secondary | ICD-10-CM | POA: Diagnosis not present

## 2020-12-02 DIAGNOSIS — Z853 Personal history of malignant neoplasm of breast: Secondary | ICD-10-CM | POA: Diagnosis not present

## 2020-12-02 DIAGNOSIS — I501 Left ventricular failure: Secondary | ICD-10-CM | POA: Diagnosis not present

## 2020-12-02 DIAGNOSIS — J4520 Mild intermittent asthma, uncomplicated: Secondary | ICD-10-CM | POA: Diagnosis not present

## 2020-12-03 DIAGNOSIS — F29 Unspecified psychosis not due to a substance or known physiological condition: Secondary | ICD-10-CM | POA: Insufficient documentation

## 2020-12-03 DIAGNOSIS — F05 Delirium due to known physiological condition: Secondary | ICD-10-CM | POA: Insufficient documentation

## 2020-12-03 DIAGNOSIS — R4182 Altered mental status, unspecified: Secondary | ICD-10-CM | POA: Insufficient documentation

## 2020-12-06 DIAGNOSIS — I110 Hypertensive heart disease with heart failure: Secondary | ICD-10-CM | POA: Diagnosis not present

## 2020-12-06 DIAGNOSIS — J4520 Mild intermittent asthma, uncomplicated: Secondary | ICD-10-CM | POA: Diagnosis not present

## 2020-12-06 DIAGNOSIS — Z9981 Dependence on supplemental oxygen: Secondary | ICD-10-CM | POA: Diagnosis not present

## 2020-12-06 DIAGNOSIS — Z794 Long term (current) use of insulin: Secondary | ICD-10-CM | POA: Diagnosis not present

## 2020-12-06 DIAGNOSIS — I272 Pulmonary hypertension, unspecified: Secondary | ICD-10-CM | POA: Diagnosis not present

## 2020-12-06 DIAGNOSIS — Z7984 Long term (current) use of oral hypoglycemic drugs: Secondary | ICD-10-CM | POA: Diagnosis not present

## 2020-12-06 DIAGNOSIS — J9601 Acute respiratory failure with hypoxia: Secondary | ICD-10-CM | POA: Diagnosis not present

## 2020-12-06 DIAGNOSIS — E876 Hypokalemia: Secondary | ICD-10-CM | POA: Diagnosis not present

## 2020-12-06 DIAGNOSIS — Z9012 Acquired absence of left breast and nipple: Secondary | ICD-10-CM | POA: Diagnosis not present

## 2020-12-06 DIAGNOSIS — I5031 Acute diastolic (congestive) heart failure: Secondary | ICD-10-CM | POA: Diagnosis not present

## 2020-12-06 DIAGNOSIS — Z87891 Personal history of nicotine dependence: Secondary | ICD-10-CM | POA: Diagnosis not present

## 2020-12-06 DIAGNOSIS — M1990 Unspecified osteoarthritis, unspecified site: Secondary | ICD-10-CM | POA: Diagnosis not present

## 2020-12-06 DIAGNOSIS — I501 Left ventricular failure: Secondary | ICD-10-CM | POA: Diagnosis not present

## 2020-12-06 DIAGNOSIS — E11649 Type 2 diabetes mellitus with hypoglycemia without coma: Secondary | ICD-10-CM | POA: Diagnosis not present

## 2020-12-06 DIAGNOSIS — E782 Mixed hyperlipidemia: Secondary | ICD-10-CM | POA: Diagnosis not present

## 2020-12-06 DIAGNOSIS — H5462 Unqualified visual loss, left eye, normal vision right eye: Secondary | ICD-10-CM | POA: Diagnosis not present

## 2020-12-06 DIAGNOSIS — I313 Pericardial effusion (noninflammatory): Secondary | ICD-10-CM | POA: Diagnosis not present

## 2020-12-06 DIAGNOSIS — Z853 Personal history of malignant neoplasm of breast: Secondary | ICD-10-CM | POA: Diagnosis not present

## 2020-12-06 DIAGNOSIS — Z8673 Personal history of transient ischemic attack (TIA), and cerebral infarction without residual deficits: Secondary | ICD-10-CM | POA: Diagnosis not present

## 2020-12-07 DIAGNOSIS — J4520 Mild intermittent asthma, uncomplicated: Secondary | ICD-10-CM | POA: Diagnosis not present

## 2020-12-07 DIAGNOSIS — Z8673 Personal history of transient ischemic attack (TIA), and cerebral infarction without residual deficits: Secondary | ICD-10-CM | POA: Diagnosis not present

## 2020-12-07 DIAGNOSIS — Z853 Personal history of malignant neoplasm of breast: Secondary | ICD-10-CM | POA: Diagnosis not present

## 2020-12-07 DIAGNOSIS — Z9981 Dependence on supplemental oxygen: Secondary | ICD-10-CM | POA: Diagnosis not present

## 2020-12-07 DIAGNOSIS — I5031 Acute diastolic (congestive) heart failure: Secondary | ICD-10-CM | POA: Diagnosis not present

## 2020-12-07 DIAGNOSIS — E782 Mixed hyperlipidemia: Secondary | ICD-10-CM | POA: Diagnosis not present

## 2020-12-07 DIAGNOSIS — Z7984 Long term (current) use of oral hypoglycemic drugs: Secondary | ICD-10-CM | POA: Diagnosis not present

## 2020-12-07 DIAGNOSIS — J9601 Acute respiratory failure with hypoxia: Secondary | ICD-10-CM | POA: Diagnosis not present

## 2020-12-07 DIAGNOSIS — I313 Pericardial effusion (noninflammatory): Secondary | ICD-10-CM | POA: Diagnosis not present

## 2020-12-07 DIAGNOSIS — E11649 Type 2 diabetes mellitus with hypoglycemia without coma: Secondary | ICD-10-CM | POA: Diagnosis not present

## 2020-12-07 DIAGNOSIS — H5462 Unqualified visual loss, left eye, normal vision right eye: Secondary | ICD-10-CM | POA: Diagnosis not present

## 2020-12-07 DIAGNOSIS — M1990 Unspecified osteoarthritis, unspecified site: Secondary | ICD-10-CM | POA: Diagnosis not present

## 2020-12-07 DIAGNOSIS — Z794 Long term (current) use of insulin: Secondary | ICD-10-CM | POA: Diagnosis not present

## 2020-12-07 DIAGNOSIS — I501 Left ventricular failure: Secondary | ICD-10-CM | POA: Diagnosis not present

## 2020-12-07 DIAGNOSIS — I110 Hypertensive heart disease with heart failure: Secondary | ICD-10-CM | POA: Diagnosis not present

## 2020-12-07 DIAGNOSIS — E876 Hypokalemia: Secondary | ICD-10-CM | POA: Diagnosis not present

## 2020-12-07 DIAGNOSIS — Z87891 Personal history of nicotine dependence: Secondary | ICD-10-CM | POA: Diagnosis not present

## 2020-12-07 DIAGNOSIS — Z9012 Acquired absence of left breast and nipple: Secondary | ICD-10-CM | POA: Diagnosis not present

## 2020-12-07 DIAGNOSIS — I272 Pulmonary hypertension, unspecified: Secondary | ICD-10-CM | POA: Diagnosis not present

## 2020-12-09 DIAGNOSIS — I501 Left ventricular failure: Secondary | ICD-10-CM | POA: Diagnosis not present

## 2020-12-09 DIAGNOSIS — Z87891 Personal history of nicotine dependence: Secondary | ICD-10-CM | POA: Diagnosis not present

## 2020-12-09 DIAGNOSIS — Z9012 Acquired absence of left breast and nipple: Secondary | ICD-10-CM | POA: Diagnosis not present

## 2020-12-09 DIAGNOSIS — E876 Hypokalemia: Secondary | ICD-10-CM | POA: Diagnosis not present

## 2020-12-09 DIAGNOSIS — E11649 Type 2 diabetes mellitus with hypoglycemia without coma: Secondary | ICD-10-CM | POA: Diagnosis not present

## 2020-12-09 DIAGNOSIS — J9601 Acute respiratory failure with hypoxia: Secondary | ICD-10-CM | POA: Diagnosis not present

## 2020-12-09 DIAGNOSIS — I313 Pericardial effusion (noninflammatory): Secondary | ICD-10-CM | POA: Diagnosis not present

## 2020-12-09 DIAGNOSIS — I5031 Acute diastolic (congestive) heart failure: Secondary | ICD-10-CM | POA: Diagnosis not present

## 2020-12-09 DIAGNOSIS — Z9981 Dependence on supplemental oxygen: Secondary | ICD-10-CM | POA: Diagnosis not present

## 2020-12-09 DIAGNOSIS — Z87440 Personal history of urinary (tract) infections: Secondary | ICD-10-CM | POA: Diagnosis not present

## 2020-12-09 DIAGNOSIS — Z794 Long term (current) use of insulin: Secondary | ICD-10-CM | POA: Diagnosis not present

## 2020-12-09 DIAGNOSIS — I272 Pulmonary hypertension, unspecified: Secondary | ICD-10-CM | POA: Diagnosis not present

## 2020-12-09 DIAGNOSIS — E782 Mixed hyperlipidemia: Secondary | ICD-10-CM | POA: Diagnosis not present

## 2020-12-09 DIAGNOSIS — Z7984 Long term (current) use of oral hypoglycemic drugs: Secondary | ICD-10-CM | POA: Diagnosis not present

## 2020-12-09 DIAGNOSIS — R410 Disorientation, unspecified: Secondary | ICD-10-CM | POA: Diagnosis not present

## 2020-12-09 DIAGNOSIS — H5462 Unqualified visual loss, left eye, normal vision right eye: Secondary | ICD-10-CM | POA: Diagnosis not present

## 2020-12-09 DIAGNOSIS — Z853 Personal history of malignant neoplasm of breast: Secondary | ICD-10-CM | POA: Diagnosis not present

## 2020-12-09 DIAGNOSIS — M1990 Unspecified osteoarthritis, unspecified site: Secondary | ICD-10-CM | POA: Diagnosis not present

## 2020-12-09 DIAGNOSIS — I110 Hypertensive heart disease with heart failure: Secondary | ICD-10-CM | POA: Diagnosis not present

## 2020-12-09 DIAGNOSIS — J4520 Mild intermittent asthma, uncomplicated: Secondary | ICD-10-CM | POA: Diagnosis not present

## 2020-12-09 DIAGNOSIS — Z8673 Personal history of transient ischemic attack (TIA), and cerebral infarction without residual deficits: Secondary | ICD-10-CM | POA: Diagnosis not present

## 2020-12-10 DIAGNOSIS — I313 Pericardial effusion (noninflammatory): Secondary | ICD-10-CM | POA: Diagnosis not present

## 2020-12-10 DIAGNOSIS — H5462 Unqualified visual loss, left eye, normal vision right eye: Secondary | ICD-10-CM | POA: Diagnosis not present

## 2020-12-10 DIAGNOSIS — J4520 Mild intermittent asthma, uncomplicated: Secondary | ICD-10-CM | POA: Diagnosis not present

## 2020-12-10 DIAGNOSIS — I272 Pulmonary hypertension, unspecified: Secondary | ICD-10-CM | POA: Diagnosis not present

## 2020-12-10 DIAGNOSIS — Z7984 Long term (current) use of oral hypoglycemic drugs: Secondary | ICD-10-CM | POA: Diagnosis not present

## 2020-12-10 DIAGNOSIS — Z853 Personal history of malignant neoplasm of breast: Secondary | ICD-10-CM | POA: Diagnosis not present

## 2020-12-10 DIAGNOSIS — I5031 Acute diastolic (congestive) heart failure: Secondary | ICD-10-CM | POA: Diagnosis not present

## 2020-12-10 DIAGNOSIS — E876 Hypokalemia: Secondary | ICD-10-CM | POA: Diagnosis not present

## 2020-12-10 DIAGNOSIS — Z9012 Acquired absence of left breast and nipple: Secondary | ICD-10-CM | POA: Diagnosis not present

## 2020-12-10 DIAGNOSIS — E11649 Type 2 diabetes mellitus with hypoglycemia without coma: Secondary | ICD-10-CM | POA: Diagnosis not present

## 2020-12-10 DIAGNOSIS — Z794 Long term (current) use of insulin: Secondary | ICD-10-CM | POA: Diagnosis not present

## 2020-12-10 DIAGNOSIS — M1990 Unspecified osteoarthritis, unspecified site: Secondary | ICD-10-CM | POA: Diagnosis not present

## 2020-12-10 DIAGNOSIS — Z8673 Personal history of transient ischemic attack (TIA), and cerebral infarction without residual deficits: Secondary | ICD-10-CM | POA: Diagnosis not present

## 2020-12-10 DIAGNOSIS — Z9981 Dependence on supplemental oxygen: Secondary | ICD-10-CM | POA: Diagnosis not present

## 2020-12-10 DIAGNOSIS — J9601 Acute respiratory failure with hypoxia: Secondary | ICD-10-CM | POA: Diagnosis not present

## 2020-12-10 DIAGNOSIS — I110 Hypertensive heart disease with heart failure: Secondary | ICD-10-CM | POA: Diagnosis not present

## 2020-12-10 DIAGNOSIS — Z87891 Personal history of nicotine dependence: Secondary | ICD-10-CM | POA: Diagnosis not present

## 2020-12-10 DIAGNOSIS — I501 Left ventricular failure: Secondary | ICD-10-CM | POA: Diagnosis not present

## 2020-12-10 DIAGNOSIS — E782 Mixed hyperlipidemia: Secondary | ICD-10-CM | POA: Diagnosis not present

## 2020-12-14 DIAGNOSIS — Z7984 Long term (current) use of oral hypoglycemic drugs: Secondary | ICD-10-CM | POA: Diagnosis not present

## 2020-12-14 DIAGNOSIS — J9601 Acute respiratory failure with hypoxia: Secondary | ICD-10-CM | POA: Diagnosis not present

## 2020-12-14 DIAGNOSIS — I313 Pericardial effusion (noninflammatory): Secondary | ICD-10-CM | POA: Diagnosis not present

## 2020-12-14 DIAGNOSIS — Z9012 Acquired absence of left breast and nipple: Secondary | ICD-10-CM | POA: Diagnosis not present

## 2020-12-14 DIAGNOSIS — M1990 Unspecified osteoarthritis, unspecified site: Secondary | ICD-10-CM | POA: Diagnosis not present

## 2020-12-14 DIAGNOSIS — Z8673 Personal history of transient ischemic attack (TIA), and cerebral infarction without residual deficits: Secondary | ICD-10-CM | POA: Diagnosis not present

## 2020-12-14 DIAGNOSIS — I272 Pulmonary hypertension, unspecified: Secondary | ICD-10-CM | POA: Diagnosis not present

## 2020-12-14 DIAGNOSIS — E782 Mixed hyperlipidemia: Secondary | ICD-10-CM | POA: Diagnosis not present

## 2020-12-14 DIAGNOSIS — E876 Hypokalemia: Secondary | ICD-10-CM | POA: Diagnosis not present

## 2020-12-14 DIAGNOSIS — Z9981 Dependence on supplemental oxygen: Secondary | ICD-10-CM | POA: Diagnosis not present

## 2020-12-14 DIAGNOSIS — H5462 Unqualified visual loss, left eye, normal vision right eye: Secondary | ICD-10-CM | POA: Diagnosis not present

## 2020-12-14 DIAGNOSIS — I110 Hypertensive heart disease with heart failure: Secondary | ICD-10-CM | POA: Diagnosis not present

## 2020-12-14 DIAGNOSIS — I5031 Acute diastolic (congestive) heart failure: Secondary | ICD-10-CM | POA: Diagnosis not present

## 2020-12-14 DIAGNOSIS — Z87891 Personal history of nicotine dependence: Secondary | ICD-10-CM | POA: Diagnosis not present

## 2020-12-14 DIAGNOSIS — I501 Left ventricular failure: Secondary | ICD-10-CM | POA: Diagnosis not present

## 2020-12-14 DIAGNOSIS — Z794 Long term (current) use of insulin: Secondary | ICD-10-CM | POA: Diagnosis not present

## 2020-12-14 DIAGNOSIS — E11649 Type 2 diabetes mellitus with hypoglycemia without coma: Secondary | ICD-10-CM | POA: Diagnosis not present

## 2020-12-14 DIAGNOSIS — J4520 Mild intermittent asthma, uncomplicated: Secondary | ICD-10-CM | POA: Diagnosis not present

## 2020-12-14 DIAGNOSIS — Z853 Personal history of malignant neoplasm of breast: Secondary | ICD-10-CM | POA: Diagnosis not present

## 2020-12-16 DIAGNOSIS — Z9012 Acquired absence of left breast and nipple: Secondary | ICD-10-CM | POA: Diagnosis not present

## 2020-12-16 DIAGNOSIS — Z7984 Long term (current) use of oral hypoglycemic drugs: Secondary | ICD-10-CM | POA: Diagnosis not present

## 2020-12-16 DIAGNOSIS — I5031 Acute diastolic (congestive) heart failure: Secondary | ICD-10-CM | POA: Diagnosis not present

## 2020-12-16 DIAGNOSIS — I313 Pericardial effusion (noninflammatory): Secondary | ICD-10-CM | POA: Diagnosis not present

## 2020-12-16 DIAGNOSIS — M1990 Unspecified osteoarthritis, unspecified site: Secondary | ICD-10-CM | POA: Diagnosis not present

## 2020-12-16 DIAGNOSIS — Z794 Long term (current) use of insulin: Secondary | ICD-10-CM | POA: Diagnosis not present

## 2020-12-16 DIAGNOSIS — Z87891 Personal history of nicotine dependence: Secondary | ICD-10-CM | POA: Diagnosis not present

## 2020-12-16 DIAGNOSIS — I110 Hypertensive heart disease with heart failure: Secondary | ICD-10-CM | POA: Diagnosis not present

## 2020-12-16 DIAGNOSIS — Z853 Personal history of malignant neoplasm of breast: Secondary | ICD-10-CM | POA: Diagnosis not present

## 2020-12-16 DIAGNOSIS — I501 Left ventricular failure: Secondary | ICD-10-CM | POA: Diagnosis not present

## 2020-12-16 DIAGNOSIS — J4520 Mild intermittent asthma, uncomplicated: Secondary | ICD-10-CM | POA: Diagnosis not present

## 2020-12-16 DIAGNOSIS — J9601 Acute respiratory failure with hypoxia: Secondary | ICD-10-CM | POA: Diagnosis not present

## 2020-12-16 DIAGNOSIS — I272 Pulmonary hypertension, unspecified: Secondary | ICD-10-CM | POA: Diagnosis not present

## 2020-12-16 DIAGNOSIS — E782 Mixed hyperlipidemia: Secondary | ICD-10-CM | POA: Diagnosis not present

## 2020-12-16 DIAGNOSIS — Z9981 Dependence on supplemental oxygen: Secondary | ICD-10-CM | POA: Diagnosis not present

## 2020-12-16 DIAGNOSIS — E876 Hypokalemia: Secondary | ICD-10-CM | POA: Diagnosis not present

## 2020-12-16 DIAGNOSIS — H5462 Unqualified visual loss, left eye, normal vision right eye: Secondary | ICD-10-CM | POA: Diagnosis not present

## 2020-12-16 DIAGNOSIS — E11649 Type 2 diabetes mellitus with hypoglycemia without coma: Secondary | ICD-10-CM | POA: Diagnosis not present

## 2020-12-16 DIAGNOSIS — Z8673 Personal history of transient ischemic attack (TIA), and cerebral infarction without residual deficits: Secondary | ICD-10-CM | POA: Diagnosis not present

## 2020-12-17 DIAGNOSIS — J9601 Acute respiratory failure with hypoxia: Secondary | ICD-10-CM | POA: Diagnosis not present

## 2020-12-17 DIAGNOSIS — I5031 Acute diastolic (congestive) heart failure: Secondary | ICD-10-CM | POA: Diagnosis not present

## 2020-12-21 DIAGNOSIS — H5462 Unqualified visual loss, left eye, normal vision right eye: Secondary | ICD-10-CM | POA: Diagnosis not present

## 2020-12-21 DIAGNOSIS — E11649 Type 2 diabetes mellitus with hypoglycemia without coma: Secondary | ICD-10-CM | POA: Diagnosis not present

## 2020-12-21 DIAGNOSIS — I110 Hypertensive heart disease with heart failure: Secondary | ICD-10-CM | POA: Diagnosis not present

## 2020-12-21 DIAGNOSIS — Z87891 Personal history of nicotine dependence: Secondary | ICD-10-CM | POA: Diagnosis not present

## 2020-12-21 DIAGNOSIS — Z853 Personal history of malignant neoplasm of breast: Secondary | ICD-10-CM | POA: Diagnosis not present

## 2020-12-21 DIAGNOSIS — E782 Mixed hyperlipidemia: Secondary | ICD-10-CM | POA: Diagnosis not present

## 2020-12-21 DIAGNOSIS — M1990 Unspecified osteoarthritis, unspecified site: Secondary | ICD-10-CM | POA: Diagnosis not present

## 2020-12-21 DIAGNOSIS — I501 Left ventricular failure: Secondary | ICD-10-CM | POA: Diagnosis not present

## 2020-12-21 DIAGNOSIS — J4520 Mild intermittent asthma, uncomplicated: Secondary | ICD-10-CM | POA: Diagnosis not present

## 2020-12-21 DIAGNOSIS — J9601 Acute respiratory failure with hypoxia: Secondary | ICD-10-CM | POA: Diagnosis not present

## 2020-12-21 DIAGNOSIS — I313 Pericardial effusion (noninflammatory): Secondary | ICD-10-CM | POA: Diagnosis not present

## 2020-12-21 DIAGNOSIS — Z9012 Acquired absence of left breast and nipple: Secondary | ICD-10-CM | POA: Diagnosis not present

## 2020-12-21 DIAGNOSIS — E876 Hypokalemia: Secondary | ICD-10-CM | POA: Diagnosis not present

## 2020-12-21 DIAGNOSIS — I272 Pulmonary hypertension, unspecified: Secondary | ICD-10-CM | POA: Diagnosis not present

## 2020-12-21 DIAGNOSIS — Z8673 Personal history of transient ischemic attack (TIA), and cerebral infarction without residual deficits: Secondary | ICD-10-CM | POA: Diagnosis not present

## 2020-12-21 DIAGNOSIS — Z794 Long term (current) use of insulin: Secondary | ICD-10-CM | POA: Diagnosis not present

## 2020-12-21 DIAGNOSIS — Z7984 Long term (current) use of oral hypoglycemic drugs: Secondary | ICD-10-CM | POA: Diagnosis not present

## 2020-12-21 DIAGNOSIS — Z9981 Dependence on supplemental oxygen: Secondary | ICD-10-CM | POA: Diagnosis not present

## 2020-12-21 DIAGNOSIS — I5031 Acute diastolic (congestive) heart failure: Secondary | ICD-10-CM | POA: Diagnosis not present

## 2020-12-28 DIAGNOSIS — Z7984 Long term (current) use of oral hypoglycemic drugs: Secondary | ICD-10-CM | POA: Diagnosis not present

## 2020-12-28 DIAGNOSIS — E782 Mixed hyperlipidemia: Secondary | ICD-10-CM | POA: Diagnosis not present

## 2020-12-28 DIAGNOSIS — Z9012 Acquired absence of left breast and nipple: Secondary | ICD-10-CM | POA: Diagnosis not present

## 2020-12-28 DIAGNOSIS — I5031 Acute diastolic (congestive) heart failure: Secondary | ICD-10-CM | POA: Diagnosis not present

## 2020-12-28 DIAGNOSIS — Z794 Long term (current) use of insulin: Secondary | ICD-10-CM | POA: Diagnosis not present

## 2020-12-28 DIAGNOSIS — I272 Pulmonary hypertension, unspecified: Secondary | ICD-10-CM | POA: Diagnosis not present

## 2020-12-28 DIAGNOSIS — J4520 Mild intermittent asthma, uncomplicated: Secondary | ICD-10-CM | POA: Diagnosis not present

## 2020-12-28 DIAGNOSIS — Z87891 Personal history of nicotine dependence: Secondary | ICD-10-CM | POA: Diagnosis not present

## 2020-12-28 DIAGNOSIS — J9601 Acute respiratory failure with hypoxia: Secondary | ICD-10-CM | POA: Diagnosis not present

## 2020-12-28 DIAGNOSIS — I313 Pericardial effusion (noninflammatory): Secondary | ICD-10-CM | POA: Diagnosis not present

## 2020-12-28 DIAGNOSIS — Z9981 Dependence on supplemental oxygen: Secondary | ICD-10-CM | POA: Diagnosis not present

## 2020-12-28 DIAGNOSIS — I110 Hypertensive heart disease with heart failure: Secondary | ICD-10-CM | POA: Diagnosis not present

## 2020-12-28 DIAGNOSIS — H5462 Unqualified visual loss, left eye, normal vision right eye: Secondary | ICD-10-CM | POA: Diagnosis not present

## 2020-12-28 DIAGNOSIS — E876 Hypokalemia: Secondary | ICD-10-CM | POA: Diagnosis not present

## 2020-12-28 DIAGNOSIS — Z8673 Personal history of transient ischemic attack (TIA), and cerebral infarction without residual deficits: Secondary | ICD-10-CM | POA: Diagnosis not present

## 2020-12-28 DIAGNOSIS — E11649 Type 2 diabetes mellitus with hypoglycemia without coma: Secondary | ICD-10-CM | POA: Diagnosis not present

## 2020-12-28 DIAGNOSIS — Z853 Personal history of malignant neoplasm of breast: Secondary | ICD-10-CM | POA: Diagnosis not present

## 2020-12-28 DIAGNOSIS — M1990 Unspecified osteoarthritis, unspecified site: Secondary | ICD-10-CM | POA: Diagnosis not present

## 2020-12-28 DIAGNOSIS — I501 Left ventricular failure: Secondary | ICD-10-CM | POA: Diagnosis not present

## 2020-12-29 DIAGNOSIS — Z87891 Personal history of nicotine dependence: Secondary | ICD-10-CM | POA: Diagnosis not present

## 2020-12-29 DIAGNOSIS — Z853 Personal history of malignant neoplasm of breast: Secondary | ICD-10-CM | POA: Diagnosis not present

## 2020-12-29 DIAGNOSIS — E876 Hypokalemia: Secondary | ICD-10-CM | POA: Diagnosis not present

## 2020-12-29 DIAGNOSIS — I313 Pericardial effusion (noninflammatory): Secondary | ICD-10-CM | POA: Diagnosis not present

## 2020-12-29 DIAGNOSIS — E782 Mixed hyperlipidemia: Secondary | ICD-10-CM | POA: Diagnosis not present

## 2020-12-29 DIAGNOSIS — H5462 Unqualified visual loss, left eye, normal vision right eye: Secondary | ICD-10-CM | POA: Diagnosis not present

## 2020-12-29 DIAGNOSIS — Z9012 Acquired absence of left breast and nipple: Secondary | ICD-10-CM | POA: Diagnosis not present

## 2020-12-29 DIAGNOSIS — Z9981 Dependence on supplemental oxygen: Secondary | ICD-10-CM | POA: Diagnosis not present

## 2020-12-29 DIAGNOSIS — Z7984 Long term (current) use of oral hypoglycemic drugs: Secondary | ICD-10-CM | POA: Diagnosis not present

## 2020-12-29 DIAGNOSIS — I272 Pulmonary hypertension, unspecified: Secondary | ICD-10-CM | POA: Diagnosis not present

## 2020-12-29 DIAGNOSIS — Z794 Long term (current) use of insulin: Secondary | ICD-10-CM | POA: Diagnosis not present

## 2020-12-29 DIAGNOSIS — J9601 Acute respiratory failure with hypoxia: Secondary | ICD-10-CM | POA: Diagnosis not present

## 2020-12-29 DIAGNOSIS — Z8673 Personal history of transient ischemic attack (TIA), and cerebral infarction without residual deficits: Secondary | ICD-10-CM | POA: Diagnosis not present

## 2020-12-29 DIAGNOSIS — M1990 Unspecified osteoarthritis, unspecified site: Secondary | ICD-10-CM | POA: Diagnosis not present

## 2020-12-29 DIAGNOSIS — J4520 Mild intermittent asthma, uncomplicated: Secondary | ICD-10-CM | POA: Diagnosis not present

## 2020-12-29 DIAGNOSIS — E11649 Type 2 diabetes mellitus with hypoglycemia without coma: Secondary | ICD-10-CM | POA: Diagnosis not present

## 2020-12-29 DIAGNOSIS — I501 Left ventricular failure: Secondary | ICD-10-CM | POA: Diagnosis not present

## 2020-12-29 DIAGNOSIS — I5031 Acute diastolic (congestive) heart failure: Secondary | ICD-10-CM | POA: Diagnosis not present

## 2020-12-29 DIAGNOSIS — I110 Hypertensive heart disease with heart failure: Secondary | ICD-10-CM | POA: Diagnosis not present

## 2020-12-30 DIAGNOSIS — I10 Essential (primary) hypertension: Secondary | ICD-10-CM | POA: Diagnosis not present

## 2020-12-30 DIAGNOSIS — I5033 Acute on chronic diastolic (congestive) heart failure: Secondary | ICD-10-CM | POA: Diagnosis not present

## 2020-12-30 DIAGNOSIS — E118 Type 2 diabetes mellitus with unspecified complications: Secondary | ICD-10-CM | POA: Diagnosis not present

## 2020-12-30 DIAGNOSIS — Z794 Long term (current) use of insulin: Secondary | ICD-10-CM | POA: Diagnosis not present

## 2021-01-05 DIAGNOSIS — Z9981 Dependence on supplemental oxygen: Secondary | ICD-10-CM | POA: Diagnosis not present

## 2021-01-05 DIAGNOSIS — Z7984 Long term (current) use of oral hypoglycemic drugs: Secondary | ICD-10-CM | POA: Diagnosis not present

## 2021-01-05 DIAGNOSIS — I272 Pulmonary hypertension, unspecified: Secondary | ICD-10-CM | POA: Diagnosis not present

## 2021-01-05 DIAGNOSIS — Z8673 Personal history of transient ischemic attack (TIA), and cerebral infarction without residual deficits: Secondary | ICD-10-CM | POA: Diagnosis not present

## 2021-01-05 DIAGNOSIS — Z794 Long term (current) use of insulin: Secondary | ICD-10-CM | POA: Diagnosis not present

## 2021-01-05 DIAGNOSIS — Z9012 Acquired absence of left breast and nipple: Secondary | ICD-10-CM | POA: Diagnosis not present

## 2021-01-05 DIAGNOSIS — M1990 Unspecified osteoarthritis, unspecified site: Secondary | ICD-10-CM | POA: Diagnosis not present

## 2021-01-05 DIAGNOSIS — J9601 Acute respiratory failure with hypoxia: Secondary | ICD-10-CM | POA: Diagnosis not present

## 2021-01-05 DIAGNOSIS — E11649 Type 2 diabetes mellitus with hypoglycemia without coma: Secondary | ICD-10-CM | POA: Diagnosis not present

## 2021-01-05 DIAGNOSIS — I110 Hypertensive heart disease with heart failure: Secondary | ICD-10-CM | POA: Diagnosis not present

## 2021-01-05 DIAGNOSIS — Z87891 Personal history of nicotine dependence: Secondary | ICD-10-CM | POA: Diagnosis not present

## 2021-01-05 DIAGNOSIS — E782 Mixed hyperlipidemia: Secondary | ICD-10-CM | POA: Diagnosis not present

## 2021-01-05 DIAGNOSIS — J4520 Mild intermittent asthma, uncomplicated: Secondary | ICD-10-CM | POA: Diagnosis not present

## 2021-01-05 DIAGNOSIS — H5462 Unqualified visual loss, left eye, normal vision right eye: Secondary | ICD-10-CM | POA: Diagnosis not present

## 2021-01-05 DIAGNOSIS — I5031 Acute diastolic (congestive) heart failure: Secondary | ICD-10-CM | POA: Diagnosis not present

## 2021-01-05 DIAGNOSIS — I313 Pericardial effusion (noninflammatory): Secondary | ICD-10-CM | POA: Diagnosis not present

## 2021-01-05 DIAGNOSIS — I501 Left ventricular failure: Secondary | ICD-10-CM | POA: Diagnosis not present

## 2021-01-05 DIAGNOSIS — Z853 Personal history of malignant neoplasm of breast: Secondary | ICD-10-CM | POA: Diagnosis not present

## 2021-01-05 DIAGNOSIS — E876 Hypokalemia: Secondary | ICD-10-CM | POA: Diagnosis not present

## 2021-01-06 DIAGNOSIS — J4520 Mild intermittent asthma, uncomplicated: Secondary | ICD-10-CM | POA: Diagnosis not present

## 2021-01-06 DIAGNOSIS — Z8673 Personal history of transient ischemic attack (TIA), and cerebral infarction without residual deficits: Secondary | ICD-10-CM | POA: Diagnosis not present

## 2021-01-06 DIAGNOSIS — Z9981 Dependence on supplemental oxygen: Secondary | ICD-10-CM | POA: Diagnosis not present

## 2021-01-06 DIAGNOSIS — E782 Mixed hyperlipidemia: Secondary | ICD-10-CM | POA: Diagnosis not present

## 2021-01-06 DIAGNOSIS — Z794 Long term (current) use of insulin: Secondary | ICD-10-CM | POA: Diagnosis not present

## 2021-01-06 DIAGNOSIS — Z87891 Personal history of nicotine dependence: Secondary | ICD-10-CM | POA: Diagnosis not present

## 2021-01-06 DIAGNOSIS — J9601 Acute respiratory failure with hypoxia: Secondary | ICD-10-CM | POA: Diagnosis not present

## 2021-01-06 DIAGNOSIS — I313 Pericardial effusion (noninflammatory): Secondary | ICD-10-CM | POA: Diagnosis not present

## 2021-01-06 DIAGNOSIS — I501 Left ventricular failure: Secondary | ICD-10-CM | POA: Diagnosis not present

## 2021-01-06 DIAGNOSIS — M1990 Unspecified osteoarthritis, unspecified site: Secondary | ICD-10-CM | POA: Diagnosis not present

## 2021-01-06 DIAGNOSIS — Z7984 Long term (current) use of oral hypoglycemic drugs: Secondary | ICD-10-CM | POA: Diagnosis not present

## 2021-01-06 DIAGNOSIS — H5462 Unqualified visual loss, left eye, normal vision right eye: Secondary | ICD-10-CM | POA: Diagnosis not present

## 2021-01-06 DIAGNOSIS — I5031 Acute diastolic (congestive) heart failure: Secondary | ICD-10-CM | POA: Diagnosis not present

## 2021-01-06 DIAGNOSIS — I110 Hypertensive heart disease with heart failure: Secondary | ICD-10-CM | POA: Diagnosis not present

## 2021-01-06 DIAGNOSIS — E11649 Type 2 diabetes mellitus with hypoglycemia without coma: Secondary | ICD-10-CM | POA: Diagnosis not present

## 2021-01-06 DIAGNOSIS — Z853 Personal history of malignant neoplasm of breast: Secondary | ICD-10-CM | POA: Diagnosis not present

## 2021-01-06 DIAGNOSIS — Z9012 Acquired absence of left breast and nipple: Secondary | ICD-10-CM | POA: Diagnosis not present

## 2021-01-06 DIAGNOSIS — I272 Pulmonary hypertension, unspecified: Secondary | ICD-10-CM | POA: Diagnosis not present

## 2021-01-06 DIAGNOSIS — E876 Hypokalemia: Secondary | ICD-10-CM | POA: Diagnosis not present

## 2021-01-13 DIAGNOSIS — I5031 Acute diastolic (congestive) heart failure: Secondary | ICD-10-CM | POA: Diagnosis not present

## 2021-01-13 DIAGNOSIS — Z87891 Personal history of nicotine dependence: Secondary | ICD-10-CM | POA: Diagnosis not present

## 2021-01-13 DIAGNOSIS — H5462 Unqualified visual loss, left eye, normal vision right eye: Secondary | ICD-10-CM | POA: Diagnosis not present

## 2021-01-13 DIAGNOSIS — Z9981 Dependence on supplemental oxygen: Secondary | ICD-10-CM | POA: Diagnosis not present

## 2021-01-13 DIAGNOSIS — I313 Pericardial effusion (noninflammatory): Secondary | ICD-10-CM | POA: Diagnosis not present

## 2021-01-13 DIAGNOSIS — Z794 Long term (current) use of insulin: Secondary | ICD-10-CM | POA: Diagnosis not present

## 2021-01-13 DIAGNOSIS — I272 Pulmonary hypertension, unspecified: Secondary | ICD-10-CM | POA: Diagnosis not present

## 2021-01-13 DIAGNOSIS — M1990 Unspecified osteoarthritis, unspecified site: Secondary | ICD-10-CM | POA: Diagnosis not present

## 2021-01-13 DIAGNOSIS — Z9012 Acquired absence of left breast and nipple: Secondary | ICD-10-CM | POA: Diagnosis not present

## 2021-01-13 DIAGNOSIS — I501 Left ventricular failure: Secondary | ICD-10-CM | POA: Diagnosis not present

## 2021-01-13 DIAGNOSIS — J9601 Acute respiratory failure with hypoxia: Secondary | ICD-10-CM | POA: Diagnosis not present

## 2021-01-13 DIAGNOSIS — I110 Hypertensive heart disease with heart failure: Secondary | ICD-10-CM | POA: Diagnosis not present

## 2021-01-13 DIAGNOSIS — Z853 Personal history of malignant neoplasm of breast: Secondary | ICD-10-CM | POA: Diagnosis not present

## 2021-01-13 DIAGNOSIS — Z8673 Personal history of transient ischemic attack (TIA), and cerebral infarction without residual deficits: Secondary | ICD-10-CM | POA: Diagnosis not present

## 2021-01-13 DIAGNOSIS — E876 Hypokalemia: Secondary | ICD-10-CM | POA: Diagnosis not present

## 2021-01-13 DIAGNOSIS — Z7984 Long term (current) use of oral hypoglycemic drugs: Secondary | ICD-10-CM | POA: Diagnosis not present

## 2021-01-13 DIAGNOSIS — J4520 Mild intermittent asthma, uncomplicated: Secondary | ICD-10-CM | POA: Diagnosis not present

## 2021-01-13 DIAGNOSIS — E782 Mixed hyperlipidemia: Secondary | ICD-10-CM | POA: Diagnosis not present

## 2021-01-13 DIAGNOSIS — E11649 Type 2 diabetes mellitus with hypoglycemia without coma: Secondary | ICD-10-CM | POA: Diagnosis not present

## 2021-01-17 DIAGNOSIS — I272 Pulmonary hypertension, unspecified: Secondary | ICD-10-CM | POA: Diagnosis not present

## 2021-01-17 DIAGNOSIS — Z9981 Dependence on supplemental oxygen: Secondary | ICD-10-CM | POA: Diagnosis not present

## 2021-01-17 DIAGNOSIS — I5031 Acute diastolic (congestive) heart failure: Secondary | ICD-10-CM | POA: Diagnosis not present

## 2021-01-17 DIAGNOSIS — M1990 Unspecified osteoarthritis, unspecified site: Secondary | ICD-10-CM | POA: Diagnosis not present

## 2021-01-17 DIAGNOSIS — I313 Pericardial effusion (noninflammatory): Secondary | ICD-10-CM | POA: Diagnosis not present

## 2021-01-17 DIAGNOSIS — E876 Hypokalemia: Secondary | ICD-10-CM | POA: Diagnosis not present

## 2021-01-17 DIAGNOSIS — Z9012 Acquired absence of left breast and nipple: Secondary | ICD-10-CM | POA: Diagnosis not present

## 2021-01-17 DIAGNOSIS — H5462 Unqualified visual loss, left eye, normal vision right eye: Secondary | ICD-10-CM | POA: Diagnosis not present

## 2021-01-17 DIAGNOSIS — Z853 Personal history of malignant neoplasm of breast: Secondary | ICD-10-CM | POA: Diagnosis not present

## 2021-01-17 DIAGNOSIS — J9601 Acute respiratory failure with hypoxia: Secondary | ICD-10-CM | POA: Diagnosis not present

## 2021-01-17 DIAGNOSIS — Z794 Long term (current) use of insulin: Secondary | ICD-10-CM | POA: Diagnosis not present

## 2021-01-17 DIAGNOSIS — I501 Left ventricular failure: Secondary | ICD-10-CM | POA: Diagnosis not present

## 2021-01-17 DIAGNOSIS — Z87891 Personal history of nicotine dependence: Secondary | ICD-10-CM | POA: Diagnosis not present

## 2021-01-17 DIAGNOSIS — E782 Mixed hyperlipidemia: Secondary | ICD-10-CM | POA: Diagnosis not present

## 2021-01-17 DIAGNOSIS — Z7984 Long term (current) use of oral hypoglycemic drugs: Secondary | ICD-10-CM | POA: Diagnosis not present

## 2021-01-17 DIAGNOSIS — J4520 Mild intermittent asthma, uncomplicated: Secondary | ICD-10-CM | POA: Diagnosis not present

## 2021-01-17 DIAGNOSIS — E11649 Type 2 diabetes mellitus with hypoglycemia without coma: Secondary | ICD-10-CM | POA: Diagnosis not present

## 2021-01-17 DIAGNOSIS — Z8673 Personal history of transient ischemic attack (TIA), and cerebral infarction without residual deficits: Secondary | ICD-10-CM | POA: Diagnosis not present

## 2021-01-17 DIAGNOSIS — I110 Hypertensive heart disease with heart failure: Secondary | ICD-10-CM | POA: Diagnosis not present

## 2021-01-19 DIAGNOSIS — Z853 Personal history of malignant neoplasm of breast: Secondary | ICD-10-CM | POA: Diagnosis not present

## 2021-01-19 DIAGNOSIS — I272 Pulmonary hypertension, unspecified: Secondary | ICD-10-CM | POA: Diagnosis not present

## 2021-01-19 DIAGNOSIS — Z7984 Long term (current) use of oral hypoglycemic drugs: Secondary | ICD-10-CM | POA: Diagnosis not present

## 2021-01-19 DIAGNOSIS — J4520 Mild intermittent asthma, uncomplicated: Secondary | ICD-10-CM | POA: Diagnosis not present

## 2021-01-19 DIAGNOSIS — Z9012 Acquired absence of left breast and nipple: Secondary | ICD-10-CM | POA: Diagnosis not present

## 2021-01-19 DIAGNOSIS — H5462 Unqualified visual loss, left eye, normal vision right eye: Secondary | ICD-10-CM | POA: Diagnosis not present

## 2021-01-19 DIAGNOSIS — E782 Mixed hyperlipidemia: Secondary | ICD-10-CM | POA: Diagnosis not present

## 2021-01-19 DIAGNOSIS — I5031 Acute diastolic (congestive) heart failure: Secondary | ICD-10-CM | POA: Diagnosis not present

## 2021-01-19 DIAGNOSIS — I501 Left ventricular failure: Secondary | ICD-10-CM | POA: Diagnosis not present

## 2021-01-19 DIAGNOSIS — Z8673 Personal history of transient ischemic attack (TIA), and cerebral infarction without residual deficits: Secondary | ICD-10-CM | POA: Diagnosis not present

## 2021-01-19 DIAGNOSIS — Z9981 Dependence on supplemental oxygen: Secondary | ICD-10-CM | POA: Diagnosis not present

## 2021-01-19 DIAGNOSIS — Z87891 Personal history of nicotine dependence: Secondary | ICD-10-CM | POA: Diagnosis not present

## 2021-01-19 DIAGNOSIS — E876 Hypokalemia: Secondary | ICD-10-CM | POA: Diagnosis not present

## 2021-01-19 DIAGNOSIS — I110 Hypertensive heart disease with heart failure: Secondary | ICD-10-CM | POA: Diagnosis not present

## 2021-01-19 DIAGNOSIS — E11649 Type 2 diabetes mellitus with hypoglycemia without coma: Secondary | ICD-10-CM | POA: Diagnosis not present

## 2021-01-19 DIAGNOSIS — I313 Pericardial effusion (noninflammatory): Secondary | ICD-10-CM | POA: Diagnosis not present

## 2021-01-19 DIAGNOSIS — M1990 Unspecified osteoarthritis, unspecified site: Secondary | ICD-10-CM | POA: Diagnosis not present

## 2021-01-19 DIAGNOSIS — Z794 Long term (current) use of insulin: Secondary | ICD-10-CM | POA: Diagnosis not present

## 2021-01-19 DIAGNOSIS — J9601 Acute respiratory failure with hypoxia: Secondary | ICD-10-CM | POA: Diagnosis not present

## 2021-01-20 DIAGNOSIS — N1831 Chronic kidney disease, stage 3a: Secondary | ICD-10-CM | POA: Diagnosis not present

## 2021-01-20 DIAGNOSIS — I129 Hypertensive chronic kidney disease with stage 1 through stage 4 chronic kidney disease, or unspecified chronic kidney disease: Secondary | ICD-10-CM | POA: Diagnosis not present

## 2021-01-20 DIAGNOSIS — N184 Chronic kidney disease, stage 4 (severe): Secondary | ICD-10-CM | POA: Insufficient documentation

## 2021-01-20 DIAGNOSIS — Z794 Long term (current) use of insulin: Secondary | ICD-10-CM | POA: Diagnosis not present

## 2021-01-20 DIAGNOSIS — E875 Hyperkalemia: Secondary | ICD-10-CM | POA: Insufficient documentation

## 2021-01-20 DIAGNOSIS — Z87442 Personal history of urinary calculi: Secondary | ICD-10-CM | POA: Diagnosis not present

## 2021-01-20 DIAGNOSIS — Z91040 Latex allergy status: Secondary | ICD-10-CM | POA: Diagnosis not present

## 2021-01-20 DIAGNOSIS — R079 Chest pain, unspecified: Secondary | ICD-10-CM | POA: Diagnosis not present

## 2021-01-20 DIAGNOSIS — Z7984 Long term (current) use of oral hypoglycemic drugs: Secondary | ICD-10-CM | POA: Diagnosis not present

## 2021-01-20 DIAGNOSIS — Z79899 Other long term (current) drug therapy: Secondary | ICD-10-CM | POA: Diagnosis not present

## 2021-01-20 DIAGNOSIS — N179 Acute kidney failure, unspecified: Secondary | ICD-10-CM | POA: Diagnosis not present

## 2021-01-20 DIAGNOSIS — Z20822 Contact with and (suspected) exposure to covid-19: Secondary | ICD-10-CM | POA: Diagnosis not present

## 2021-01-20 DIAGNOSIS — R5381 Other malaise: Secondary | ICD-10-CM | POA: Diagnosis not present

## 2021-01-20 DIAGNOSIS — J4530 Mild persistent asthma, uncomplicated: Secondary | ICD-10-CM | POA: Diagnosis not present

## 2021-01-20 DIAGNOSIS — J45909 Unspecified asthma, uncomplicated: Secondary | ICD-10-CM | POA: Diagnosis not present

## 2021-01-20 DIAGNOSIS — I10 Essential (primary) hypertension: Secondary | ICD-10-CM | POA: Diagnosis not present

## 2021-01-20 DIAGNOSIS — Z881 Allergy status to other antibiotic agents status: Secondary | ICD-10-CM | POA: Diagnosis not present

## 2021-01-20 DIAGNOSIS — Z87891 Personal history of nicotine dependence: Secondary | ICD-10-CM | POA: Diagnosis not present

## 2021-01-20 DIAGNOSIS — R9082 White matter disease, unspecified: Secondary | ICD-10-CM | POA: Diagnosis not present

## 2021-01-20 DIAGNOSIS — R410 Disorientation, unspecified: Secondary | ICD-10-CM | POA: Diagnosis not present

## 2021-01-20 DIAGNOSIS — E11649 Type 2 diabetes mellitus with hypoglycemia without coma: Secondary | ICD-10-CM | POA: Diagnosis not present

## 2021-01-20 DIAGNOSIS — E1122 Type 2 diabetes mellitus with diabetic chronic kidney disease: Secondary | ICD-10-CM | POA: Diagnosis not present

## 2021-01-20 DIAGNOSIS — Z8673 Personal history of transient ischemic attack (TIA), and cerebral infarction without residual deficits: Secondary | ICD-10-CM | POA: Diagnosis not present

## 2021-01-20 DIAGNOSIS — Z7982 Long term (current) use of aspirin: Secondary | ICD-10-CM | POA: Diagnosis not present

## 2021-01-20 DIAGNOSIS — R918 Other nonspecific abnormal finding of lung field: Secondary | ICD-10-CM | POA: Diagnosis not present

## 2021-01-20 DIAGNOSIS — I6782 Cerebral ischemia: Secondary | ICD-10-CM | POA: Diagnosis not present

## 2021-01-21 DIAGNOSIS — E11649 Type 2 diabetes mellitus with hypoglycemia without coma: Secondary | ICD-10-CM | POA: Insufficient documentation

## 2021-01-21 DIAGNOSIS — R5381 Other malaise: Secondary | ICD-10-CM | POA: Insufficient documentation

## 2021-01-26 DIAGNOSIS — Z09 Encounter for follow-up examination after completed treatment for conditions other than malignant neoplasm: Secondary | ICD-10-CM | POA: Diagnosis not present

## 2021-01-26 DIAGNOSIS — E118 Type 2 diabetes mellitus with unspecified complications: Secondary | ICD-10-CM | POA: Diagnosis not present

## 2021-01-26 DIAGNOSIS — I10 Essential (primary) hypertension: Secondary | ICD-10-CM | POA: Diagnosis not present

## 2021-01-26 DIAGNOSIS — N184 Chronic kidney disease, stage 4 (severe): Secondary | ICD-10-CM | POA: Diagnosis not present

## 2021-02-01 DIAGNOSIS — Z7951 Long term (current) use of inhaled steroids: Secondary | ICD-10-CM | POA: Diagnosis not present

## 2021-02-01 DIAGNOSIS — Z9981 Dependence on supplemental oxygen: Secondary | ICD-10-CM | POA: Diagnosis not present

## 2021-02-01 DIAGNOSIS — J4520 Mild intermittent asthma, uncomplicated: Secondary | ICD-10-CM | POA: Diagnosis not present

## 2021-02-01 DIAGNOSIS — N184 Chronic kidney disease, stage 4 (severe): Secondary | ICD-10-CM | POA: Diagnosis not present

## 2021-02-01 DIAGNOSIS — E11649 Type 2 diabetes mellitus with hypoglycemia without coma: Secondary | ICD-10-CM | POA: Diagnosis not present

## 2021-02-01 DIAGNOSIS — I130 Hypertensive heart and chronic kidney disease with heart failure and stage 1 through stage 4 chronic kidney disease, or unspecified chronic kidney disease: Secondary | ICD-10-CM | POA: Diagnosis not present

## 2021-02-01 DIAGNOSIS — Z87891 Personal history of nicotine dependence: Secondary | ICD-10-CM | POA: Diagnosis not present

## 2021-02-01 DIAGNOSIS — E1122 Type 2 diabetes mellitus with diabetic chronic kidney disease: Secondary | ICD-10-CM | POA: Diagnosis not present

## 2021-02-01 DIAGNOSIS — Z9181 History of falling: Secondary | ICD-10-CM | POA: Diagnosis not present

## 2021-02-01 DIAGNOSIS — Z8673 Personal history of transient ischemic attack (TIA), and cerebral infarction without residual deficits: Secondary | ICD-10-CM | POA: Diagnosis not present

## 2021-02-01 DIAGNOSIS — M1990 Unspecified osteoarthritis, unspecified site: Secondary | ICD-10-CM | POA: Diagnosis not present

## 2021-02-01 DIAGNOSIS — Z794 Long term (current) use of insulin: Secondary | ICD-10-CM | POA: Diagnosis not present

## 2021-02-01 DIAGNOSIS — E1169 Type 2 diabetes mellitus with other specified complication: Secondary | ICD-10-CM | POA: Diagnosis not present

## 2021-02-01 DIAGNOSIS — I5031 Acute diastolic (congestive) heart failure: Secondary | ICD-10-CM | POA: Diagnosis not present

## 2021-02-01 DIAGNOSIS — J9601 Acute respiratory failure with hypoxia: Secondary | ICD-10-CM | POA: Diagnosis not present

## 2021-02-01 DIAGNOSIS — E875 Hyperkalemia: Secondary | ICD-10-CM | POA: Diagnosis not present

## 2021-02-01 DIAGNOSIS — E782 Mixed hyperlipidemia: Secondary | ICD-10-CM | POA: Diagnosis not present

## 2021-02-01 DIAGNOSIS — Z7982 Long term (current) use of aspirin: Secondary | ICD-10-CM | POA: Diagnosis not present

## 2021-02-01 DIAGNOSIS — Z7984 Long term (current) use of oral hypoglycemic drugs: Secondary | ICD-10-CM | POA: Diagnosis not present

## 2021-02-01 DIAGNOSIS — Z853 Personal history of malignant neoplasm of breast: Secondary | ICD-10-CM | POA: Diagnosis not present

## 2021-02-01 DIAGNOSIS — N179 Acute kidney failure, unspecified: Secondary | ICD-10-CM | POA: Diagnosis not present

## 2021-02-07 DIAGNOSIS — J4520 Mild intermittent asthma, uncomplicated: Secondary | ICD-10-CM | POA: Diagnosis not present

## 2021-02-07 DIAGNOSIS — J9601 Acute respiratory failure with hypoxia: Secondary | ICD-10-CM | POA: Diagnosis not present

## 2021-02-07 DIAGNOSIS — N184 Chronic kidney disease, stage 4 (severe): Secondary | ICD-10-CM | POA: Diagnosis not present

## 2021-02-07 DIAGNOSIS — E875 Hyperkalemia: Secondary | ICD-10-CM | POA: Diagnosis not present

## 2021-02-07 DIAGNOSIS — Z7951 Long term (current) use of inhaled steroids: Secondary | ICD-10-CM | POA: Diagnosis not present

## 2021-02-07 DIAGNOSIS — E1122 Type 2 diabetes mellitus with diabetic chronic kidney disease: Secondary | ICD-10-CM | POA: Diagnosis not present

## 2021-02-07 DIAGNOSIS — I130 Hypertensive heart and chronic kidney disease with heart failure and stage 1 through stage 4 chronic kidney disease, or unspecified chronic kidney disease: Secondary | ICD-10-CM | POA: Diagnosis not present

## 2021-02-07 DIAGNOSIS — Z9181 History of falling: Secondary | ICD-10-CM | POA: Diagnosis not present

## 2021-02-07 DIAGNOSIS — Z7982 Long term (current) use of aspirin: Secondary | ICD-10-CM | POA: Diagnosis not present

## 2021-02-07 DIAGNOSIS — M1990 Unspecified osteoarthritis, unspecified site: Secondary | ICD-10-CM | POA: Diagnosis not present

## 2021-02-07 DIAGNOSIS — Z853 Personal history of malignant neoplasm of breast: Secondary | ICD-10-CM | POA: Diagnosis not present

## 2021-02-07 DIAGNOSIS — Z87891 Personal history of nicotine dependence: Secondary | ICD-10-CM | POA: Diagnosis not present

## 2021-02-07 DIAGNOSIS — Z7984 Long term (current) use of oral hypoglycemic drugs: Secondary | ICD-10-CM | POA: Diagnosis not present

## 2021-02-07 DIAGNOSIS — E1169 Type 2 diabetes mellitus with other specified complication: Secondary | ICD-10-CM | POA: Diagnosis not present

## 2021-02-07 DIAGNOSIS — E782 Mixed hyperlipidemia: Secondary | ICD-10-CM | POA: Diagnosis not present

## 2021-02-07 DIAGNOSIS — Z8673 Personal history of transient ischemic attack (TIA), and cerebral infarction without residual deficits: Secondary | ICD-10-CM | POA: Diagnosis not present

## 2021-02-07 DIAGNOSIS — E11649 Type 2 diabetes mellitus with hypoglycemia without coma: Secondary | ICD-10-CM | POA: Diagnosis not present

## 2021-02-07 DIAGNOSIS — I5031 Acute diastolic (congestive) heart failure: Secondary | ICD-10-CM | POA: Diagnosis not present

## 2021-02-07 DIAGNOSIS — N179 Acute kidney failure, unspecified: Secondary | ICD-10-CM | POA: Diagnosis not present

## 2021-02-07 DIAGNOSIS — Z9981 Dependence on supplemental oxygen: Secondary | ICD-10-CM | POA: Diagnosis not present

## 2021-02-07 DIAGNOSIS — Z794 Long term (current) use of insulin: Secondary | ICD-10-CM | POA: Diagnosis not present

## 2021-02-10 DIAGNOSIS — E11649 Type 2 diabetes mellitus with hypoglycemia without coma: Secondary | ICD-10-CM | POA: Diagnosis not present

## 2021-02-10 DIAGNOSIS — I5031 Acute diastolic (congestive) heart failure: Secondary | ICD-10-CM | POA: Diagnosis not present

## 2021-02-10 DIAGNOSIS — Z7982 Long term (current) use of aspirin: Secondary | ICD-10-CM | POA: Diagnosis not present

## 2021-02-10 DIAGNOSIS — N184 Chronic kidney disease, stage 4 (severe): Secondary | ICD-10-CM | POA: Diagnosis not present

## 2021-02-10 DIAGNOSIS — J4520 Mild intermittent asthma, uncomplicated: Secondary | ICD-10-CM | POA: Diagnosis not present

## 2021-02-10 DIAGNOSIS — I130 Hypertensive heart and chronic kidney disease with heart failure and stage 1 through stage 4 chronic kidney disease, or unspecified chronic kidney disease: Secondary | ICD-10-CM | POA: Diagnosis not present

## 2021-02-10 DIAGNOSIS — Z8673 Personal history of transient ischemic attack (TIA), and cerebral infarction without residual deficits: Secondary | ICD-10-CM | POA: Diagnosis not present

## 2021-02-10 DIAGNOSIS — Z7984 Long term (current) use of oral hypoglycemic drugs: Secondary | ICD-10-CM | POA: Diagnosis not present

## 2021-02-10 DIAGNOSIS — E1122 Type 2 diabetes mellitus with diabetic chronic kidney disease: Secondary | ICD-10-CM | POA: Diagnosis not present

## 2021-02-10 DIAGNOSIS — Z87891 Personal history of nicotine dependence: Secondary | ICD-10-CM | POA: Diagnosis not present

## 2021-02-10 DIAGNOSIS — Z7951 Long term (current) use of inhaled steroids: Secondary | ICD-10-CM | POA: Diagnosis not present

## 2021-02-10 DIAGNOSIS — N179 Acute kidney failure, unspecified: Secondary | ICD-10-CM | POA: Diagnosis not present

## 2021-02-10 DIAGNOSIS — E875 Hyperkalemia: Secondary | ICD-10-CM | POA: Diagnosis not present

## 2021-02-10 DIAGNOSIS — M1990 Unspecified osteoarthritis, unspecified site: Secondary | ICD-10-CM | POA: Diagnosis not present

## 2021-02-10 DIAGNOSIS — J9601 Acute respiratory failure with hypoxia: Secondary | ICD-10-CM | POA: Diagnosis not present

## 2021-02-10 DIAGNOSIS — Z9981 Dependence on supplemental oxygen: Secondary | ICD-10-CM | POA: Diagnosis not present

## 2021-02-10 DIAGNOSIS — E1169 Type 2 diabetes mellitus with other specified complication: Secondary | ICD-10-CM | POA: Diagnosis not present

## 2021-02-10 DIAGNOSIS — Z9181 History of falling: Secondary | ICD-10-CM | POA: Diagnosis not present

## 2021-02-10 DIAGNOSIS — E782 Mixed hyperlipidemia: Secondary | ICD-10-CM | POA: Diagnosis not present

## 2021-02-10 DIAGNOSIS — Z794 Long term (current) use of insulin: Secondary | ICD-10-CM | POA: Diagnosis not present

## 2021-02-10 DIAGNOSIS — Z853 Personal history of malignant neoplasm of breast: Secondary | ICD-10-CM | POA: Diagnosis not present

## 2021-02-17 DIAGNOSIS — J9601 Acute respiratory failure with hypoxia: Secondary | ICD-10-CM | POA: Diagnosis not present

## 2021-02-17 DIAGNOSIS — I5031 Acute diastolic (congestive) heart failure: Secondary | ICD-10-CM | POA: Diagnosis not present

## 2021-02-18 DIAGNOSIS — E11649 Type 2 diabetes mellitus with hypoglycemia without coma: Secondary | ICD-10-CM | POA: Diagnosis not present

## 2021-02-18 DIAGNOSIS — E782 Mixed hyperlipidemia: Secondary | ICD-10-CM | POA: Diagnosis not present

## 2021-02-18 DIAGNOSIS — Z87891 Personal history of nicotine dependence: Secondary | ICD-10-CM | POA: Diagnosis not present

## 2021-02-18 DIAGNOSIS — Z9181 History of falling: Secondary | ICD-10-CM | POA: Diagnosis not present

## 2021-02-18 DIAGNOSIS — I5031 Acute diastolic (congestive) heart failure: Secondary | ICD-10-CM | POA: Diagnosis not present

## 2021-02-18 DIAGNOSIS — N184 Chronic kidney disease, stage 4 (severe): Secondary | ICD-10-CM | POA: Diagnosis not present

## 2021-02-18 DIAGNOSIS — E1169 Type 2 diabetes mellitus with other specified complication: Secondary | ICD-10-CM | POA: Diagnosis not present

## 2021-02-18 DIAGNOSIS — Z7984 Long term (current) use of oral hypoglycemic drugs: Secondary | ICD-10-CM | POA: Diagnosis not present

## 2021-02-18 DIAGNOSIS — E875 Hyperkalemia: Secondary | ICD-10-CM | POA: Diagnosis not present

## 2021-02-18 DIAGNOSIS — E1122 Type 2 diabetes mellitus with diabetic chronic kidney disease: Secondary | ICD-10-CM | POA: Diagnosis not present

## 2021-02-18 DIAGNOSIS — J9601 Acute respiratory failure with hypoxia: Secondary | ICD-10-CM | POA: Diagnosis not present

## 2021-02-18 DIAGNOSIS — Z9981 Dependence on supplemental oxygen: Secondary | ICD-10-CM | POA: Diagnosis not present

## 2021-02-18 DIAGNOSIS — Z853 Personal history of malignant neoplasm of breast: Secondary | ICD-10-CM | POA: Diagnosis not present

## 2021-02-18 DIAGNOSIS — J4520 Mild intermittent asthma, uncomplicated: Secondary | ICD-10-CM | POA: Diagnosis not present

## 2021-02-18 DIAGNOSIS — N179 Acute kidney failure, unspecified: Secondary | ICD-10-CM | POA: Diagnosis not present

## 2021-02-18 DIAGNOSIS — Z794 Long term (current) use of insulin: Secondary | ICD-10-CM | POA: Diagnosis not present

## 2021-02-18 DIAGNOSIS — M1990 Unspecified osteoarthritis, unspecified site: Secondary | ICD-10-CM | POA: Diagnosis not present

## 2021-02-18 DIAGNOSIS — Z7982 Long term (current) use of aspirin: Secondary | ICD-10-CM | POA: Diagnosis not present

## 2021-02-18 DIAGNOSIS — Z8673 Personal history of transient ischemic attack (TIA), and cerebral infarction without residual deficits: Secondary | ICD-10-CM | POA: Diagnosis not present

## 2021-02-18 DIAGNOSIS — Z7951 Long term (current) use of inhaled steroids: Secondary | ICD-10-CM | POA: Diagnosis not present

## 2021-02-18 DIAGNOSIS — I130 Hypertensive heart and chronic kidney disease with heart failure and stage 1 through stage 4 chronic kidney disease, or unspecified chronic kidney disease: Secondary | ICD-10-CM | POA: Diagnosis not present

## 2021-02-24 DIAGNOSIS — M1990 Unspecified osteoarthritis, unspecified site: Secondary | ICD-10-CM | POA: Diagnosis not present

## 2021-02-24 DIAGNOSIS — Z87891 Personal history of nicotine dependence: Secondary | ICD-10-CM | POA: Diagnosis not present

## 2021-02-24 DIAGNOSIS — E1169 Type 2 diabetes mellitus with other specified complication: Secondary | ICD-10-CM | POA: Diagnosis not present

## 2021-02-24 DIAGNOSIS — Z794 Long term (current) use of insulin: Secondary | ICD-10-CM | POA: Diagnosis not present

## 2021-02-24 DIAGNOSIS — E11649 Type 2 diabetes mellitus with hypoglycemia without coma: Secondary | ICD-10-CM | POA: Diagnosis not present

## 2021-02-24 DIAGNOSIS — Z7951 Long term (current) use of inhaled steroids: Secondary | ICD-10-CM | POA: Diagnosis not present

## 2021-02-24 DIAGNOSIS — Z9181 History of falling: Secondary | ICD-10-CM | POA: Diagnosis not present

## 2021-02-24 DIAGNOSIS — J9601 Acute respiratory failure with hypoxia: Secondary | ICD-10-CM | POA: Diagnosis not present

## 2021-02-24 DIAGNOSIS — Z853 Personal history of malignant neoplasm of breast: Secondary | ICD-10-CM | POA: Diagnosis not present

## 2021-02-24 DIAGNOSIS — Z8673 Personal history of transient ischemic attack (TIA), and cerebral infarction without residual deficits: Secondary | ICD-10-CM | POA: Diagnosis not present

## 2021-02-24 DIAGNOSIS — I130 Hypertensive heart and chronic kidney disease with heart failure and stage 1 through stage 4 chronic kidney disease, or unspecified chronic kidney disease: Secondary | ICD-10-CM | POA: Diagnosis not present

## 2021-02-24 DIAGNOSIS — N179 Acute kidney failure, unspecified: Secondary | ICD-10-CM | POA: Diagnosis not present

## 2021-02-24 DIAGNOSIS — N184 Chronic kidney disease, stage 4 (severe): Secondary | ICD-10-CM | POA: Diagnosis not present

## 2021-02-24 DIAGNOSIS — E875 Hyperkalemia: Secondary | ICD-10-CM | POA: Diagnosis not present

## 2021-02-24 DIAGNOSIS — Z9981 Dependence on supplemental oxygen: Secondary | ICD-10-CM | POA: Diagnosis not present

## 2021-02-24 DIAGNOSIS — E1122 Type 2 diabetes mellitus with diabetic chronic kidney disease: Secondary | ICD-10-CM | POA: Diagnosis not present

## 2021-02-24 DIAGNOSIS — E782 Mixed hyperlipidemia: Secondary | ICD-10-CM | POA: Diagnosis not present

## 2021-02-24 DIAGNOSIS — Z7984 Long term (current) use of oral hypoglycemic drugs: Secondary | ICD-10-CM | POA: Diagnosis not present

## 2021-02-24 DIAGNOSIS — Z7982 Long term (current) use of aspirin: Secondary | ICD-10-CM | POA: Diagnosis not present

## 2021-02-24 DIAGNOSIS — I5031 Acute diastolic (congestive) heart failure: Secondary | ICD-10-CM | POA: Diagnosis not present

## 2021-02-24 DIAGNOSIS — J4520 Mild intermittent asthma, uncomplicated: Secondary | ICD-10-CM | POA: Diagnosis not present

## 2021-03-10 DIAGNOSIS — I2584 Coronary atherosclerosis due to calcified coronary lesion: Secondary | ICD-10-CM | POA: Diagnosis not present

## 2021-03-10 DIAGNOSIS — I2510 Atherosclerotic heart disease of native coronary artery without angina pectoris: Secondary | ICD-10-CM | POA: Diagnosis not present

## 2021-03-10 DIAGNOSIS — I5032 Chronic diastolic (congestive) heart failure: Secondary | ICD-10-CM | POA: Diagnosis not present

## 2021-03-10 DIAGNOSIS — I10 Essential (primary) hypertension: Secondary | ICD-10-CM | POA: Diagnosis not present

## 2021-03-17 DIAGNOSIS — J9601 Acute respiratory failure with hypoxia: Secondary | ICD-10-CM | POA: Diagnosis not present

## 2021-03-17 DIAGNOSIS — I5031 Acute diastolic (congestive) heart failure: Secondary | ICD-10-CM | POA: Diagnosis not present

## 2021-03-22 DIAGNOSIS — M1990 Unspecified osteoarthritis, unspecified site: Secondary | ICD-10-CM | POA: Diagnosis not present

## 2021-03-22 DIAGNOSIS — N184 Chronic kidney disease, stage 4 (severe): Secondary | ICD-10-CM | POA: Diagnosis not present

## 2021-03-22 DIAGNOSIS — Z7982 Long term (current) use of aspirin: Secondary | ICD-10-CM | POA: Diagnosis not present

## 2021-03-22 DIAGNOSIS — N179 Acute kidney failure, unspecified: Secondary | ICD-10-CM | POA: Diagnosis not present

## 2021-03-22 DIAGNOSIS — E1122 Type 2 diabetes mellitus with diabetic chronic kidney disease: Secondary | ICD-10-CM | POA: Diagnosis not present

## 2021-03-22 DIAGNOSIS — Z9981 Dependence on supplemental oxygen: Secondary | ICD-10-CM | POA: Diagnosis not present

## 2021-03-22 DIAGNOSIS — Z853 Personal history of malignant neoplasm of breast: Secondary | ICD-10-CM | POA: Diagnosis not present

## 2021-03-22 DIAGNOSIS — Z8673 Personal history of transient ischemic attack (TIA), and cerebral infarction without residual deficits: Secondary | ICD-10-CM | POA: Diagnosis not present

## 2021-03-22 DIAGNOSIS — E1169 Type 2 diabetes mellitus with other specified complication: Secondary | ICD-10-CM | POA: Diagnosis not present

## 2021-03-22 DIAGNOSIS — I130 Hypertensive heart and chronic kidney disease with heart failure and stage 1 through stage 4 chronic kidney disease, or unspecified chronic kidney disease: Secondary | ICD-10-CM | POA: Diagnosis not present

## 2021-03-22 DIAGNOSIS — I5031 Acute diastolic (congestive) heart failure: Secondary | ICD-10-CM | POA: Diagnosis not present

## 2021-03-22 DIAGNOSIS — Z794 Long term (current) use of insulin: Secondary | ICD-10-CM | POA: Diagnosis not present

## 2021-03-22 DIAGNOSIS — E782 Mixed hyperlipidemia: Secondary | ICD-10-CM | POA: Diagnosis not present

## 2021-03-22 DIAGNOSIS — Z87891 Personal history of nicotine dependence: Secondary | ICD-10-CM | POA: Diagnosis not present

## 2021-03-22 DIAGNOSIS — J9601 Acute respiratory failure with hypoxia: Secondary | ICD-10-CM | POA: Diagnosis not present

## 2021-03-22 DIAGNOSIS — Z7951 Long term (current) use of inhaled steroids: Secondary | ICD-10-CM | POA: Diagnosis not present

## 2021-03-22 DIAGNOSIS — E11649 Type 2 diabetes mellitus with hypoglycemia without coma: Secondary | ICD-10-CM | POA: Diagnosis not present

## 2021-03-22 DIAGNOSIS — J4520 Mild intermittent asthma, uncomplicated: Secondary | ICD-10-CM | POA: Diagnosis not present

## 2021-03-22 DIAGNOSIS — E875 Hyperkalemia: Secondary | ICD-10-CM | POA: Diagnosis not present

## 2021-03-22 DIAGNOSIS — Z9181 History of falling: Secondary | ICD-10-CM | POA: Diagnosis not present

## 2021-03-22 DIAGNOSIS — Z7984 Long term (current) use of oral hypoglycemic drugs: Secondary | ICD-10-CM | POA: Diagnosis not present

## 2021-03-29 NOTE — Progress Notes (Incomplete)
Patient Care Team: Vicenta Aly, FNP as PCP - General (Nurse Practitioner)  DIAGNOSIS: No diagnosis found.  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 (treatment given at [facility] healthcare: [facility])   03/31/2015 Breast MRI   Large area of non-mass enhancement 7.5 x 4.6 x 4.6 cm, also multiple nonspecific normal appearing lymph nodes in the axilla bilaterally   04/29/2015 Surgery   Left skin sparing mastectomy with immediate reconstruction: Pathology revealed IDC 0.4 cm margins -0/2 lymph nodes negative, ER 0%, PR weakly +11-50%, HER-2/neu 1+, FISH negative, extensive DCIS, T1 a N0 stage I a   05/21/2015 Surgery   Completed left breast reconstruction and right reduction mammoplasty in September 2016   07/01/2015 -  Anti-estrogen oral therapy   Antiestrogen therapy with letrozole 2.5 mg daily     CHIEF COMPLIANT: Follow-up of left breast cancer on letrozole  INTERVAL HISTORY: Beth Blankenship is a 70 y.o. with above-mentioned history of left breast cancer who is currently on letrozole. She presents to the clinic today for follow-up.   ALLERGIES:  is allergic to latex and tetracyclines & related.  MEDICATIONS:  Current Outpatient Medications  Medication Sig Dispense Refill  . albuterol (PROVENTIL HFA;VENTOLIN HFA) 108 (90 Base) MCG/ACT inhaler Inhale 2 puffs into the lungs every 6 (six) hours as needed for wheezing or shortness of breath.    Marland Kitchen alendronate (FOSAMAX) 70 MG tablet TAKE 1 TABLET BY MOUTH EVERY WEEK. 12 tablet 3  . amLODipine (NORVASC) 10 MG tablet Take 1 tablet (10 mg total) by mouth daily. 90 tablet 2  . aspirin EC 325 MG tablet Take 325 mg by mouth daily.    Marland Kitchen atorvastatin (LIPITOR)  20 MG tablet Take 1 tablet (20 mg total) by mouth daily. 90 tablet 2  . Calcium-Vitamin D 600-200 MG-UNIT tablet Take 1 tablet by mouth daily.    Marland Kitchen doxazosin (CARDURA) 4 MG tablet Take 1 tablet (4 mg total) by mouth 2 (two) times daily. 180 tablet 1  . glipiZIDE (GLUCOTROL) 10 MG tablet Take 1 tablet (10 mg total) by mouth 2 (two) times daily before a meal. 60 tablet 2  . hydrochlorothiazide (MICROZIDE) 12.5 MG capsule Take 1 capsule (12.5 mg total) by mouth daily. 90 capsule 1  . insulin regular (HUMULIN R) 100 units/mL injection 34 units in AM and 20 units in PM 10 mL 11  . letrozole (FEMARA) 2.5 MG tablet TAKE 1 TABLET(2.5 MG) BY MOUTH DAILY 90 tablet 3  . lisinopril (PRINIVIL,ZESTRIL) 20 MG tablet Take 1 tablet (20 mg total) by mouth daily. 90 tablet 1  . metFORMIN (GLUCOPHAGE) 1000 MG tablet Take 1 tablet (1,000 mg total) by mouth 2 (two) times daily with a meal. 180 tablet 2  . metoprolol tartrate (LOPRESSOR) 100 MG tablet Take 1 tablet (100 mg total) by mouth 2 (two) times daily. 180 tablet 2   No current facility-administered medications for this visit.    PHYSICAL EXAMINATION: ECOG PERFORMANCE STATUS: {CHL ONC ECOG [phone]}  There were no vitals filed for this visit. There were no vitals filed for this visit.  BREAST:*** No palpable masses or nodules in either right or left breasts. No palpable axillary supraclavicular or infraclavicular adenopathy no breast tenderness or nipple discharge. (exam performed in the presence of  a chaperone)  LABORATORY DATA:  I have reviewed the data as listed CMP Latest Ref Rng & Units 06/26/2017 06/25/2017 06/24/2017  Glucose 65 - 99 mg/dL 228(H) 99 265(H)  BUN 6 - 20 mg/dL '9 9 7  ' Creatinine 0.44 - 1.00 mg/dL 0.70 0.81 0.60  Sodium 135 - 145 mmol/L 138 137 138  Potassium 3.5 - 5.1 mmol/L 4.2 2.9(L) 2.7(LL)  Chloride 101 - 111 mmol/L 107 106 96(L)  CO2 22 - 32 mmol/L 20(L) 22 -  Calcium 8.9 - 10.3 mg/dL 8.7(L) 8.2(L) -  Total Protein 6.5 - 8.1  g/dL - - -  Total Bilirubin 0.3 - 1.2 mg/dL - - -  Alkaline Phos 38 - 126 U/L - - -  AST 15 - 41 U/L - - -  ALT 14 - 54 U/L - - -    Lab Results  Component Value Date   WBC 5.7 07/09/2017   HGB 11.4 (L) 07/09/2017   HCT 33.7 (L) 07/09/2017   MCV 85.4 07/09/2017   PLT 294.0 07/09/2017   NEUTROABS 13.7 (H) 06/24/2017    ASSESSMENT & PLAN:  No problem-specific Assessment & Plan notes found for this encounter.    No orders of the defined types were placed in this encounter.  The patient has a good understanding of the overall plan. she agrees with it. she will call with any problems that may develop before the next visit here.  Total time spent: *** mins including face to face time and time spent for planning, charting and coordination of care  Rulon Eisenmenger, MD, MPH 03/29/2021  I, Molly Dorshimer, am acting as scribe for Dr. Nicholas Lose.  {insert scribe attestation}

## 2021-03-30 ENCOUNTER — Inpatient Hospital Stay: Payer: Medicare Other | Attending: Hematology and Oncology | Admitting: Hematology and Oncology

## 2021-03-30 NOTE — Assessment & Plan Note (Deleted)
04/29/2015 left skin sparing mastectomy with immediate reconstruction: Pathology revealed IDC 0.4 cm margins -0/2 lymph nodes negative, ER 0%, PR weakly +11-50%, HER-2/neu 1+, FISH negative, extensive DCIS, T1 a N0 stage I a Treatment was given at [facility] We assumedher care 04/09/2018  Current treatment:Letrozole 2.5 mg daily started 06/29/2015 (will stop in July 2021)  Letrozole Toxicities: Diffuse muscle aches and pains Carpal Tunnel: right hand Leg: difficulty to walk  Patient takes Fosamax for osteopenia.  Received COVID vaccine  Surveillance: 1.Breast exam 03/30/2020: Benign 2. Right breast mammogram 02/16/2020: Benign breast density category C 3.08/12/2018: Bone density T score -1.5: Osteopenia    Return to clinic once a year for follow-up

## 2021-04-17 DIAGNOSIS — I5031 Acute diastolic (congestive) heart failure: Secondary | ICD-10-CM | POA: Diagnosis not present

## 2021-04-17 DIAGNOSIS — J9601 Acute respiratory failure with hypoxia: Secondary | ICD-10-CM | POA: Diagnosis not present

## 2021-05-27 DIAGNOSIS — U071 COVID-19: Secondary | ICD-10-CM | POA: Diagnosis not present

## 2021-06-25 ENCOUNTER — Other Ambulatory Visit: Payer: Self-pay | Admitting: Hematology and Oncology

## 2021-06-25 DIAGNOSIS — Z853 Personal history of malignant neoplasm of breast: Secondary | ICD-10-CM

## 2021-06-28 ENCOUNTER — Other Ambulatory Visit: Payer: Self-pay

## 2021-06-28 DIAGNOSIS — Z853 Personal history of malignant neoplasm of breast: Secondary | ICD-10-CM

## 2021-06-28 MED ORDER — LETROZOLE 2.5 MG PO TABS: 2.5000 mg | ORAL_TABLET | Freq: Every day | ORAL | 0 refills | Status: DC

## 2021-07-12 ENCOUNTER — Telehealth: Payer: Self-pay

## 2021-07-12 NOTE — Telephone Encounter (Signed)
Patients daughter called in to find out information about an upcoming appointment with a person by the name of norrris. Information given for the [facility].

## 2021-08-04 DIAGNOSIS — R21 Rash and other nonspecific skin eruption: Secondary | ICD-10-CM | POA: Diagnosis not present

## 2021-08-04 DIAGNOSIS — N390 Urinary tract infection, site not specified: Secondary | ICD-10-CM | POA: Diagnosis not present

## 2021-08-04 DIAGNOSIS — E118 Type 2 diabetes mellitus with unspecified complications: Secondary | ICD-10-CM | POA: Diagnosis not present

## 2021-08-04 DIAGNOSIS — R300 Dysuria: Secondary | ICD-10-CM | POA: Diagnosis not present

## 2021-08-04 DIAGNOSIS — N184 Chronic kidney disease, stage 4 (severe): Secondary | ICD-10-CM | POA: Diagnosis not present

## 2021-08-04 DIAGNOSIS — S99922A Unspecified injury of left foot, initial encounter: Secondary | ICD-10-CM | POA: Diagnosis not present

## 2021-08-04 DIAGNOSIS — I10 Essential (primary) hypertension: Secondary | ICD-10-CM | POA: Diagnosis not present

## 2021-11-29 DIAGNOSIS — I10 Essential (primary) hypertension: Secondary | ICD-10-CM | POA: Diagnosis not present

## 2021-11-29 DIAGNOSIS — I5032 Chronic diastolic (congestive) heart failure: Secondary | ICD-10-CM | POA: Diagnosis not present

## 2022-01-10 DIAGNOSIS — I10 Essential (primary) hypertension: Secondary | ICD-10-CM | POA: Diagnosis not present

## 2022-01-10 DIAGNOSIS — I5032 Chronic diastolic (congestive) heart failure: Secondary | ICD-10-CM | POA: Diagnosis not present

## 2022-01-12 DIAGNOSIS — I10 Essential (primary) hypertension: Secondary | ICD-10-CM | POA: Diagnosis not present

## 2022-01-12 DIAGNOSIS — I5032 Chronic diastolic (congestive) heart failure: Secondary | ICD-10-CM | POA: Diagnosis not present

## 2022-01-12 DIAGNOSIS — I350 Nonrheumatic aortic (valve) stenosis: Secondary | ICD-10-CM | POA: Diagnosis not present

## 2022-02-21 ENCOUNTER — Encounter: Payer: Self-pay | Admitting: Emergency Medicine

## 2022-02-21 ENCOUNTER — Emergency Department (INDEPENDENT_AMBULATORY_CARE_PROVIDER_SITE_OTHER)
Admission: EM | Admit: 2022-02-21 | Discharge: 2022-02-21 | Disposition: A | Discharge status: Other Acute Inpatient Hospital | Payer: Medicare Other | Source: Home / Self Care | Attending: Family Medicine | Admitting: Family Medicine

## 2022-02-21 ENCOUNTER — Emergency Department (INDEPENDENT_AMBULATORY_CARE_PROVIDER_SITE_OTHER): Payer: Medicare Other

## 2022-02-21 ENCOUNTER — Other Ambulatory Visit: Payer: Self-pay

## 2022-02-21 DIAGNOSIS — Z7982 Long term (current) use of aspirin: Secondary | ICD-10-CM | POA: Diagnosis not present

## 2022-02-21 DIAGNOSIS — R0602 Shortness of breath: Secondary | ICD-10-CM

## 2022-02-21 DIAGNOSIS — Z87891 Personal history of nicotine dependence: Secondary | ICD-10-CM | POA: Diagnosis not present

## 2022-02-21 DIAGNOSIS — Z853 Personal history of malignant neoplasm of breast: Secondary | ICD-10-CM | POA: Diagnosis not present

## 2022-02-21 DIAGNOSIS — J811 Chronic pulmonary edema: Secondary | ICD-10-CM | POA: Diagnosis not present

## 2022-02-21 DIAGNOSIS — I2510 Atherosclerotic heart disease of native coronary artery without angina pectoris: Secondary | ICD-10-CM | POA: Diagnosis not present

## 2022-02-21 DIAGNOSIS — J189 Pneumonia, unspecified organism: Secondary | ICD-10-CM | POA: Diagnosis not present

## 2022-02-21 DIAGNOSIS — Z79899 Other long term (current) drug therapy: Secondary | ICD-10-CM | POA: Diagnosis not present

## 2022-02-21 DIAGNOSIS — R590 Localized enlarged lymph nodes: Secondary | ICD-10-CM | POA: Diagnosis not present

## 2022-02-21 DIAGNOSIS — R0789 Other chest pain: Secondary | ICD-10-CM | POA: Diagnosis not present

## 2022-02-21 DIAGNOSIS — R079 Chest pain, unspecified: Secondary | ICD-10-CM

## 2022-02-21 DIAGNOSIS — E119 Type 2 diabetes mellitus without complications: Secondary | ICD-10-CM | POA: Diagnosis not present

## 2022-02-21 DIAGNOSIS — Z794 Long term (current) use of insulin: Secondary | ICD-10-CM | POA: Diagnosis not present

## 2022-02-21 DIAGNOSIS — Z9012 Acquired absence of left breast and nipple: Secondary | ICD-10-CM | POA: Diagnosis not present

## 2022-02-21 DIAGNOSIS — Z91040 Latex allergy status: Secondary | ICD-10-CM | POA: Diagnosis not present

## 2022-02-21 DIAGNOSIS — J45909 Unspecified asthma, uncomplicated: Secondary | ICD-10-CM | POA: Diagnosis not present

## 2022-02-21 DIAGNOSIS — I10 Essential (primary) hypertension: Secondary | ICD-10-CM | POA: Diagnosis not present

## 2022-02-21 DIAGNOSIS — Z881 Allergy status to other antibiotic agents status: Secondary | ICD-10-CM | POA: Diagnosis not present

## 2022-02-21 DIAGNOSIS — Z20822 Contact with and (suspected) exposure to covid-19: Secondary | ICD-10-CM | POA: Diagnosis not present

## 2022-02-21 DIAGNOSIS — Z91048 Other nonmedicinal substance allergy status: Secondary | ICD-10-CM | POA: Diagnosis not present

## 2022-02-21 DIAGNOSIS — J810 Acute pulmonary edema: Secondary | ICD-10-CM | POA: Diagnosis not present

## 2022-02-21 DIAGNOSIS — J90 Pleural effusion, not elsewhere classified: Secondary | ICD-10-CM | POA: Diagnosis not present

## 2022-02-21 DIAGNOSIS — Z87442 Personal history of urinary calculi: Secondary | ICD-10-CM | POA: Diagnosis not present

## 2022-02-21 DIAGNOSIS — Z8673 Personal history of transient ischemic attack (TIA), and cerebral infarction without residual deficits: Secondary | ICD-10-CM | POA: Diagnosis not present

## 2022-02-21 NOTE — ED Triage Notes (Signed)
Pt c/o shortness of breath and chest pain for 2-3 days but feels like is getting worse. States her 02 at home when she checks it has been around 63s

## 2022-02-21 NOTE — ED Notes (Signed)
Pt Spo2 94 after chest xray. Patient state her chest pain is much better now.

## 2022-02-21 NOTE — ED Triage Notes (Signed)
Patient is being discharged from the Urgent Care and sent to the Emergency Department via private vehicle with spouse . Per Dr  Meda Coffee, patient is in need of higher level of care due to shortness of breath, hypoxia and pneumonia . Patient is aware and verbalizes understanding of plan of care.  Vitals:   02/21/22 1344 02/21/22 1401  BP: (!) 148/69   Pulse: 60 70  Resp:    Temp:    SpO2: 94% 94%

## 2022-02-21 NOTE — Discharge Instructions (Signed)
You need to go to the emergency room for further evaluation of your chest pain, shortness of breath, and abnormal chest x-ray. Go directly to the emergency room

## 2022-02-21 NOTE — ED Provider Notes (Signed)
[facility]    CSN: [id number] Arrival date & time: 02/21/22  1237      History   Chief Complaint Chief Complaint  Patient presents with   Shortness of Breath    HPI Beth Blankenship is a 71 y.o. female.   HPI  Patient states that she has had more coughing, chest pain, and shortness of breath for the last few days.  She is here with her husband.  They called EMS a couple of days ago.  The paramedics came out and evaluated her, did an EKG, did vital signs, and told her they thought she had anxiety.  Husband states he agrees with this assessment.  She is here because she is continuing to have chest pain.  She states that it is in the center of her chest and points to her mid sternum.  It is there all the time.  She is also short of breath.  She has been having some dizzy spells.  She has been feeling more tired.  She states that she has had chills. Her oxygen level at home has been usually around 90, but has dropped into the 80s and high 70s many times COVID testing at home was negative No one else at home is ill Past medical history is reviewed.  Patient has significant history of heart disease, congestive heart failure, and diabetes.  She has history of TIAs.  She has a history of depression, debility, delirium.  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 ([place]) 01/20/2021   Hyperkalemia 01/20/2021   AMS  (altered mental status) 12/03/2020   Delirium due to another medical condition 12/03/2020   Psychosis ([place]) 47/42/5956   Acute diastolic CHF (congestive heart failure) ([place]) 10/26/2020   SOB (shortness of breath) 10/25/2020   Severe obesity (BMI 35.0-39.9) with comorbidity ([place]) 01/23/2019   Malignant neoplasm of upper-outer quadrant of left breast in female, estrogen receptor negative ([place]) 04/09/2018   Transient ischemic attack 02/13/2018   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   Mixed hyperlipidemia 02/06/2018   Asthma, intermittent, uncomplicated 38/75/6433   Cellulitis of neck 06/24/2017   Sialadenitis 06/24/2017   Lactic acidosis 06/24/2017   Salivary duct stone 06/24/2017   Essential hypertension    History of TIA (transient ischemic attack)    Diabetes mellitus without complication ([place])    Arthritis    History of breast cancer    H/O mastectomy    History of breast reconstruction    Heart murmur 01/13/2014   Obesity (BMI 30.0-34.9) 01/13/2014    Past Surgical History:  Procedure Laterality Date   MASTECTOMY Left 04/29/2015   REDUCTION MAMMAPLASTY Right    SHOULDER SURGERY Right    TUBAL LIGATION      OB History   No obstetric history on file.      Home Medications    Prior  to Admission medications   Medication Sig Start Date End Date Taking? Authorizing Provider  albuterol (PROVENTIL HFA;VENTOLIN HFA) 108 (90 Base) MCG/ACT inhaler Inhale 2 puffs into the lungs every 6 (six) hours as needed for wheezing or shortness of breath.    [provider]  alendronate (FOSAMAX) 70 MG tablet TAKE 1 TABLET BY MOUTH EVERY WEEK. 03/18/20   Nicholas Lose, MD  amLODipine (NORVASC) 10 MG tablet Take 1 tablet (10 mg total) by mouth daily. 07/09/17   [place], Betty G, MD  aspirin EC 325 MG tablet Take 325 mg by mouth daily.    [provider]  atorvastatin (LIPITOR) 20 MG tablet Take 1 tablet (20 mg total) by mouth daily. 07/09/17   [place], Betty G, MD  Calcium-Vitamin D  600-200 MG-UNIT tablet Take 1 tablet by mouth daily.    [provider]  doxazosin (CARDURA) 4 MG tablet Take 1 tablet (4 mg total) by mouth 2 (two) times daily. 07/09/17   [place], Betty G, MD  glipiZIDE (GLUCOTROL) 10 MG tablet Take 1 tablet (10 mg total) by mouth 2 (two) times daily before a meal. 07/09/17   [place], Betty G, MD  hydrochlorothiazide (MICROZIDE) 12.5 MG capsule Take 1 capsule (12.5 mg total) by mouth daily. 07/09/17   [place], Betty G, MD  insulin regular (HUMULIN R) 100 units/mL injection 34 units in AM and 20 units in PM 04/09/18   Nicholas Lose, MD  letrozole [facility]) 2.5 MG tablet Take 1 tablet (2.5 mg total) by mouth daily. 06/28/21   Nicholas Lose, MD  lisinopril (PRINIVIL,ZESTRIL) 20 MG tablet Take 1 tablet (20 mg total) by mouth daily. 07/09/17   [place], Betty G, MD  metFORMIN (GLUCOPHAGE) 1000 MG tablet Take 1 tablet (1,000 mg total) by mouth 2 (two) times daily with a meal. 07/09/17   [place], Betty G, MD  metoprolol tartrate (LOPRESSOR) 100 MG tablet Take 1 tablet (100 mg total) by mouth 2 (two) times daily. 07/09/17   [place], Betty G, MD    Family History Family History  Problem Relation Age of Onset   Heart disease Mother    Hypertension Mother    Stroke Mother    Heart disease Father    Diabetes Paternal Aunt    Diabetes Paternal Uncle    Cancer Maternal Grandmother        colon    Social History Social History   Tobacco Use   Smoking status: Former   Smokeless tobacco: Never  Substance Use Topics   Alcohol use: No   Drug use: No     Allergies   Latex and Tetracyclines & related   Review of Systems Review of Systems See HPI  Physical Exam Triage Vital Signs ED Triage Vitals  Enc Vitals Group     BP 02/21/22 1258 (!) 186/84     Pulse Rate 02/21/22 1258 77     Resp 02/21/22 1258 20     Temp 02/21/22 1258 98 F (36.7 C)     Temp Source 02/21/22 1258 Oral     SpO2 02/21/22 1258 90 %     Weight --      Height --      Head Circumference  --      Peak Flow --      Pain Score 02/21/22 1259 8     Pain Loc --      Pain Edu? --      Excl. in [place]? --    No data found.  Updated  Vital Signs BP (!) 148/69 (BP Location: Right Arm)    Pulse 70    Temp 98 F (36.7 C) (Oral)    Resp 20    SpO2 94% Comment: while ambulating     Physical Exam Constitutional:      General: She is in acute distress.     Comments: Dyspneic with conversation.  Appears sad, despondent, tearful  HENT:     Right Ear: Tympanic membrane and ear canal normal.     Left Ear: Tympanic membrane and ear canal normal.     Nose: No congestion.     Mouth/Throat:     Mouth: Mucous membranes are moist.     Pharynx: No posterior oropharyngeal erythema.  Eyes:     Pupils: Pupils are equal, round, and reactive to light.  Neck:     Vascular: No carotid bruit.  Cardiovascular:     Rate and Rhythm: Regular rhythm.     Heart sounds: Normal heart sounds.  Pulmonary:     Breath sounds: Rales present.     Comments: Bibasilar rales Abdominal:     General: Abdomen is flat.     Tenderness: There is no abdominal tenderness.  Musculoskeletal:        General: Swelling present.  Lymphadenopathy:     Cervical: No cervical adenopathy.  Skin:    General: Skin is warm.  Neurological:     Mental Status: She is alert.     UC Treatments / Results  Labs (all labs ordered are listed, but only abnormal results are displayed) Labs Reviewed - No data to display  EKG There is no EKG for comparison.  Patient has a normal rate and rhythm, and as ST changes.  No acute findings  Radiology DG Chest 2 View  Result Date: 02/21/2022 CLINICAL DATA:  Short of breath and chest pain EXAM: CHEST - 2 VIEW COMPARISON:  None. FINDINGS: Heart size upper normal.  Negative for heart failure. Prominent right hilum.  Possible mass or adenopathy. Probable infiltrate in the lingula. This may represent pneumonia. Small bilateral pleural effusions. IMPRESSION: Probable lingular pneumonia.  Small  bilateral pleural effusions. Prominent right hilum. Possible mass or adenopathy. CT chest with contrast recommended for further evaluation. Electronically Signed   By: Franchot Gallo M.D.   On: 02/21/2022 13:59    Procedures Procedures (including critical care time)  Medications Ordered in UC Medications - No data to display  Initial Impression / Assessment and Plan / UC Course  I have reviewed the triage vital signs and the nursing notes.  Pertinent labs & imaging results that were available during my care of the patient were reviewed by me and considered in my medical decision making (see chart for details).     I explained to the patient thatHer medical problems require additional evaluation that I am unable to provide.  I feel like if she has been hypoxic at home, has increased tearfulness and shortness of breath with her Pneumonia, and her history of heart disease, Additional radiology and laboratory evaluation is indicated Final Clinical Impressions(s) / UC Diagnoses   Final diagnoses:  SOB (shortness of breath)  Community acquired pneumonia, unspecified laterality     Discharge Instructions      You need to go to the emergency room for further evaluation of your chest pain, shortness of breath, and abnormal chest x-ray. Go directly to the emergency room   ED Prescriptions   None    PDMP not reviewed this encounter.   Meda Coffee,  Jennette Banker, MD 02/21/22 [phone]

## 2022-02-24 DIAGNOSIS — Z0000 Encounter for general adult medical examination without abnormal findings: Secondary | ICD-10-CM | POA: Diagnosis not present

## 2022-02-24 DIAGNOSIS — N184 Chronic kidney disease, stage 4 (severe): Secondary | ICD-10-CM | POA: Diagnosis not present

## 2022-02-24 DIAGNOSIS — J4520 Mild intermittent asthma, uncomplicated: Secondary | ICD-10-CM | POA: Diagnosis not present

## 2022-02-24 DIAGNOSIS — R5381 Other malaise: Secondary | ICD-10-CM | POA: Diagnosis not present

## 2022-02-24 DIAGNOSIS — I5032 Chronic diastolic (congestive) heart failure: Secondary | ICD-10-CM | POA: Diagnosis not present

## 2022-02-24 DIAGNOSIS — E782 Mixed hyperlipidemia: Secondary | ICD-10-CM | POA: Diagnosis not present

## 2022-02-24 DIAGNOSIS — I10 Essential (primary) hypertension: Secondary | ICD-10-CM | POA: Diagnosis not present

## 2022-02-24 DIAGNOSIS — E118 Type 2 diabetes mellitus with unspecified complications: Secondary | ICD-10-CM | POA: Diagnosis not present

## 2022-03-23 DIAGNOSIS — E1165 Type 2 diabetes mellitus with hyperglycemia: Secondary | ICD-10-CM | POA: Diagnosis not present

## 2022-03-23 DIAGNOSIS — Z9109 Other allergy status, other than to drugs and biological substances: Secondary | ICD-10-CM | POA: Diagnosis not present

## 2022-03-23 DIAGNOSIS — Z881 Allergy status to other antibiotic agents status: Secondary | ICD-10-CM | POA: Diagnosis not present

## 2022-03-23 DIAGNOSIS — Z87891 Personal history of nicotine dependence: Secondary | ICD-10-CM | POA: Diagnosis not present

## 2022-03-23 DIAGNOSIS — Z91040 Latex allergy status: Secondary | ICD-10-CM | POA: Diagnosis not present

## 2022-03-23 DIAGNOSIS — R9082 White matter disease, unspecified: Secondary | ICD-10-CM | POA: Diagnosis not present

## 2022-03-23 DIAGNOSIS — J301 Allergic rhinitis due to pollen: Secondary | ICD-10-CM | POA: Diagnosis not present

## 2022-03-23 DIAGNOSIS — Z794 Long term (current) use of insulin: Secondary | ICD-10-CM | POA: Diagnosis not present

## 2022-03-23 DIAGNOSIS — I10 Essential (primary) hypertension: Secondary | ICD-10-CM | POA: Diagnosis not present

## 2022-03-23 DIAGNOSIS — G4700 Insomnia, unspecified: Secondary | ICD-10-CM | POA: Diagnosis not present

## 2022-03-23 DIAGNOSIS — E119 Type 2 diabetes mellitus without complications: Secondary | ICD-10-CM | POA: Diagnosis not present

## 2022-03-23 DIAGNOSIS — Z79899 Other long term (current) drug therapy: Secondary | ICD-10-CM | POA: Diagnosis not present

## 2022-04-14 DIAGNOSIS — Z91040 Latex allergy status: Secondary | ICD-10-CM | POA: Diagnosis not present

## 2022-04-14 DIAGNOSIS — N390 Urinary tract infection, site not specified: Secondary | ICD-10-CM | POA: Diagnosis not present

## 2022-04-14 DIAGNOSIS — E1169 Type 2 diabetes mellitus with other specified complication: Secondary | ICD-10-CM | POA: Diagnosis not present

## 2022-04-14 DIAGNOSIS — Z794 Long term (current) use of insulin: Secondary | ICD-10-CM | POA: Diagnosis not present

## 2022-04-14 DIAGNOSIS — Z881 Allergy status to other antibiotic agents status: Secondary | ICD-10-CM | POA: Diagnosis not present

## 2022-04-14 DIAGNOSIS — E785 Hyperlipidemia, unspecified: Secondary | ICD-10-CM | POA: Diagnosis not present

## 2022-04-14 DIAGNOSIS — Z20822 Contact with and (suspected) exposure to covid-19: Secondary | ICD-10-CM | POA: Diagnosis not present

## 2022-04-14 DIAGNOSIS — Z9109 Other allergy status, other than to drugs and biological substances: Secondary | ICD-10-CM | POA: Diagnosis not present

## 2022-04-14 DIAGNOSIS — Z79899 Other long term (current) drug therapy: Secondary | ICD-10-CM | POA: Diagnosis not present

## 2022-04-14 DIAGNOSIS — I10 Essential (primary) hypertension: Secondary | ICD-10-CM | POA: Diagnosis not present

## 2022-04-14 DIAGNOSIS — E119 Type 2 diabetes mellitus without complications: Secondary | ICD-10-CM | POA: Diagnosis not present

## 2022-04-14 DIAGNOSIS — I5032 Chronic diastolic (congestive) heart failure: Secondary | ICD-10-CM | POA: Diagnosis not present

## 2022-04-14 DIAGNOSIS — Z888 Allergy status to other drugs, medicaments and biological substances status: Secondary | ICD-10-CM | POA: Diagnosis not present

## 2022-04-14 DIAGNOSIS — F17210 Nicotine dependence, cigarettes, uncomplicated: Secondary | ICD-10-CM | POA: Diagnosis not present

## 2022-04-18 DIAGNOSIS — I10 Essential (primary) hypertension: Secondary | ICD-10-CM | POA: Diagnosis not present

## 2022-04-18 DIAGNOSIS — Z9012 Acquired absence of left breast and nipple: Secondary | ICD-10-CM | POA: Diagnosis not present

## 2022-04-18 DIAGNOSIS — J4521 Mild intermittent asthma with (acute) exacerbation: Secondary | ICD-10-CM | POA: Diagnosis not present

## 2022-04-18 DIAGNOSIS — E1165 Type 2 diabetes mellitus with hyperglycemia: Secondary | ICD-10-CM | POA: Diagnosis not present

## 2022-04-18 DIAGNOSIS — Z853 Personal history of malignant neoplasm of breast: Secondary | ICD-10-CM | POA: Diagnosis not present

## 2022-04-18 DIAGNOSIS — N184 Chronic kidney disease, stage 4 (severe): Secondary | ICD-10-CM | POA: Diagnosis not present

## 2022-04-18 DIAGNOSIS — I5032 Chronic diastolic (congestive) heart failure: Secondary | ICD-10-CM | POA: Diagnosis not present

## 2022-04-18 DIAGNOSIS — Z8673 Personal history of transient ischemic attack (TIA), and cerebral infarction without residual deficits: Secondary | ICD-10-CM | POA: Diagnosis not present

## 2022-04-18 DIAGNOSIS — J302 Other seasonal allergic rhinitis: Secondary | ICD-10-CM | POA: Diagnosis not present

## 2022-04-18 DIAGNOSIS — E782 Mixed hyperlipidemia: Secondary | ICD-10-CM | POA: Diagnosis not present

## 2022-04-20 DIAGNOSIS — I10 Essential (primary) hypertension: Secondary | ICD-10-CM | POA: Diagnosis not present

## 2022-04-20 DIAGNOSIS — N184 Chronic kidney disease, stage 4 (severe): Secondary | ICD-10-CM | POA: Diagnosis not present

## 2022-04-20 DIAGNOSIS — Z853 Personal history of malignant neoplasm of breast: Secondary | ICD-10-CM | POA: Diagnosis not present

## 2022-04-20 DIAGNOSIS — Z8673 Personal history of transient ischemic attack (TIA), and cerebral infarction without residual deficits: Secondary | ICD-10-CM | POA: Diagnosis not present

## 2022-04-20 DIAGNOSIS — I5032 Chronic diastolic (congestive) heart failure: Secondary | ICD-10-CM | POA: Diagnosis not present

## 2022-04-22 DIAGNOSIS — J302 Other seasonal allergic rhinitis: Secondary | ICD-10-CM | POA: Diagnosis not present

## 2022-04-22 DIAGNOSIS — I10 Essential (primary) hypertension: Secondary | ICD-10-CM | POA: Diagnosis not present

## 2022-04-22 DIAGNOSIS — I5032 Chronic diastolic (congestive) heart failure: Secondary | ICD-10-CM | POA: Diagnosis not present

## 2022-04-22 DIAGNOSIS — Z9012 Acquired absence of left breast and nipple: Secondary | ICD-10-CM | POA: Diagnosis not present

## 2022-04-22 DIAGNOSIS — J4521 Mild intermittent asthma with (acute) exacerbation: Secondary | ICD-10-CM | POA: Diagnosis not present

## 2022-04-22 DIAGNOSIS — E782 Mixed hyperlipidemia: Secondary | ICD-10-CM | POA: Diagnosis not present

## 2022-04-22 DIAGNOSIS — E1165 Type 2 diabetes mellitus with hyperglycemia: Secondary | ICD-10-CM | POA: Diagnosis not present

## 2022-04-22 DIAGNOSIS — Z8673 Personal history of transient ischemic attack (TIA), and cerebral infarction without residual deficits: Secondary | ICD-10-CM | POA: Diagnosis not present

## 2022-04-22 DIAGNOSIS — N184 Chronic kidney disease, stage 4 (severe): Secondary | ICD-10-CM | POA: Diagnosis not present

## 2022-04-22 DIAGNOSIS — Z853 Personal history of malignant neoplasm of breast: Secondary | ICD-10-CM | POA: Diagnosis not present

## 2022-10-16 ENCOUNTER — Ambulatory Visit (HOSPITAL_COMMUNITY)
Admission: EM | Admit: 2022-10-16 | Discharge: 2022-10-16 | Disposition: A | Discharge status: Home / Self Care | Payer: Medicare Other | Attending: Psychiatry | Admitting: Psychiatry

## 2022-10-16 DIAGNOSIS — R45851 Suicidal ideations: Secondary | ICD-10-CM | POA: Insufficient documentation

## 2022-10-16 DIAGNOSIS — F333 Major depressive disorder, recurrent, severe with psychotic symptoms: Secondary | ICD-10-CM | POA: Diagnosis present

## 2022-10-16 DIAGNOSIS — F419 Anxiety disorder, unspecified: Secondary | ICD-10-CM | POA: Insufficient documentation

## 2022-10-16 DIAGNOSIS — Z79899 Other long term (current) drug therapy: Secondary | ICD-10-CM | POA: Diagnosis not present

## 2022-10-16 MED ORDER — QUETIAPINE FUMARATE 100 MG PO TABS: 100.0000 mg | ORAL_TABLET | Freq: Every day | ORAL | 0 refills | Status: DC

## 2022-10-16 MED ORDER — SERTRALINE HCL 100 MG PO TABS: 150.0000 mg | ORAL_TABLET | Freq: Every day | ORAL | 2 refills | Status: DC

## 2022-10-16 MED ORDER — TRAZODONE HCL 100 MG PO TABS: 100.0000 mg | ORAL_TABLET | Freq: Every day | ORAL | 0 refills | Status: DC

## 2022-10-16 MED ORDER — PALIPERIDONE ER 3 MG PO TB24: 3.0000 mg | ORAL_TABLET | Freq: Every day | ORAL | 0 refills | Status: DC

## 2022-10-16 MED ORDER — HYDROXYZINE HCL 25 MG PO TABS: 25.0000 mg | ORAL_TABLET | Freq: Three times a day (TID) | ORAL | 0 refills | Status: DC | PRN

## 2022-10-16 MED ORDER — BUSPIRONE HCL 10 MG PO TABS: 10.0000 mg | ORAL_TABLET | Freq: Two times a day (BID) | ORAL | 0 refills | Status: DC

## 2022-10-16 NOTE — Discharge Summary (Signed)
Chimene Salo to be D/C'd Home per NP order. An After Visit Summary was printed and given to the patient by provider. Patient escorted out and D/C home via private auto.  Clois Dupes  10/16/2022 3:36 PM

## 2022-10-16 NOTE — Progress Notes (Signed)
   10/16/22 1341  [place] (Walk-ins at [facility] only)  How Did You Hear About [place]? Family/Friend  What Is the Reason for Your Visit/Call Today? Patient presents with husband for assessment due to worsening depression with psychosis.  Patient is tearful on arrival, repeating "I want to die.  The Devil is going to kill me.  Please help me." She reports worsening intrusive thoughts or thought insertion, stating the devil is sending messages to her brian to kill herself and her family.  The devil has mentioned patient putting visine drops in her water to kill herself.  Patient denies SI, outside of the context of the voices and wanting them to go away.  She denies intent to harm anyone.  She is observed hugging her husband and stating she doesn't want to leave him.  Patient denies HI or SA hx.  How Long Has This Been Causing You Problems? 1 wk - 1 month  Have You Recently Had Any Thoughts About Hurting Yourself? Yes  How long ago did you have thoughts about hurting yourself? voice of devil telling her to put eye drops in her water to kill herself - this morning most recently  Are You Planning to Long At This time? No  Have you Recently Had Thoughts About [place]? Yes  How long ago did you have thoughts of harming others? voice of devil telling her to kill family and stating he will kill family  Are You Planning To Harm Someone At This Time? No  Are you currently experiencing any auditory, visual or other hallucinations? Yes  Please explain the hallucinations you are currently experiencing: denies AH, however endorses thought insertion of devil sending messages to her brain  Have You Used Any Alcohol or Drugs in the Past 24 Hours? No  Do you have any current medical co-morbidities that require immediate attention? Yes  Please describe current medical co-morbidities that require immediate attention: CHF, on O2 , in wheelchair  Clinician description of patient  physical appearance/behavior: patient is tearful throughout assessment, she is AAOx5  What Do You Feel Would Help You the Most Today? Treatment for Depression or other mood problem;Medication(s)  If access to [facility] was not available, would you have sought care in the Emergency Department? No  Determination of Need Urgent (48 hours)  Options For Referral Medication Management;BH Urgent Care

## 2022-10-16 NOTE — ED Provider Notes (Signed)
Behavioral Health Urgent Care Medical Screening Exam  Patient Name: Beth Blankenship MRN: [id number] Date of Evaluation: 10/16/22 Chief Complaint:   Diagnosis:  Final diagnoses:  Severe episode of recurrent major depressive disorder, with psychotic features ([place])    History of Present illness: Beth Blankenship is a 71 y.o. female Beth Blankenship 71 y.o., female patient presented to [facility] as a walk in accompanied by her spouse with complaints of "I just want to die".  Beth Blankenship, 71 y.o., female patient seen face to face by this provider, consulted with Dr. Dwyane Dee for; and chart reviewed on 10/16/22.  Per chart review patient has a past psychiatric history of GAD and MDD with psychosis.  She is prescribed Seroquel 100 mg nightly, hydroxyzine 25 mg every 8 hours as needed, Invega 3 mg daily, sertraline 150 mg daily, BuSpar 5 mg twice daily and trazodone 100 mg nightly as needed.  She had services in place with [facility] but was dismissed from their services.  She is currently been off of her Lorayne Bender, BuSpar and Zoloft for the past week.  Patient spouse is present during the evaluation with patient's permission.  On evaluation Beth Blankenship reports over the past week she has experienced increased depression and anxiety.With feelings of helplessness, hopelessness, tearfulness, decreased appetite and sleep.    She has begun having intusive thoughts that the devil is telling her to kill herself or he will kill her.  She denies any AVH reports these are just intrusive thoughts being put in her head.  States these thoughts have included putting Visine eyedrops in her water.  States if she does not kill herself then the devil will hurt her family.  Patient denies having any intent to act on her suicidal ideations.  States these are just thoughts but they do scare her.  She is contracting for safety and states she does not want to harm herself or anyone around her.  Her spouse states  she has no intent to hurt herself and he has removed any items or medications that could possibly be used in any act to hurt herself out of patient's access.  He administers all medications.  He has removed the eyedrops as well.  He has no immediate safety concerns with patient returning home.  Reports patient was stable after being discharged from [facility] 2 months ago.  She was doing good until she ran out of medications roughly 1 week ago.  Of note per chart review patient presented with a similar presentation at that time.  Spouse is patient's primary caregiver and assist her with ADLs.  She is in a wheelchair and can only stand with assist.  She requires continuous 2 L of oxygen.  Patient's husband is very supportive and is able to calm patient throughout the assessment.  He is requesting that medications be restarted and bridged until they are able to make an outpatient appointment for medication management.  During evaluation Beth Blankenship is observed sitting in the assessment area in her wheelchair.  She is initially tearful and distressed.  She is fairly groomed and makes fleeting eye contact.  She is able to answer questions appropriately.  She is alert/oriented x4 and cooperative.  Her speech is clear, coherent, and a normal rate and tone. Throughout the assessment patient is able to become calm and remained that way throughout the rest of the assessment.  She denies HI/AVH.  She does not appear to be responding to internal  stimuli  She is able to converse coherently without any distractibility or any internal preoccupation.  During the evaluation patient ran out of oxygen she was provided with temporary oxygen throughout the assessment.  Discussed restarting patient's medications and ordering EKG.  Patient and spouse are in agreement.  QT/QTc on EKG is 424/454.  Discussed increasing BuSpar to 10 mg twice daily.  Prescriptions were sent to patient's pharmacy of choice these  included: BuSpar 10 mg twice daily, Seroquel 100 mg nightly, Invega 3 mg daily, sertraline 150 mg daily, hydroxyzine 25 mg every 8 hours as needed, trazodone 100 mg nightly as needed.  In addition [place] behavioral health outpatient in [facility] was contacted and an appointment was made for 09/25/2022 with a 8:45 AM.  Patient and spouse verbalized understanding and are in agreement.  They expressed their appreciation.  At this time Beth Blankenship is educated and verbalizes understanding of mental health resources and other crisis services in the community. She is instructed to call 911 and present to the nearest emergency room should he experience any suicidal/homicidal ideation, auditory/visual/hallucinations, or detrimental worsening of her mental health condition.  She was a also advised by Probation officer that she could call the toll-free phone on back of  insurance card to assist with identifying in network counselors and agencies or number on back of insurance card.   Psychiatric Specialty Exam  Presentation  General Appearance:Fairly Groomed  Eye Contact:Fair  Speech:Clear and Coherent; Normal Rate  Speech Volume:Normal  Handedness:Right   Mood and Affect  Mood: Anxious; Depressed  Affect: Tearful; Congruent   Thought Process  Thought Processes: Coherent  Descriptions of Associations:Intact  Orientation:Full (Time, Place and Person)  Thought Content:No data recorded Diagnosis of Schizophrenia or Schizoaffective disorder in past: No   Hallucinations:None  Ideas of Reference:None  Suicidal Thoughts:Yes, Passive Without Intent; With Plan; Without Access to Means  Homicidal Thoughts:No   Sensorium  Memory: Immediate Good; Remote Good; Recent Good  Judgment: Fair  Insight: Good   Executive Functions  Concentration: Good  Attention Span: Good  Recall: Good  Fund of Knowledge: Good  Language: Good   Psychomotor Activity   Psychomotor Activity: Normal   Assets  Assets: Communication Skills; Desire for Improvement; Resilience; Social Support; Catering manager; Housing   Sleep  Sleep: Fair  Number of hours: No data recorded  No data recorded  Physical Exam: Physical Exam Vitals and nursing note reviewed.  Constitutional:      General: She is not in acute distress.    Appearance: Normal appearance. She is not ill-appearing.  HENT:     Head: Normocephalic.  Eyes:     General:        Right eye: No discharge.        Left eye: No discharge.     Conjunctiva/sclera: Conjunctivae normal.     Pupils: Pupils are equal, round, and reactive to light.  Cardiovascular:     Rate and Rhythm: Normal rate.  Pulmonary:     Effort: Pulmonary effort is normal.  Musculoskeletal:        General: Normal range of motion.     Cervical back: Normal range of motion.  Skin:    General: Skin is warm and dry.     Coloration: Skin is not jaundiced or pale.  Neurological:     Mental Status: She is alert and oriented to person, place, and time.  Psychiatric:        Attention and Perception: Attention and perception normal.  Mood and Affect: Mood is anxious and depressed. Affect is tearful.        Speech: Speech normal.        Behavior: Behavior is cooperative.        Thought Content: Thought content includes suicidal ideation. Thought content does not include suicidal plan.        Cognition and Memory: Cognition normal.        Judgment: Judgment normal.    Review of Systems  Constitutional: Negative.   HENT: Negative.    Eyes: Negative.   Respiratory: Negative.    Cardiovascular: Negative.   Musculoskeletal: Negative.   Skin: Negative.   Neurological: Negative.   Psychiatric/Behavioral:  Positive for depression and suicidal ideas. The patient is nervous/anxious.    Blood pressure (!) 156/90, pulse 68, temperature 98.5 F (36.9 C), temperature source Oral, resp. rate 18, SpO2 98 %.  There is no height or weight on file to calculate BMI.  Musculoskeletal: Strength & Muscle Tone: decreased Gait & Station: unsteady, unable to stand- can stand an pivot with assistance Patient leans: N/A   [facility] MSE Discharge Disposition for Follow up and Recommendations: Based on my evaluation the patient does not appear to have an emergency medical condition and can be discharged with resources and follow up care in outpatient services for Medication Management and Individual Therapy  Discharge patient   Medication sent to patient's pharmacy of choice: BuSpar 10 mg twice daily, Seroquel 100 mg nightly, Invega 3 mg daily, sertraline 150 mg daily, hydroxyzine 25 mg every 8 hours as needed, trazodone 100 mg nightly as needed for one month supply   [place] behavioral health outpatient in [facility] was contacted and an appointment was made for 10/17/2022 with a 8:45 AM.   Safety planning/suicide prevention completed with spouse and patient.  Spouse has no immediate safety concerns with patient returning home and is confident he can manage her care and keep her safe.  Revonda Humphrey, NP 10/16/2022, 4:31 PM

## 2022-10-16 NOTE — BH Assessment (Signed)
Comprehensive Clinical Assessment (CCA) Note  10/16/2022 Beth Blankenship [id number]   Disposition: Per Thomes Lolling, NP patient does not meet inpatient criteria.  Patient to be provided with a bridge Rx for medications, along with referral information for psychiatrists/therapists in the area that accept [facility].   The patient demonstrates the following risk factors for suicide: Chronic risk factors for suicide include: psychiatric disorder of Major Depressive Disorder with psychotic features . Acute risk factors for suicide include: recent discharge from inpatient psychiatry. Protective factors for this patient include: positive social support, responsibility to others (children, family), and hope for the future. Considering these factors, the overall suicide risk at this point appears to be moderate, d/t acute psychosis.  Patient is appropriate for outpatient follow up.  Patient is a 71 year old female with a history of Major Depressive Disorder, recurrent, severe with psychotic features who presents voluntarily, accompanied by husband to [facility] for assessment.  Patient presents due to worsening depression with psychosis.  Patient is tearful on arrival, repeating "I want to die.  The Devil is going to kill me.  Please help me." She reports worsening intrusive thoughts or thought insertion, stating the devil is sending messages to her brian to kill herself and her family.  The devil has mentioned patient putting visine drops in her water to kill herself.  Patient denies SI, outside of the context of the voices and wanting them to go away.  She denies intent to harm anyone.  She is observed hugging her husband and stating she doesn't want to leave him.  Patient denies HI or SA hx.  Per patient's husband, caretaker, patient was hospitalized at [facility] two months ago and was doing fairly well on Invega, Sertraline and Seroquel until she ran out of her Invega and Sertraline a week  ago.  Patient's husband reports he received a discontinuation letter from [facility].  He believes patient was dismissed for a missed appointment. He shares patient began experiencing worsening psychosis since being off of her medications.  Patient's husband became tearful at points, blaming himself for not having her set up with a new provider before she ran out of medication.  He has safety proofed the home and does not believe patient intends to harm herself or him/family.  He believes he can keep her safe if patient is able to get bridge prescriptions to hold her over until she can establish care with an outpatient psychiatrist and therapist.    Chief Complaint: Worsening psychosis Visit Diagnosis: Major Depressive Disorder, recurrent, severe with psychotic features   Flowsheet Row ED from 10/16/2022 in [facility]  Thoughts that you would be better off dead, or of hurting yourself in some way More than half the days  PHQ-9 Total Score 15      [place] ED from 10/16/2022 in [facility] ED from 02/21/2022 in [facility] [place] Moderate Risk No Risk        CCA Screening, Triage and Referral (STR)  Patient Reported Information How did you hear about [place]? Family/Friend  What Is the Reason for Your Visit/Call Today? Patient presents with husband for assessment due to worsening depression with psychosis.  Patient is tearful on arrival, repeating "I want to die.  The Devil is going to kill me.  Please help me." She reports worsening intrusive thoughts or thought insertion, stating the devil is sending messages to her  brian to kill herself and her family.  The devil has mentioned patient putting visine drops in her water to kill herself.  Patient denies SI, outside of the context of the voices and wanting them to go away.  She denies intent to harm anyone.  She is observed  hugging her husband and stating she doesn't want to leave him.  Patient denies HI or SA hx.  How Long Has This Been Causing You Problems? 1 wk - 1 month  What Do You Feel Would Help You the Most Today? Treatment for Depression or other mood problem; Medication(s)   Have You Recently Had Any Thoughts About Hurting Yourself? Yes  Are You Planning to Commit Suicide/Harm Yourself At This time? No   Have you Recently Had Thoughts About [place]? Yes  Are You Planning to Harm Someone at This Time? No  Explanation: No data recorded  Have You Used Any Alcohol or Drugs in the Past 24 Hours? No  How Long Ago Did You Use Drugs or Alcohol? No data recorded What Did You Use and How Much? No data recorded  Do You Currently Have a Therapist/Psychiatrist? No data recorded Name of Therapist/Psychiatrist: No data recorded  Have You Been Recently Discharged From Any Office Practice or Programs? No data recorded Explanation of Discharge From Practice/Program: No data recorded    CCA Screening Triage Referral Assessment Type of Contact: No data recorded Telemedicine Service Delivery:   Is this Initial or Reassessment? No data recorded Date Telepsych consult ordered in [facility]:  No data recorded Time Telepsych consult ordered in [facility]:  No data recorded Location of Assessment: No data recorded Provider Location: No data recorded  Collateral Involvement: No data recorded  Does Patient Have a [place]? No data recorded Legal Guardian Contact Information: No data recorded Copy of Legal Guardianship Form: No data recorded Legal Guardian Notified of Arrival: No data recorded Legal Guardian Notified of Pending Discharge: No data recorded If Minor and Not Living with Parent(s), Who has Custody? No data recorded Is CPS involved or ever been involved? No data recorded Is APS involved or ever been involved? No data recorded  Patient Determined To Be At Risk for Harm To  Self or Others Based on Review of Patient Reported Information or Presenting Complaint? No data recorded Method: No data recorded Availability of Means: No data recorded Intent: No data recorded Notification Required: No data recorded Additional Information for Danger to Others Potential: No data recorded Additional Comments for Danger to Others Potential: No data recorded Are There Guns or Other Weapons in Your Home? No data recorded Types of Guns/Weapons: No data recorded Are These Weapons Safely Secured?                            No data recorded Who Could Verify You Are Able To Have These Secured: No data recorded Do You Have any Outstanding Charges, Pending Court Dates, Parole/Probation? No data recorded Contacted To Inform of Risk of Harm To Self or Others: No data recorded   Does Patient Present under Involuntary Commitment? No data recorded IVC Papers Initial File Date: No data recorded  [place] of Residence: No data recorded  Patient Currently Receiving the Following Services: No data recorded  Determination of Need: Urgent (48 hours)   Options For Referral: Medication Management; [facility]     CCA Biopsychosocial Patient Reported Schizophrenia/Schizoaffective Diagnosis in Past: No   Strengths: Patient  has been treatment/medication compliant, she has support from husband   Mental Health Symptoms Depression:   Difficulty Concentrating; Fatigue; Hopelessness; Sleep (too much or little); Tearfulness; Worthlessness   Duration of Depressive symptoms:  Duration of Depressive Symptoms: Less than two weeks   Mania:   None   Anxiety:    Worrying; Tension   Psychosis:   Hallucinations   Duration of Psychotic symptoms:  Duration of Psychotic Symptoms: Less than six months   Trauma:  No data recorded  Obsessions:   None   Compulsions:   None   Inattention:   N/A   Hyperactivity/Impulsivity:   N/A   Oppositional/Defiant Behaviors:   N/A    Emotional Irregularity:   Mood lability   Other Mood/Personality Symptoms:  No data recorded   Mental Status Exam Appearance and self-care  Stature:   Average   Weight:   Average weight   Clothing:   Casual   Grooming:   Well-groomed   Cosmetic use:   Age appropriate   Posture/gait:   Normal   Motor activity:   Not Remarkable   Sensorium  Attention:   Distractible   Concentration:   Anxiety interferes   Orientation:   Object; Person; Place; Time   Recall/memory:   Normal   Affect and Mood  Affect:   Depressed; Anxious; Tearful   Mood:   Depressed; Anxious   Relating  Eye contact:   Normal   Facial expression:   Depressed   Attitude toward examiner:   Cooperative   Thought and Language  Speech flow:  Other (Comment) (slurred d/t crying throughout assessment)   Thought content:   Delusions; Suspicious   Preoccupation:   None   Hallucinations:   Auditory; Command (Comment)   Organization:   Disorganized   Transport planner of Knowledge:   Average   Intelligence:   Average   Abstraction:   Functional   Judgement:   Impaired   Reality Testing:   Distorted   Insight:   Gaps   Decision Making:   Vacilates   Social Functioning  Social Maturity:   Responsible   Social Judgement:   Normal   Stress  Stressors:   Illness   Coping Ability:   Overwhelmed   Skill Deficits:   Environmental health practitioner; Self-control; Interpersonal   Supports:   Family     Religion: Religion/Spirituality Are You A Religious Person?: No  Leisure/Recreation: Leisure / Recreation Do You Have Hobbies?: No  Exercise/Diet: Exercise/Diet Do You Exercise?: No Have You Gained or Lost A Significant Amount of Weight in the Past Six Months?: No Do You Follow a Special Diet?: No Do You Have Any Trouble Sleeping?: Yes Explanation of Sleeping Difficulties: difficulty getting to sleep some nights d/t hallucinations   CCA  Employment/Education Employment/Work Situation: Employment / Work Situation Employment Situation: Unemployed Has Patient ever Been in Passenger transport manager?: No  Education: Education Is Patient Currently Attending School?: No Last Grade Completed: 12 Did You Nutritional therapist?: No Did You Have An Individualized Education Program (IIEP): No Did You Have Any Difficulty At [employer]?: No Patient's Education Has Been Impacted by Current Illness: No   CCA Family/Childhood History Family and Relationship History: Family history Marital status: Married Number of Years Married:  (NA) What types of issues is patient dealing with in the relationship?: No concerns noted Does patient have children?:  (NA)  Childhood History:  Childhood History By whom was/is the patient raised?: Both parents Did patient suffer any verbal/emotional/physical/sexual abuse as a  child?: No Did patient suffer from severe childhood neglect?: No Has patient ever been sexually abused/assaulted/raped as an adolescent or adult?: No Was the patient ever a victim of a crime or a disaster?: No Witnessed domestic violence?: No Has patient been affected by domestic violence as an adult?: No  Child/Adolescent Assessment:     CCA Substance Use Alcohol/Drug Use: Alcohol / Drug Use Pain Medications: See MAR Prescriptions: See MAR Over the Counter: See MAR History of alcohol / drug use?: No history of alcohol / drug abuse                         ASAM's:  Six Dimensions of Multidimensional Assessment  Dimension 1:  Acute Intoxication and/or Withdrawal Potential:      Dimension 2:  Biomedical Conditions and Complications:      Dimension 3:  Emotional, Behavioral, or Cognitive Conditions and Complications:     Dimension 4:  Readiness to Change:     Dimension 5:  Relapse, Continued use, or Continued Problem Potential:     Dimension 6:  Recovery/Living Environment:     ASAM Severity Score:    ASAM Recommended Level  of Treatment:     Substance use Disorder (SUD)    Recommendations for Services/Supports/Treatments:    Discharge Disposition:    DSM5 Diagnoses: Patient Active Problem List   Diagnosis Date Noted   Mild aortic stenosis 01/12/2022   Debility 01/21/2021   Hypoglycemia associated with diabetes ([place]) 01/21/2021   AKI (acute kidney injury) ([place]) 01/20/2021   CKD stage G4/A3, 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 ([place]) 01/20/2021   Hyperkalemia 01/20/2021   AMS (altered mental status) 12/03/2020   Delirium due to another medical condition 12/03/2020   Psychosis ([place]) 16/09/9603   Acute diastolic CHF (congestive heart failure) ([place]) 10/26/2020   SOB (shortness of breath) 10/25/2020   Severe obesity (BMI 35.0-39.9) with comorbidity ([place]) 01/23/2019   Malignant neoplasm of upper-outer quadrant of left breast in female, estrogen receptor negative ([place]) 04/09/2018   Transient ischemic attack 02/13/2018   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   Mixed hyperlipidemia 02/06/2018   Asthma, intermittent, uncomplicated 54/08/8118   Cellulitis of neck 06/24/2017   Sialadenitis 06/24/2017   Lactic acidosis 06/24/2017   Salivary duct stone 06/24/2017   Essential hypertension    History of TIA (transient ischemic attack)    Diabetes mellitus without complication ([place])    Arthritis    History of breast cancer    H/O mastectomy    History of breast reconstruction    Heart murmur 01/13/2014   Obesity (BMI 30.0-34.9) 01/13/2014     Referrals to Alternative Service(s): Referred to Alternative Service(s):   Place:   Date:   Time:    Referred to Alternative Service(s):   Place:   Date:   Time:    Referred to Alternative Service(s):   Place:   Date:   Time:    Referred to Alternative Service(s):   Place:   Date:   Time:     Fransico Meadow, [facility]

## 2022-10-16 NOTE — Discharge Instructions (Signed)
Please contact one of the following facilities to start medication management and therapy services:  ? ?[facility] ?[address]  ?[place], [place] [address] ?[phone]  ? ?[facility]  ?[address] ?[place], [place] [address] ?[phone] ? ?[facility]  ?[address], [place], [place] [address] ?[phone] ? ?[facility]  ?[address]  ?[place], [place] [address] ?[phone] ? ?[facility]  ?[address] ?[place], [place] [address] ?[phone] ? ?[facility]  ?[address] [place] [address],  ?, [place] [address] ?[phone]  ?

## 2022-10-17 ENCOUNTER — Encounter (HOSPITAL_COMMUNITY): Payer: Self-pay | Admitting: Psychiatry

## 2022-10-17 ENCOUNTER — Ambulatory Visit (INDEPENDENT_AMBULATORY_CARE_PROVIDER_SITE_OTHER): Payer: Medicare Other | Admitting: Psychiatry

## 2022-10-17 VITALS — BP 138/86 | HR 63 | Temp 98.3°F | Ht 63.0 in

## 2022-10-17 DIAGNOSIS — F411 Generalized anxiety disorder: Secondary | ICD-10-CM | POA: Diagnosis not present

## 2022-10-17 DIAGNOSIS — F4323 Adjustment disorder with mixed anxiety and depressed mood: Secondary | ICD-10-CM

## 2022-10-17 DIAGNOSIS — F5102 Adjustment insomnia: Secondary | ICD-10-CM

## 2022-10-17 DIAGNOSIS — F251 Schizoaffective disorder, depressive type: Secondary | ICD-10-CM | POA: Diagnosis not present

## 2022-10-17 DIAGNOSIS — F333 Major depressive disorder, recurrent, severe with psychotic symptoms: Secondary | ICD-10-CM | POA: Diagnosis not present

## 2022-10-17 NOTE — Progress Notes (Signed)
Psychiatric Initial Adult Assessment   Patient Identification: Beth Blankenship MRN:  [id number] Date of Evaluation:  10/17/2022 Referral Source: Psych ED  Chief Complaint:   Chief Complaint  Patient presents with   Depression   Establish Care   Visit Diagnosis:    ICD-10-CM   1. MDD (major depressive disorder), recurrent, severe, with psychosis ([place])  F33.3     2. Schizoaffective disorder, depressive type ([place])  F25.1     3. GAD (generalized anxiety disorder)  F41.1     4. Adjustment disorder with mixed anxiety and depressed mood  F43.23       History of Present Illness:    Severe episode of recurrent major depressive disorder, with psychotic features ([place])     as per yesterday note visited in ED " History of Present illness: Beth Blankenship is a 71 y.o. female Beth Blankenship 71 y.o., female patient presented to [facility] as a walk in accompanied by her spouse with complaints of "I just want to die".   Beth Blankenship, 71 y.o., female patient seen face to face by this provider, consulted with Dr. Dwyane Dee for; and chart reviewed on 10/16/22.  Per chart review patient has a past psychiatric history of GAD and MDD with psychosis.  She is prescribed Seroquel 100 mg nightly, hydroxyzine 25 mg every 8 hours as needed, Invega 3 mg daily, sertraline 150 mg daily, BuSpar 5 mg twice daily and trazodone 100 mg nightly as needed.  She had services in place with [facility] but was dismissed from their services.  She is currently been off of her Lorayne Bender, BuSpar and Zoloft for the past week.   Patient spouse is present during the evaluation with patient's permission.  On evaluation Beth Blankenship reports over the past week she has experienced increased depression and anxiety.With feelings of helplessness, hopelessness, tearfulness, decreased appetite and sleep.    She has begun having intusive thoughts that the devil is telling her to kill herself or he will kill her.  She denies  any AVH reports these are just intrusive thoughts being put in her head.  States these thoughts have included putting Visine eyedrops in her water.  States if she does not kill herself then the devil will hurt her family.  Patient denies having any intent to act on her suicidal ideations.  States these are just thoughts but they do scare her.  She is contracting for safety and states she does not want to harm herself or anyone around her.  Her spouse states she has no intent to hurt herself and he has removed any items or medications that could possibly be used in any act to hurt herself out of patient's access.  He administers all medications.  He has removed the eyedrops as well.  He has no immediate safety concerns with patient returning home.  Reports patient was stable after being discharged from [facility] months ago.  She was doing good until she ran out of medications roughly 1 week ago.  Of note per chart review patient presented with a similar presentation at that time.  Spouse is patient's primary caregiver and assist her with ADLs.  She is in a wheelchair and can only stand with assist.  She requires continuous 2 L of oxygen.  Patient's husband is very supportive and is able to calm patient throughout the assessment.  He is requesting that medications be restarted and bridged until they are able to make an outpatient  appointment for medication management."  Yesterday contracted for safety and husband supportive, following prescriptions were sent to pharmacy Increased BuSpar to 10 mg twice daily.  Seroquel 100 mg nightly, Invega 3 mg daily, sertraline 150 mg daily, hydroxyzine 25 mg every 8 hours as needed, trazodone 100 mg nightly as needed.   On eval today, apparently they didn't pick up meds, has been off invega more then a week ago which led to worsening of depression and recurrence of delusional thoughts including devil is after her and wants her to die, she was having passive  suicidal thoughts but can distract with husband support .  She was doing better when on meds and agrees to pick it up today. Understands it would take few days and would help depression and psychosis She remains subdued but kept her appointment today endorsed decrease energy, withdrawn, intusive thoughts as above   Worries are excessive and related to future and health Sleep is fair with medications  There is no clear history of mania but husband states her mental health deteriorated since 2021 after UTI and with [facility]  Admitted in [place] with depression and psychosis. Has followed with [facility] but was discharged due to missing appointments leading to be running low on meds this week and precipitation of depression with recurrence of delusional intrusive thoughts.    Aggravating factor: medical co morbidities,   Modifying factor: husband, daughters  Duration more then 3 years  Severity depressed, hopeless but denies plan to self harm, has supportive husband who cares for her and understands safety plans and discussed  Past Psychiatric History: depression  Previous Psychotropic Medications: Yes   Substance Abuse History in the last 12 months:  No.  Consequences of Substance Abuse: NA  Past Medical History:  Past Medical History:  Diagnosis Date   Arthritis    Cancer ([place])    Diabetes mellitus without complication ([place])    H/O mastectomy    left side   History of breast reconstruction    Hypertension    Stroke [facility])    mini strokes    Past Surgical History:  Procedure Laterality Date   MASTECTOMY Left 04/29/2015   REDUCTION MAMMAPLASTY Right    SHOULDER SURGERY Right    TUBAL LIGATION      Family Psychiatric History: aunt ; depression   Family History:  Family History  Problem Relation Age of Onset   Heart disease Mother    Hypertension Mother    Stroke Mother    Heart disease Father    Diabetes Paternal Aunt    Diabetes Paternal Uncle    Cancer  Maternal Grandmother        colon    Social History:   Social History   Socioeconomic History   Marital status: Married    Spouse name: Not on file   Number of children: Not on file   Years of education: Not on file   Highest education level: Not on file  Occupational History   Not on file  Tobacco Use   Smoking status: Former   Smokeless tobacco: Never  Substance and Sexual Activity   Alcohol use: No   Drug use: No   Sexual activity: Not on file  Other Topics Concern   Not on file  Social History Narrative   Not on file   Social Determinants of Health   Financial Resource Strain: Not on file  Food Insecurity: Not on file  Transportation Needs: Not on file  Physical Activity: Not on  file  Stress: Not on file  Social Connections: Not on file    Additional Social History: grew up with siblings and father, mom left them, difficult growing up  Married 40 years  Allergies:   Allergies  Allergen Reactions   Latex Hives   Tetracyclines & Related Hives    Metabolic Disorder Labs: Lab Results  Component Value Date   HGBA1C 8.8 (H) 07/09/2017   No results found for: "PROLACTIN" No results found for: "CHOL", "TRIG", "HDL", "CHOLHDL", "VLDL", "LDLCALC" No results found for: "TSH"  Therapeutic Level Labs: No results found for: "LITHIUM" No results found for: "CBMZ" No results found for: "VALPROATE"  Current Medications: Current Outpatient Medications  Medication Sig Dispense Refill   albuterol (PROVENTIL HFA;VENTOLIN HFA) 108 (90 Base) MCG/ACT inhaler Inhale 2 puffs into the lungs every 6 (six) hours as needed for wheezing or shortness of breath.     alendronate (FOSAMAX) 70 MG tablet TAKE 1 TABLET BY MOUTH EVERY WEEK. 12 tablet 3   amLODipine (NORVASC) 10 MG tablet Take 1 tablet (10 mg total) by mouth daily. 90 tablet 2   aspirin EC 325 MG tablet Take 325 mg by mouth daily.     atorvastatin (LIPITOR) 20 MG tablet Take 1 tablet (20 mg total) by mouth daily. 90  tablet 2   busPIRone (BUSPAR) 10 MG tablet Take 1 tablet (10 mg total) by mouth 2 (two) times daily. 60 tablet 0   Calcium-Vitamin D 600-200 MG-UNIT tablet Take 1 tablet by mouth daily.     doxazosin (CARDURA) 4 MG tablet Take 1 tablet (4 mg total) by mouth 2 (two) times daily. 180 tablet 1   glipiZIDE (GLUCOTROL) 10 MG tablet Take 1 tablet (10 mg total) by mouth 2 (two) times daily before a meal. 60 tablet 2   hydrochlorothiazide (MICROZIDE) 12.5 MG capsule Take 1 capsule (12.5 mg total) by mouth daily. 90 capsule 1   hydrOXYzine (ATARAX) 25 MG tablet Take 1 tablet (25 mg total) by mouth every 8 (eight) hours as needed for anxiety. 90 tablet 0   insulin regular (HUMULIN R) 100 units/mL injection 34 units in AM and 20 units in PM 10 mL 11   letrozole (FEMARA) 2.5 MG tablet Take 1 tablet (2.5 mg total) by mouth daily. 90 tablet 0   lisinopril (PRINIVIL,ZESTRIL) 20 MG tablet Take 1 tablet (20 mg total) by mouth daily. 90 tablet 1   metFORMIN (GLUCOPHAGE) 1000 MG tablet Take 1 tablet (1,000 mg total) by mouth 2 (two) times daily with a meal. 180 tablet 2   metoprolol tartrate (LOPRESSOR) 100 MG tablet Take 1 tablet (100 mg total) by mouth 2 (two) times daily. 180 tablet 2   paliperidone (INVEGA) 3 MG 24 hr tablet Take 1 tablet (3 mg total) by mouth daily. 30 tablet 0   QUEtiapine (SEROQUEL) 100 MG tablet Take 1 tablet (100 mg total) by mouth at bedtime. 30 tablet 0   sertraline (ZOLOFT) 100 MG tablet Take 1.5 tablets (150 mg total) by mouth daily. 30 tablet 2   traZODone (DESYREL) 100 MG tablet Take 1 tablet (100 mg total) by mouth at bedtime. 30 tablet 0   No current facility-administered medications for this visit.     Psychiatric Specialty Exam: Review of Systems  Cardiovascular:  Negative for chest pain.  Neurological:  Negative for tremors.  Psychiatric/Behavioral:  Positive for decreased concentration, dysphoric mood and sleep disturbance. The patient is nervous/anxious.     Blood  pressure 138/86, pulse 63, temperature 98.3  F (36.8 C), height '5\' 3"'$  (1.6 m), SpO2 98 %.Body mass index is 35.92 kg/m.  General Appearance: Casual  Eye Contact:  Fair  Speech:  Slow  Volume:  Decreased  Mood:  Dysphoric  Affect:  Depressed  Thought Process:  Goal Directed  Orientation:  Full (Time, Place, and Person)  Thought Content:  Delusions, Hallucinations: Auditory, Paranoid Ideation, and Rumination  Suicidal Thoughts:  No  Homicidal Thoughts:  No  Memory:  Immediate;   Fair  Judgement:  Poor  Insight:  Shallow  Psychomotor Activity:  Decreased  Concentration:  Concentration: Fair  Recall:  Poor  Fund of Knowledge:Fair  Language: Fair  Akathisia:  no  Handed:    AIMS (if indicated):  no involuntary movements   Assets:  Housing Intimacy Social Support  ADL's:  Impaired, on wheelchair  Cognition: Impaired,  Mild  Sleep:   irregular without meds   Screenings: PHQ2-9    [place] Office Visit from 10/17/2022 in [place] ED from 10/16/2022 in [facility]  PHQ-2 Total Score 4 3  PHQ-9 Total Score 16 15      [place] Office Visit from 10/17/2022 in [place] ED from 10/16/2022 in [facility] ED from 02/21/2022 in [facility] Error: Q3, 4, or 5 should not be populated when Q2 is No Moderate Risk No Risk       Assessment and Plan: as follows  MDD with psychosis: non compliance for a week or non availability of invega, rule out schizoaffective disorder; prescriptions were sent yesterday they will pick ivnega as well today. Could not wait yesterday, continue zoloft for depression.  On seroquel at night for sleep and depression. Two antipsychotics have kept her balance   GAD: continue zoloft and consider therapy  to work on coping with medical comorbidities and distraction  from negative thoughts, buspar was increased yesterday to bid, she will pick today. Also on vistaril prn for anxiety, agitation  Adjustment disorder: full MDD superimposed on adjustment considering her symptoms above Insomnia: reviewed sleep hygiene, to be awake during the day and on trazadone, serqouel at night  No tremors, denies chest pain  Husband remains supportive and full time care giver and retired, she listens to him, contracts for safety and understand to call suicide hotline, contact office or other measure to help with worsening of depression or crises  Direct care time spent    60 minutes   including chart review, documentation and collaboration.  Follow up in 3 -4 weeks or earlier if needed, compliance with other appointments and medications discussed I contacted pharmacy while patient was in room and reviewed meds to be picked up today  Collaboration of Care: Other ED evaluation and referral notes reviewed  Patient/Guardian was advised Release of Information must be obtained prior to any record release in order to collaborate their care with an outside provider. Patient/Guardian was advised if they have not already done so to contact the registration department to sign all necessary forms in order for [place] to release information regarding their care.   Consent: Patient/Guardian gives verbal consent for treatment and assignment of benefits for services provided during this visit. Patient/Guardian expressed understanding and agreed to proceed.   Merian Capron, MD 10/24/20239:12 AM

## 2022-10-31 ENCOUNTER — Ambulatory Visit (INDEPENDENT_AMBULATORY_CARE_PROVIDER_SITE_OTHER): Payer: Medicare Other | Admitting: Licensed Clinical Social Worker

## 2022-10-31 DIAGNOSIS — F333 Major depressive disorder, recurrent, severe with psychotic symptoms: Secondary | ICD-10-CM

## 2022-10-31 DIAGNOSIS — F411 Generalized anxiety disorder: Secondary | ICD-10-CM

## 2022-10-31 DIAGNOSIS — F4323 Adjustment disorder with mixed anxiety and depressed mood: Secondary | ICD-10-CM

## 2022-10-31 DIAGNOSIS — F5102 Adjustment insomnia: Secondary | ICD-10-CM | POA: Diagnosis not present

## 2022-10-31 DIAGNOSIS — F251 Schizoaffective disorder, depressive type: Secondary | ICD-10-CM | POA: Diagnosis not present
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
## 2022-11-07 ENCOUNTER — Telehealth (HOSPITAL_COMMUNITY): Payer: Self-pay | Admitting: Psychiatry

## 2022-11-07 ENCOUNTER — Telehealth (HOSPITAL_COMMUNITY): Payer: Self-pay | Admitting: Licensed Clinical Social Worker

## 2022-11-07 NOTE — Telephone Encounter (Signed)
Called patient to reschedule therapy appointments 11/20/2022 and 12/05/2022. No answer and voicemail is full.

## 2022-11-07 NOTE — Telephone Encounter (Signed)
Called patient to reschedule 11/23/2022 appointment as provider will be out of the office. No answer and voicemail box is full.

## 2022-11-15 ENCOUNTER — Other Ambulatory Visit (HOSPITAL_COMMUNITY): Payer: Self-pay

## 2022-11-15 MED ORDER — PALIPERIDONE ER 3 MG PO TB24: 3.0000 mg | ORAL_TABLET | Freq: Every day | ORAL | 0 refills | Status: DC

## 2022-11-15 MED ORDER — BUSPIRONE HCL 10 MG PO TABS: 10.0000 mg | ORAL_TABLET | Freq: Two times a day (BID) | ORAL | 0 refills | Status: DC

## 2022-11-23 ENCOUNTER — Ambulatory Visit (HOSPITAL_COMMUNITY): Payer: Medicare Other | Admitting: Psychiatry

## 2022-11-30 ENCOUNTER — Encounter (HOSPITAL_COMMUNITY): Payer: Self-pay

## 2022-11-30 ENCOUNTER — Ambulatory Visit (INDEPENDENT_AMBULATORY_CARE_PROVIDER_SITE_OTHER): Payer: Medicare Other | Admitting: Licensed Clinical Social Worker

## 2022-11-30 DIAGNOSIS — F5102 Adjustment insomnia: Secondary | ICD-10-CM | POA: Diagnosis not present

## 2022-11-30 DIAGNOSIS — F251 Schizoaffective disorder, depressive type: Secondary | ICD-10-CM

## 2022-11-30 DIAGNOSIS — F411 Generalized anxiety disorder: Secondary | ICD-10-CM

## 2022-11-30 DIAGNOSIS — F333 Major depressive disorder, recurrent, severe with psychotic symptoms: Secondary | ICD-10-CM

## 2022-11-30 DIAGNOSIS — F4323 Adjustment disorder with mixed anxiety and depressed mood: Secondary | ICD-10-CM

## 2022-11-30 NOTE — Addendum Note (Signed)
Addended by: Cordella Register A on: 11/30/2022 05:00 PM   Modules accepted: Level of Service

## 2022-11-30 NOTE — Progress Notes (Addendum)
Virtual Visit via Video Note  I connected with Beth Blankenship on 11/30/22 at  4:00 PM EST by a video enabled telemedicine application and verified that I am speaking with the correct person using two identifiers.  Location: Patient: home with husband Provider: home office   I discussed the limitations of evaluation and management by telemedicine and the availability of in person appointments. The patient expressed understanding and agreed to proceed.   I discussed the assessment and treatment plan with the patient. The patient was provided an opportunity to ask questions and all were answered. The patient agreed with the plan and demonstrated an understanding of the instructions.   The patient was advised to call back or seek an in-person evaluation if the symptoms worsen or if the condition fails to improve as anticipated.  I provided 45 minutes of non-face-to-face time during this encounter.  THERAPIST PROGRESS NOTE  Session Time: 4:00 PM to 4:45 PM  Participation Level: Minimal  Behavioral Response: CasualAlertDysphoric  Type of Therapy: Family Therapy  Treatment Goals addressed: Decrease in depression, anxiety patient wants to be more in touch with reality as she is hearing and seeing things that are inaccurate, coping  ProgressTowards Goals: Initial  Interventions: CBT, Solution Focused, Strength-based, and Supportive  Summary: Beth Blankenship is a 71 y.o. female who presents with pretty good, still cry. Looking forward tor Christmas, Candace her daughter cooks. Exchange gift. Got to a movie. They haven't been able to go in awhile. Probably watch a movie home until she can ambulate better. Usually the 4 of them other daughter Levada Dy.  Therapist explored what she does daily patient watches TV.  This could see this could increase depression isolated not keeping busy helpful to have positive reinforcers to improve mood. Patient would like to go shopping. PT coming in Monday.   Therapist explored what she could do sitting there is a positive reinforcer husband says she doesn't have the motivation right now. Also complains about blurry vision. Patient said stuck in apartment.  Therapist noted setting goals as a good place to start patient said cry less.  She also came up with exercise. Patient can practice getting up and down and walking short distances.  Therapist noted this is homework also encouraged her to focus on positive such as the food she likes. .   Therapist finished paperwork for assessment reviewed treatment plan noting working on depression, anxiety also working on patient seeing and hearing things that are inaccurate, on thoughts that tell her to do negative things.  Therapist pointed out it is good to realize there to an accurate and that is a first step to help with this process.  Talked about patient and eating less noted lifting the depression will help but also husband notes show eat things she likes therapist encouraged her to go along with eating what she likes is they are good meals.  Patient gave verbal consent to complete virtually was provided education on CBT that when were depressed we do not see things and accurately we see the negatively and we do less creating a vicious cycle and that we can break the cycle both on changing her thoughts and what we do.  Began to work with patient on setting goals as a treatment strategy for including things that she enjoys feels a sense of accomplishment.  Patient is going to do exercise therapist pointing out will help her get where she wants to go meaning getting out of the apartment, also wants to  work on crying less.  Therapist noted focus on positives what we did in session also help.  Patient smiled as we talked about her husband's cheese sandwich she enjoys.  Noted thoughts are just thoughts do not tell the truth so be more cautious and leery of them further accuracy.  Therapist provided active listening open  questions supportive interventions. Suicidal/Homicidal: No  Plan: Return again in 2 weeks.2.  Look at CBT [place] look at management of psychotic symptoms that while patient  Diagnosis: Schizoaffective disorder depressed type, major depressive disorder recurrent, severe with psychotic features generalized anxiety disorder, adjustment insomnia, adjustment disorder with anxiety and depression  Collaboration of Care: Other none needed  Patient/Guardian was advised Release of Information must be obtained prior to any record release in order to collaborate their care with an outside provider. Patient/Guardian was advised if they have not already done so to contact the registration department to sign all necessary forms in order for [place] to release information regarding their care.   Consent: Patient/Guardian gives verbal consent for treatment and assignment of benefits for services provided during this visit. Patient/Guardian expressed understanding and agreed to proceed.   Cordella Register, LCSW 11/30/2022

## 2022-12-12 ENCOUNTER — Telehealth (HOSPITAL_COMMUNITY): Payer: Self-pay | Admitting: *Deleted

## 2022-12-12 MED ORDER — PALIPERIDONE ER 3 MG PO TB24: 3.0000 mg | ORAL_TABLET | Freq: Every day | ORAL | 0 refills | Status: DC

## 2022-12-12 NOTE — Telephone Encounter (Signed)
Patient have an appt on Dec 14 2022

## 2022-12-12 NOTE — Telephone Encounter (Signed)
[facility] off [address] in [place] requesting refills for patient Beth Blankenship '3mg'$ .   Medication was last sent to pharmacy 11/15/2022.   Patient last visit was 10/17/2022.   Patient next visit is 12/14/2022.

## 2022-12-12 NOTE — Addendum Note (Signed)
Addended by: Merian Capron on: 12/12/2022 11:23 AM   Modules accepted: Orders

## 2022-12-14 ENCOUNTER — Encounter (HOSPITAL_COMMUNITY): Payer: Self-pay | Admitting: Psychiatry

## 2022-12-14 ENCOUNTER — Telehealth (INDEPENDENT_AMBULATORY_CARE_PROVIDER_SITE_OTHER): Payer: Medicare Other | Admitting: Psychiatry

## 2022-12-14 DIAGNOSIS — F251 Schizoaffective disorder, depressive type: Secondary | ICD-10-CM | POA: Diagnosis not present

## 2022-12-14 DIAGNOSIS — F411 Generalized anxiety disorder: Secondary | ICD-10-CM | POA: Diagnosis not present

## 2022-12-14 DIAGNOSIS — F5102 Adjustment insomnia: Secondary | ICD-10-CM

## 2022-12-14 DIAGNOSIS — F333 Major depressive disorder, recurrent, severe with psychotic symptoms: Secondary | ICD-10-CM

## 2022-12-14 MED ORDER — TRAZODONE HCL 50 MG PO TABS: 50.0000 mg | ORAL_TABLET | Freq: Every day | ORAL | 1 refills | Status: DC

## 2022-12-14 MED ORDER — SERTRALINE HCL 100 MG PO TABS: 150.0000 mg | ORAL_TABLET | Freq: Every day | ORAL | 2 refills | Status: DC

## 2022-12-14 MED ORDER — QUETIAPINE FUMARATE 100 MG PO TABS: 100.0000 mg | ORAL_TABLET | Freq: Every day | ORAL | 0 refills | Status: DC

## 2022-12-14 MED ORDER — BUSPIRONE HCL 10 MG PO TABS: 10.0000 mg | ORAL_TABLET | Freq: Two times a day (BID) | ORAL | 0 refills | Status: DC

## 2022-12-14 NOTE — Progress Notes (Signed)
[place] Follow up visit  Patient Identification: Beth Blankenship MRN:  [id number] Date of Evaluation:  12/14/2022 Referral Source: Psych ED  Chief Complaint:   No chief complaint on file. Follow up depression Visit Diagnosis:  No diagnosis found.   History of Present Illness:    Severe episode of recurrent major depressive disorder, with psychotic features ([place])     Beth Blankenship, 71 y.o., female patient  has a past psychiatric history of GAD and MDD with psychosis.    She has had history of having intusive thoughts that the devil is telling her to kill herself or he will kill her.  .  Her spouse states she has no intent to hurt herself and he has removed any items or medications that could possibly be used in any act to hurt herself out of patient's access.  He administers all medications.  Spouse is patient's primary caregiver and assist her with ADLs.  She is in a wheelchair and can only stand with assist.  She requires continuous 2 L of oxygen.  Patient's husband is very supportive and is able to calm patient throughout the assessment.     Last visit outpatietn compliance was discussed as they have not had picked her meds before. Now taking invega, seroquel, zoloft, trazadone Endorses feel down, cognitively slow to respond and has history of mini strokes, gets someone doing physical rehab at home Husband remains sopportive, she is taking meds but remain flat, mostly to herself or watches tv,  Still feel devil talks to her and worries that it may harm family, husband says she is not talking about that delusion as much as she has been before  Also has medical co morbidities, diabetes, also on oxygen  History as given by  husband states her mental health deteriorated since 2021 after UTI and with [facility]  Admitted in [place] with depression and psychosis. Has followed with [facility] but was discharged due to missing appointments leading to be running low on meds this week and  precipitation of depression with recurrence of delusional intrusive thoughts.    Aggravating factor: medical co morbidities,   Modifying factor: husband, daughters  Duration 3 plus years  Severity flat affect  Past Psychiatric History: depression  Previous Psychotropic Medications: Yes   Substance Abuse History in the last 12 months:  No.  Consequences of Substance Abuse: NA  Past Medical History:  Past Medical History:  Diagnosis Date   Arthritis    Cancer ([place])    Diabetes mellitus without complication ([place])    H/O mastectomy    left side   History of breast reconstruction    Hypertension    Stroke [facility])    mini strokes    Past Surgical History:  Procedure Laterality Date   MASTECTOMY Left 04/29/2015   REDUCTION MAMMAPLASTY Right    SHOULDER SURGERY Right    TUBAL LIGATION      Family Psychiatric History: aunt ; depression   Family History:  Family History  Problem Relation Age of Onset   Heart disease Mother    Hypertension Mother    Stroke Mother    Heart disease Father    Diabetes Paternal Aunt    Diabetes Paternal Uncle    Cancer Maternal Grandmother        colon    Social History:   Social History   Socioeconomic History   Marital status: Married    Spouse name: Not on file   Number of children: Not on file  Years of education: Not on file   Highest education level: Not on file  Occupational History   Not on file  Tobacco Use   Smoking status: Former   Smokeless tobacco: Never  Substance and Sexual Activity   Alcohol use: No   Drug use: No   Sexual activity: Not on file  Other Topics Concern   Not on file  Social History Narrative   Not on file   Social Determinants of Health   Financial Resource Strain: Not on file  Food Insecurity: Not on file  Transportation Needs: Not on file  Physical Activity: Not on file  Stress: Not on file  Social Connections: Not on file    Allergies:   Allergies  Allergen Reactions   Latex  Hives   Tetracyclines & Related Hives    Metabolic Disorder Labs: Lab Results  Component Value Date   HGBA1C 8.8 (H) 07/09/2017   No results found for: "PROLACTIN" No results found for: "CHOL", "TRIG", "HDL", "CHOLHDL", "VLDL", "LDLCALC" No results found for: "TSH"  Therapeutic Level Labs: No results found for: "LITHIUM" No results found for: "CBMZ" No results found for: "VALPROATE"  Current Medications: Current Outpatient Medications  Medication Sig Dispense Refill   albuterol (PROVENTIL HFA;VENTOLIN HFA) 108 (90 Base) MCG/ACT inhaler Inhale 2 puffs into the lungs every 6 (six) hours as needed for wheezing or shortness of breath.     alendronate (FOSAMAX) 70 MG tablet TAKE 1 TABLET BY MOUTH EVERY WEEK. 12 tablet 3   amLODipine (NORVASC) 10 MG tablet Take 1 tablet (10 mg total) by mouth daily. 90 tablet 2   aspirin EC 325 MG tablet Take 325 mg by mouth daily.     atorvastatin (LIPITOR) 20 MG tablet Take 1 tablet (20 mg total) by mouth daily. 90 tablet 2   busPIRone (BUSPAR) 10 MG tablet Take 1 tablet (10 mg total) by mouth 2 (two) times daily. 60 tablet 0   Calcium-Vitamin D 600-200 MG-UNIT tablet Take 1 tablet by mouth daily.     doxazosin (CARDURA) 4 MG tablet Take 1 tablet (4 mg total) by mouth 2 (two) times daily. 180 tablet 1   glipiZIDE (GLUCOTROL) 10 MG tablet Take 1 tablet (10 mg total) by mouth 2 (two) times daily before a meal. 60 tablet 2   hydrochlorothiazide (MICROZIDE) 12.5 MG capsule Take 1 capsule (12.5 mg total) by mouth daily. 90 capsule 1   hydrOXYzine (ATARAX) 25 MG tablet Take 1 tablet (25 mg total) by mouth every 8 (eight) hours as needed for anxiety. 90 tablet 0   insulin regular (HUMULIN R) 100 units/mL injection 34 units in AM and 20 units in PM 10 mL 11   letrozole (FEMARA) 2.5 MG tablet Take 1 tablet (2.5 mg total) by mouth daily. 90 tablet 0   lisinopril (PRINIVIL,ZESTRIL) 20 MG tablet Take 1 tablet (20 mg total) by mouth daily. 90 tablet 1   metFORMIN  (GLUCOPHAGE) 1000 MG tablet Take 1 tablet (1,000 mg total) by mouth 2 (two) times daily with a meal. 180 tablet 2   metoprolol tartrate (LOPRESSOR) 100 MG tablet Take 1 tablet (100 mg total) by mouth 2 (two) times daily. 180 tablet 2   paliperidone (INVEGA) 3 MG 24 hr tablet Take 1 tablet (3 mg total) by mouth daily. 30 tablet 0   QUEtiapine (SEROQUEL) 100 MG tablet Take 1 tablet (100 mg total) by mouth at bedtime. 30 tablet 0   sertraline (ZOLOFT) 100 MG tablet Take 1.5 tablets (150 mg  total) by mouth daily. 30 tablet 2   traZODone (DESYREL) 50 MG tablet Take 1 tablet (50 mg total) by mouth at bedtime. 30 tablet 1   No current facility-administered medications for this visit.     Psychiatric Specialty Exam: Review of Systems  Neurological:  Negative for tremors.  Psychiatric/Behavioral:  Positive for decreased concentration and sleep disturbance. The patient is nervous/anxious.     There were no vitals taken for this visit.There is no height or weight on file to calculate BMI.  General Appearance: Casual  Eye Contact:  Fair  Speech:  Slow  Volume:  Decreased  Mood: flat affect , subdued or tired  Affect: constircted  Thought Process:  Goal Directed  Orientation:  Full (Time, Place, and Person)  Thought Content:  Delusions, Hallucinations: Auditory, Paranoid Ideation, and Rumination  Suicidal Thoughts:  No  Homicidal Thoughts:  No  Memory:  Immediate;   Fair  Judgement:  Poor  Insight:  Shallow  Psychomotor Activity:  Decreased  Concentration:  Concentration: Fair  Recall:  Poor  Fund of Knowledge:Fair  Language: Fair  Akathisia:  no  Handed:    AIMS (if indicated):  no involuntary movements   Assets:  Housing Intimacy Social Support  ADL's:  Impaired, on wheelchair  Cognition: Impaired,  Mild  Sleep:   irregular without meds   Screenings: Camera operator Row Counselor from 10/31/2022 in [place] Office Visit from  10/17/2022 in [place] ED from 10/16/2022 in [facility]  PHQ-2 Total Score '6 4 3  '$ PHQ-9 Total Score '16 16 15      '$ Flowsheet Row Video Visit from 12/14/2022 in [place] Counselor from 10/31/2022 in [place] Office Visit from 10/17/2022 in [place] Error: Q3, 4, or 5 should not be populated when Q2 is No Error: Question 1 not populated Error: Q3, 4, or 5 should not be populated when Q2 is No       Assessment and Plan: as follows  Prior documentation reviewed MDD with psychosis: sleeps at night and still sleepy or takes naps during the day, will lower trazadone to '50mg'$ , most other meds they dont want to change as it has kept some balance, will continue invega '3mg'$ . Serouquel '100mg'$  Zoloft '150mg'$    GAD: regular worries, does not elaborate , continue zoloft, buspar, seldom takes vistaril  Adjustment disorder:  discussed to continue with PCP, Cardiology and referral for neurology to rule out cognitive slowing secondary to mini strokes as per history Insomnia: reviewed sleep hygiene avoid naps dueing day, lower trazadone to '50mg'$  continue serouquel  Fu 4 - 6 weeks , continue follow up with pcp and providers, husband remains supportive   Collaboration of Care: Other ED evaluation and referral notes reviewed  Patient/Guardian was advised Release of Information must be obtained prior to any record release in order to collaborate their care with an outside provider. Patient/Guardian was advised if they have not already done so to contact the registration department to sign all necessary forms in order for [place] to release information regarding their care.   Consent: Patient/Guardian gives verbal consent for treatment and assignment of benefits for services provided during this visit.  Patient/Guardian expressed understanding and agreed to proceed.   Merian Capron, MD 12/21/20232:50 PM

## 2022-12-21 ENCOUNTER — Encounter (HOSPITAL_COMMUNITY): Payer: Self-pay

## 2022-12-21 ENCOUNTER — Ambulatory Visit (HOSPITAL_COMMUNITY): Payer: Medicare Other | Admitting: Licensed Clinical Social Worker

## 2022-12-21 NOTE — Progress Notes (Signed)
Therapist contacted patient through My Chart and she did not show

## 2023-01-10 ENCOUNTER — Other Ambulatory Visit (HOSPITAL_COMMUNITY): Payer: Self-pay

## 2023-01-10 MED ORDER — PALIPERIDONE ER 3 MG PO TB24: 3.0000 mg | ORAL_TABLET | Freq: Every day | ORAL | 0 refills | Status: DC

## 2023-01-16 ENCOUNTER — Telehealth (HOSPITAL_COMMUNITY): Payer: Self-pay | Admitting: *Deleted

## 2023-01-16 MED ORDER — QUETIAPINE FUMARATE 100 MG PO TABS: 100.0000 mg | ORAL_TABLET | Freq: Every day | ORAL | 0 refills | Status: DC

## 2023-01-16 MED ORDER — BUSPIRONE HCL 10 MG PO TABS: 10.0000 mg | ORAL_TABLET | Freq: Two times a day (BID) | ORAL | 0 refills | Status: DC

## 2023-01-16 NOTE — Addendum Note (Signed)
Addended by: Merian Capron on: 01/16/2023 09:49 AM   Modules accepted: Orders

## 2023-01-16 NOTE — Telephone Encounter (Signed)
Rx REFILL REQUEST --  [id number] - [place], [place] - [address] AT [address]   busPIRone (BUSPAR) 10 MG tablet  QUEtiapine (SEROQUEL) 100 MG tablet   NEXT APPT   NOT MADE LAST APPT    12/14/22

## 2023-01-30 ENCOUNTER — Telehealth (HOSPITAL_COMMUNITY): Payer: Medicare Other | Admitting: Psychiatry

## 2023-01-30 ENCOUNTER — Encounter (HOSPITAL_COMMUNITY): Payer: Self-pay

## 2023-02-19 ENCOUNTER — Telehealth (HOSPITAL_COMMUNITY): Payer: Self-pay

## 2023-02-19 MED ORDER — TRAZODONE HCL 50 MG PO TABS: 50.0000 mg | ORAL_TABLET | Freq: Every day | ORAL | 0 refills | Status: DC

## 2023-02-19 MED ORDER — QUETIAPINE FUMARATE 100 MG PO TABS: 100.0000 mg | ORAL_TABLET | Freq: Every day | ORAL | 0 refills | Status: DC

## 2023-02-19 NOTE — Telephone Encounter (Signed)
Medication refills - Faxes received from patient's [facility] for new Trazodone and Quetiapine orders, last provided 01/16/23. Patient no showed 01/30/23 and has not rescheduled at this time.

## 2023-02-20 ENCOUNTER — Telehealth (INDEPENDENT_AMBULATORY_CARE_PROVIDER_SITE_OTHER): Payer: Medicare Other | Admitting: Psychiatry

## 2023-02-20 ENCOUNTER — Encounter (HOSPITAL_COMMUNITY): Payer: Self-pay | Admitting: Psychiatry

## 2023-02-20 DIAGNOSIS — F5102 Adjustment insomnia: Secondary | ICD-10-CM | POA: Diagnosis not present

## 2023-02-20 DIAGNOSIS — F411 Generalized anxiety disorder: Secondary | ICD-10-CM

## 2023-02-20 DIAGNOSIS — F251 Schizoaffective disorder, depressive type: Secondary | ICD-10-CM

## 2023-02-20 DIAGNOSIS — F333 Major depressive disorder, recurrent, severe with psychotic symptoms: Secondary | ICD-10-CM

## 2023-02-20 MED ORDER — QUETIAPINE FUMARATE 100 MG PO TABS: 100.0000 mg | ORAL_TABLET | Freq: Every day | ORAL | 1 refills | Status: DC

## 2023-02-20 MED ORDER — TRAZODONE HCL 50 MG PO TABS: 50.0000 mg | ORAL_TABLET | Freq: Every day | ORAL | 0 refills | Status: DC

## 2023-02-20 MED ORDER — SERTRALINE HCL 100 MG PO TABS: 150.0000 mg | ORAL_TABLET | Freq: Every day | ORAL | 2 refills | Status: DC

## 2023-02-20 MED ORDER — PALIPERIDONE ER 3 MG PO TB24: 3.0000 mg | ORAL_TABLET | Freq: Every day | ORAL | 1 refills | Status: DC

## 2023-02-20 MED ORDER — BUSPIRONE HCL 10 MG PO TABS: 10.0000 mg | ORAL_TABLET | Freq: Two times a day (BID) | ORAL | 1 refills | Status: DC

## 2023-02-20 NOTE — Progress Notes (Signed)
[place] Follow up visit  Patient Identification: Beth Blankenship MRN:  [id number] Date of Evaluation:  02/20/2023 Referral Source: Psych ED  Chief Complaint:   No chief complaint on file. Follow up depression Visit Diagnosis:    ICD-10-CM   1. Schizoaffective disorder, depressive type ([place])  F25.1     2. MDD (major depressive disorder), recurrent, severe, with psychosis ([place])  F33.3     3. GAD (generalized anxiety disorder)  F41.1     4. Adjustment insomnia  F51.02       Virtual Visit via Video Note  I connected with Beth Blankenship on 02/20/23 at  3:30 PM EST by a video enabled telemedicine application and verified that I am speaking with the correct person using two identifiers.  Location: Patient: home with husband Provider: office   I discussed the limitations of evaluation and management by telemedicine and the availability of in person appointments. The patient expressed understanding and agreed to proceed.    I discussed the assessment and treatment plan with the patient. The patient was provided an opportunity to ask questions and all were answered. The patient agreed with the plan and demonstrated an understanding of the instructions.   The patient was advised to call back or seek an in-person evaluation if the symptoms worsen or if the condition fails to improve as anticipated.  I provided 20 minutes of non-face-to-face time during this encounter.   History of Present Illness:    Severe episode of recurrent major depressive disorder, with psychotic features ([place])     [name], 72 y.o., female patient  has a past psychiatric history of GAD and MDD with psychosis.    She has had history of having intusive thoughts that the devil is telling her to kill herself or he will kill her.  .  Her spouse states she has no intent to hurt herself and he has removed any items or medications that could possibly be used in any act to hurt herself out of patient's  access.  He administers all medications.  Spouse is patient's primary caregiver and assist her with ADLs.  She is in a wheelchair and can only stand with assist.  She requires continuous 2 L of oxygen.  Patient's husband is very supportive and is able to calm patient throughout the assessment.     Last visit discussed compliance now taking meds, but medical co morbidies keep her limited Sleep is irregular so discussed to keep balance and not over take sleep aid   Still feel devil is out to get the family more so when she is depressed She is on two different anti psychotics   Also has medical co morbidities, diabetes, also on oxygen  History as given by  husband states her mental health deteriorated since 2021 after UTI and with [facility]   Aggravating factor: medical co morbidities,   Modifying factor: husband, daughters  Duration 3 plus years  Severity  baseline,   Past Psychiatric History: depression  Previous Psychotropic Medications: Yes   Substance Abuse History in the last 12 months:  No.  Consequences of Substance Abuse: NA  Past Medical History:  Past Medical History:  Diagnosis Date   Arthritis    Cancer ([place])    Diabetes mellitus without complication ([place])    H/O mastectomy    left side   History of breast reconstruction    Hypertension    Stroke [facility])    mini strokes    Past Surgical History:  Procedure Laterality Date   MASTECTOMY Left 04/29/2015   REDUCTION MAMMAPLASTY Right    SHOULDER SURGERY Right    TUBAL LIGATION      Family Psychiatric History: aunt ; depression   Family History:  Family History  Problem Relation Age of Onset   Heart disease Mother    Hypertension Mother    Stroke Mother    Heart disease Father    Diabetes Paternal Aunt    Diabetes Paternal Uncle    Cancer Maternal Grandmother        colon    Social History:   Social History   Socioeconomic History   Marital status: Married    Spouse name: Not on file   Number of  children: Not on file   Years of education: Not on file   Highest education level: Not on file  Occupational History   Not on file  Tobacco Use   Smoking status: Former   Smokeless tobacco: Never  Substance and Sexual Activity   Alcohol use: No   Drug use: No   Sexual activity: Not on file  Other Topics Concern   Not on file  Social History Narrative   Not on file   Social Determinants of Health   Financial Resource Strain: Not on file  Food Insecurity: Not on file  Transportation Needs: Not on file  Physical Activity: Not on file  Stress: Not on file  Social Connections: Not on file    Allergies:   Allergies  Allergen Reactions   Latex Hives   Tetracyclines & Related Hives    Metabolic Disorder Labs: Lab Results  Component Value Date   HGBA1C 8.8 (H) 07/09/2017   No results found for: "PROLACTIN" No results found for: "CHOL", "TRIG", "HDL", "CHOLHDL", "VLDL", "LDLCALC" No results found for: "TSH"  Therapeutic Level Labs: No results found for: "LITHIUM" No results found for: "CBMZ" No results found for: "VALPROATE"  Current Medications: Current Outpatient Medications  Medication Sig Dispense Refill   albuterol (PROVENTIL HFA;VENTOLIN HFA) 108 (90 Base) MCG/ACT inhaler Inhale 2 puffs into the lungs every 6 (six) hours as needed for wheezing or shortness of breath.     alendronate (FOSAMAX) 70 MG tablet TAKE 1 TABLET BY MOUTH EVERY WEEK. 12 tablet 3   amLODipine (NORVASC) 10 MG tablet Take 1 tablet (10 mg total) by mouth daily. 90 tablet 2   aspirin EC 325 MG tablet Take 325 mg by mouth daily.     atorvastatin (LIPITOR) 20 MG tablet Take 1 tablet (20 mg total) by mouth daily. 90 tablet 2   busPIRone (BUSPAR) 10 MG tablet Take 1 tablet (10 mg total) by mouth 2 (two) times daily. 60 tablet 1   Calcium-Vitamin D 600-200 MG-UNIT tablet Take 1 tablet by mouth daily.     doxazosin (CARDURA) 4 MG tablet Take 1 tablet (4 mg total) by mouth 2 (two) times daily. 180  tablet 1   glipiZIDE (GLUCOTROL) 10 MG tablet Take 1 tablet (10 mg total) by mouth 2 (two) times daily before a meal. 60 tablet 2   hydrochlorothiazide (MICROZIDE) 12.5 MG capsule Take 1 capsule (12.5 mg total) by mouth daily. 90 capsule 1   hydrOXYzine (ATARAX) 25 MG tablet Take 1 tablet (25 mg total) by mouth every 8 (eight) hours as needed for anxiety. 90 tablet 0   insulin regular (HUMULIN R) 100 units/mL injection 34 units in AM and 20 units in PM 10 mL 11   letrozole (FEMARA) 2.5 MG tablet Take  1 tablet (2.5 mg total) by mouth daily. 90 tablet 0   lisinopril (PRINIVIL,ZESTRIL) 20 MG tablet Take 1 tablet (20 mg total) by mouth daily. 90 tablet 1   metFORMIN (GLUCOPHAGE) 1000 MG tablet Take 1 tablet (1,000 mg total) by mouth 2 (two) times daily with a meal. 180 tablet 2   metoprolol tartrate (LOPRESSOR) 100 MG tablet Take 1 tablet (100 mg total) by mouth 2 (two) times daily. 180 tablet 2   paliperidone (INVEGA) 3 MG 24 hr tablet Take 1 tablet (3 mg total) by mouth daily. 30 tablet 1   QUEtiapine (SEROQUEL) 100 MG tablet Take 1 tablet (100 mg total) by mouth at bedtime. 30 tablet 1   sertraline (ZOLOFT) 100 MG tablet Take 1.5 tablets (150 mg total) by mouth daily. 30 tablet 2   traZODone (DESYREL) 50 MG tablet Take 1 tablet (50 mg total) by mouth at bedtime. 30 tablet 0   No current facility-administered medications for this visit.     Psychiatric Specialty Exam: Review of Systems  Neurological:  Negative for tremors.  Psychiatric/Behavioral:  Positive for decreased concentration and sleep disturbance. The patient is nervous/anxious.     There were no vitals taken for this visit.There is no height or weight on file to calculate BMI.  General Appearance: Casual  Eye Contact:  Fair  Speech:  Slow  Volume:  Decreased  Mood: flat affect , subdued or tired  Affect: constircted  Thought Process:  Goal Directed  Orientation:  Full (Time, Place, and Person)  Thought Content:  Delusions,  Hallucinations: Auditory, Paranoid Ideation, and Rumination  Suicidal Thoughts:  No  Homicidal Thoughts:  No  Memory:  Immediate;   Fair  Judgement:  Poor  Insight:  Shallow  Psychomotor Activity:  Decreased  Concentration:  Concentration: Fair  Recall:  Poor  Fund of Knowledge:Fair  Language: Fair  Akathisia:  no  Handed:    AIMS (if indicated):  no involuntary movements   Assets:  Housing Intimacy Social Support  ADL's:  Impaired, on wheelchair  Cognition: Impaired,  Mild  Sleep:   irregular without meds   Screenings: Web designer from 10/31/2022 in [place] at [place] Visit from 10/17/2022 in [place] [facility] ED from 10/16/2022 in [facility]  PHQ-2 Total Score '6 4 3  '$ PHQ-9 Total Score '16 16 15      '$ Flowsheet Row Video Visit from 12/14/2022 in [place] at [place] from 10/31/2022 in [place] at [place] Visit from 10/17/2022 in [place] at [place] Error: Q3, 4, or 5 should not be populated when Q2 is No Error: Question 1 not populated Error: Q3, 4, or 5 should not be populated when Q2 is No       Assessment and Plan: as follows Prior documentation reviewed MDD with psychosis: irregular sleep can cause confusion, reviewed sleep hygiene, would not add more meds, continue invega, seroquel and zoloft more so she needs ongoing support from family and activities  Not suicidal  GAD: fluctuates, not worse, continue buspar, zoloft   Adjustment disorder:  discussed to continue with PCP, Cardiology and referral for neurology to rule out cognitive slowing secondary to mini strokes as per history Insomnia: reveiwed sleep hygiene, not increase trazadone, avoid  bed lying during the day May consider sleep study  Fu 56m husband remains supportive   Collaboration of Care: Other ED evaluation and referral notes reviewed  Patient/Guardian was advised Release of Information must be obtained prior to any record release in order to collaborate their care with an outside provider. Patient/Guardian was advised if they have not already done so to contact the registration department to sign all necessary forms in order for [place] release information regarding their care.   Consent: Patient/Guardian gives verbal consent for treatment and assignment of benefits for services provided during this visit. Patient/Guardian expressed understanding and agreed to proceed.   NMerian Capron MD 2/27/20243:46 PM

## 2023-02-23 ENCOUNTER — Ambulatory Visit (INDEPENDENT_AMBULATORY_CARE_PROVIDER_SITE_OTHER): Payer: Medicare Other | Admitting: Licensed Clinical Social Worker

## 2023-02-23 DIAGNOSIS — F4323 Adjustment disorder with mixed anxiety and depressed mood: Secondary | ICD-10-CM | POA: Diagnosis not present

## 2023-02-23 DIAGNOSIS — F411 Generalized anxiety disorder: Secondary | ICD-10-CM

## 2023-02-23 DIAGNOSIS — F333 Major depressive disorder, recurrent, severe with psychotic symptoms: Secondary | ICD-10-CM | POA: Diagnosis not present

## 2023-02-23 DIAGNOSIS — F251 Schizoaffective disorder, depressive type: Secondary | ICD-10-CM | POA: Diagnosis not present

## 2023-02-23 DIAGNOSIS — F5102 Adjustment insomnia: Secondary | ICD-10-CM

## 2023-02-23 NOTE — Progress Notes (Signed)
Virtual Visit via Video Note  I connected with Beth Blankenship on 02/23/23 at 10:00 AM EST by a video enabled telemedicine application and verified that I am speaking with the correct person using two identifiers.  Location: Patient: with husband at home Provider: home office   I discussed the limitations of evaluation and management by telemedicine and the availability of in person appointments. The patient expressed understanding and agreed to proceed.   I discussed the assessment and treatment plan with the patient. The patient was provided an opportunity to ask questions and all were answered. The patient agreed with the plan and demonstrated an understanding of the instructions.   The patient was advised to call back or seek an in-person evaluation if the symptoms worsen or if the condition fails to improve as anticipated.  I provided  50 minutes of non-face-to-face time during this encounter.  THERAPIST PROGRESS NOTE  Session Time: 10:00 AM to 10:50 AM  Participation Level: Active  Behavioral Response: CasualAlertslowed and subdued  Type of Therapy: Family Therapy  Treatment Goals addressed: Decrease in depression, anxiety patient wants to be more in touch with reality as she is hearing and seeing things that are inaccurate, coping  ProgressTowards Goals: Progressing-educated patient on how thoughts cause feelings for her to track thoughts that lead to depression  Interventions: CBT, Solution Focused, Strength-based, Supportive, and Other: coping  Summary: Beth Blankenship is a 72 y.o. female who presents with pretty good husband said could be doing better but stable. Patient says depression is better. Lately been playing solitare. With her walker and husband said with assistance walked her a little bit while supported her.  Therapist noted this is progress as last time we set the goal of getting up and down.  Another goal was to cry less and she is doing that as well. Had  a cardiology appointment when went up and down the stairs she was afraid but she did it. Intrusive thought not really bad. Mood better less intense therapist noting these thoughts are mood related and patient agrees.  Reviewed what we want to work on today patient said mood therapist will send her handout for psychotic symptoms.  Therapist spent session explaining more about CBT concepts see bellow. .   Therapist reviewed symptoms noted symptoms have improved positively reinforced this continue to encourage patient to engage in positively reinforcing activities noted positive that she is able to move around a little bit more that is progress since last session as well as progress with better mood.  Therapist connected intrusive thoughts mood driven noting as her mood is improve the symptoms have decreased.  Therapist worked with patient on CBT T strategies for mood Therapist explaining the basic concepts of CBT that thoughts cause feelings.  You have an event but it your interpretation of the event that causes the feeling.  So the strategy is for patient to track her thoughts when she has a negative mood.  Noted some of the qualities of automatic thoughts so patient better able to do that reviewed things like they are spontaneous, often appear in short hand are almost always believed they are often couch to in terms of should auto must they tend to awful eyes they are relatively idiosyncratic they are persistent and self perpetuating they often differ from the persons public statements they repeat certain things they are learned.  When person has some mood issues to be because they have tunnel vision or only paying attention to some of the  environment for someone who is depressed they may only focused on the past or their own feelings and flaws.  Discussed patient tracking this and noting we are looking for distortions in thinking is often thoughts are not telling us the truth.  Noted helpful sometimes to review  his situation and get the feeling will help tracking the thought also we want to stretch out 1 or 2 words to the entire thought to better understand the thought we are telling ourselves.  We will continue with CBT work for patient Suicidal/Homicidal: No  Plan: Return again in 3 weeks.2.  Patient track negative thoughts, look at handout on psychotic symptoms sent to her continue with thoughts and feelings workbook  Diagnosis: Schizoaffective disorder depressed type, major depressive disorder recurrent, severe with psychotic features generalized anxiety disorder, adjustment insomnia, adjustment disorder with anxiety and depression  Collaboration of Care: Medication Management AEB to our last note  Patient/Guardian was advised Release of Information must be obtained prior to any record release in order to collaborate their care with an outside provider. Patient/Guardian was advised if they have not already done so to contact the registration department to sign all necessary forms in order for [place] to release information regarding their care.   Consent: Patient/Guardian gives verbal consent for treatment and assignment of benefits for services provided during this visit. Patient/Guardian expressed understanding and agreed to proceed.   Cordella Register, LCSW 02/23/2023

## 2023-02-27 ENCOUNTER — Other Ambulatory Visit (HOSPITAL_COMMUNITY): Payer: Self-pay | Admitting: Psychiatry

## 2023-03-13 ENCOUNTER — Ambulatory Visit (INDEPENDENT_AMBULATORY_CARE_PROVIDER_SITE_OTHER): Payer: Medicare Other | Admitting: Licensed Clinical Social Worker

## 2023-03-13 ENCOUNTER — Encounter (HOSPITAL_COMMUNITY): Payer: Self-pay

## 2023-03-13 DIAGNOSIS — F5102 Adjustment insomnia: Secondary | ICD-10-CM

## 2023-03-13 DIAGNOSIS — F4323 Adjustment disorder with mixed anxiety and depressed mood: Secondary | ICD-10-CM

## 2023-03-13 DIAGNOSIS — F411 Generalized anxiety disorder: Secondary | ICD-10-CM

## 2023-03-13 DIAGNOSIS — F251 Schizoaffective disorder, depressive type: Secondary | ICD-10-CM

## 2023-03-13 DIAGNOSIS — F333 Major depressive disorder, recurrent, severe with psychotic symptoms: Secondary | ICD-10-CM

## 2023-03-13 NOTE — Progress Notes (Signed)
Therapist contacted patient through My chart and she did not respond

## 2023-03-22 ENCOUNTER — Encounter (HOSPITAL_COMMUNITY): Payer: Self-pay | Admitting: Licensed Clinical Social Worker

## 2023-03-22 ENCOUNTER — Ambulatory Visit (INDEPENDENT_AMBULATORY_CARE_PROVIDER_SITE_OTHER): Payer: Medicare Other | Admitting: Licensed Clinical Social Worker

## 2023-03-22 ENCOUNTER — Encounter (HOSPITAL_COMMUNITY): Payer: Self-pay

## 2023-03-22 DIAGNOSIS — F411 Generalized anxiety disorder: Secondary | ICD-10-CM

## 2023-03-22 DIAGNOSIS — F251 Schizoaffective disorder, depressive type: Secondary | ICD-10-CM

## 2023-03-22 NOTE — Progress Notes (Signed)
Therapist contacted patient through My Chart and she did not show 

## 2023-03-29 ENCOUNTER — Telehealth (HOSPITAL_COMMUNITY): Payer: Self-pay | Admitting: *Deleted

## 2023-03-29 MED ORDER — TRAZODONE HCL 50 MG PO TABS: 50.0000 mg | ORAL_TABLET | Freq: Every day | ORAL | 0 refills | Status: DC

## 2023-03-29 MED ORDER — QUETIAPINE FUMARATE 100 MG PO TABS: 100.0000 mg | ORAL_TABLET | Freq: Every day | ORAL | 1 refills | Status: DC

## 2023-03-29 NOTE — Addendum Note (Signed)
Addended by: Merian Capron on: 03/29/2023 10:26 AM   Modules accepted: Orders

## 2023-03-29 NOTE — Addendum Note (Signed)
Addended by: Merian Capron on: 03/29/2023 10:25 AM   Modules accepted: Orders

## 2023-03-29 NOTE — Telephone Encounter (Signed)
Patient pharmacy [facility] off of [address] in [place] requesting refills for patient Trazodone 50mg  QHS.     Medication is Trazodone 50mg  QHS  Medication was last filled on 02/20/2023 with 0 refills and 30 tablets  Patient last appt was 02/20/2023  Patient follow up appt is 06/05/2023

## 2023-03-29 NOTE — Telephone Encounter (Signed)
Patient pharmacy [facility] off of [address] in [place] requesting refills for patient Quetiapine 100mg  QHS.      Medication requesting is Quetiapine 100mg  QHS with 1 refill 30tablets  Medication last filled on 02/20/2023  Patient last appt was 02/20/2023  Patient next appt is 06/05/2023  Pharmacy requesting refill is [facility] off of [address] in [place]

## 2023-04-11 ENCOUNTER — Ambulatory Visit (HOSPITAL_COMMUNITY): Payer: Medicare Other | Admitting: Licensed Clinical Social Worker

## 2023-05-09 ENCOUNTER — Encounter (HOSPITAL_COMMUNITY): Payer: Self-pay | Admitting: Psychiatry

## 2023-05-09 ENCOUNTER — Telehealth (INDEPENDENT_AMBULATORY_CARE_PROVIDER_SITE_OTHER): Payer: Medicare Other | Admitting: Psychiatry

## 2023-05-09 DIAGNOSIS — F251 Schizoaffective disorder, depressive type: Secondary | ICD-10-CM | POA: Diagnosis not present

## 2023-05-09 DIAGNOSIS — F411 Generalized anxiety disorder: Secondary | ICD-10-CM | POA: Diagnosis not present

## 2023-05-09 DIAGNOSIS — F333 Major depressive disorder, recurrent, severe with psychotic symptoms: Secondary | ICD-10-CM

## 2023-05-09 MED ORDER — PALIPERIDONE ER 3 MG PO TB24: 3.0000 mg | ORAL_TABLET | Freq: Every day | ORAL | 1 refills | Status: DC

## 2023-05-09 MED ORDER — BUSPIRONE HCL 10 MG PO TABS: 10.0000 mg | ORAL_TABLET | Freq: Two times a day (BID) | ORAL | 1 refills | Status: DC

## 2023-05-09 MED ORDER — SERTRALINE HCL 100 MG PO TABS: 150.0000 mg | ORAL_TABLET | Freq: Every day | ORAL | 2 refills | Status: DC

## 2023-05-09 MED ORDER — TRAZODONE HCL 50 MG PO TABS: 50.0000 mg | ORAL_TABLET | Freq: Every evening | ORAL | 0 refills | Status: DC | PRN

## 2023-05-09 NOTE — Progress Notes (Signed)
[facility] Follow up visit  Patient Identification: Beth Blankenship MRN:  [id number] Date of Evaluation:  05/09/2023 Referral Source: Psych ED  Chief Complaint:   No chief complaint on file. Follow up depression Visit Diagnosis:    ICD-10-CM   1. Schizoaffective disorder, depressive type (HCC)  F25.1     2. MDD (major depressive disorder), recurrent, severe, with psychosis (HCC)  F33.3     3. GAD (generalized anxiety disorder)  F41.1      Virtual Visit via Video Note  I connected with Beth Blankenship on 05/09/23 at  3:00 PM EDT by a video enabled telemedicine application and verified that I am speaking with the correct person using two identifiers.  Location: Patient: rehab place with her husband Provider: home office   I discussed the limitations of evaluation and management by telemedicine and the availability of in person appointments. The patient expressed understanding and agreed to proceed.      I discussed the assessment and treatment plan with the patient. The patient was provided an opportunity to ask questions and all were answered. The patient agreed with the plan and demonstrated an understanding of the instructions.   The patient was advised to call back or seek an in-person evaluation if the symptoms worsen or if the condition fails to improve as anticipated.  I provided 20 minutes of non-face-to-face time during this encounter.     History of Present Illness:    Severe episode of recurrent major depressive disorder, with psychotic features (HCC)     Beth Blankenship, 72 y.o., female patient  has a past psychiatric history of GAD and MDD with psychosis.    She has had history of having intusive thoughts that the devil is telling her to kill herself or he will kill her.  .  Her spouse states she has no intent to hurt herself and he has removed any items or medications that could possibly be used in any act to hurt herself out of patient's access.  He  administers all medications.  Spouse is patient's primary caregiver and assist her with ADLs.  She is in a wheelchair and can only stand with assist.  She requires continuous 2 L of oxygen.  Patient's husband is very supportive and is able to calm patient throughout the assessment.     On eval today she is in rehab for last 4 weeks after having SOB , she has [facility]  Husband supportive, she is going home and will have home health care and help Overall not worried of devil or if devil is after her Gets subdued and has limitations due to her medical comorbidities   Husband also feels meds are ok where they are we talked about cutting down trazadone during night and keeping her awake during the day to help sleep at night  She is on two different anti psychotics to balance sleep and paranoia  Also has medical co morbidities, diabetes, also on oxygen  History as given by  husband states her mental health deteriorated since 2021 after UTI and with [facility]   Aggravating factor: medical co morbidities,   Modifying factor: husband, daughters  Duration 3 plus years  Severity  baseline  Past Psychiatric History: depression  Previous Psychotropic Medications: Yes   Substance Abuse History in the last 12 months:  No.  Consequences of Substance Abuse: NA  Past Medical History:  Past Medical History:  Diagnosis Date   Arthritis    Cancer (HCC)    Diabetes  mellitus without complication (HCC)    H/O mastectomy    left side   History of breast reconstruction    Hypertension    Stroke [facility])    mini strokes    Past Surgical History:  Procedure Laterality Date   MASTECTOMY Left 04/29/2015   REDUCTION MAMMAPLASTY Right    SHOULDER SURGERY Right    TUBAL LIGATION      Family Psychiatric History: aunt ; depression   Family History:  Family History  Problem Relation Age of Onset   Heart disease Mother    Hypertension Mother    Stroke Mother    Heart disease Father    Diabetes Paternal  Aunt    Diabetes Paternal Uncle    Cancer Maternal Grandmother        colon    Social History:   Social History   Socioeconomic History   Marital status: Married    Spouse name: Not on file   Number of children: Not on file   Years of education: Not on file   Highest education level: Not on file  Occupational History   Not on file  Tobacco Use   Smoking status: Former   Smokeless tobacco: Never  Substance and Sexual Activity   Alcohol use: No   Drug use: No   Sexual activity: Not on file  Other Topics Concern   Not on file  Social History Narrative   Not on file   Social Determinants of Health   Financial Resource Strain: Not on file  Food Insecurity: Not on file  Transportation Needs: Not on file  Physical Activity: Not on file  Stress: Not on file  Social Connections: Not on file    Allergies:   Allergies  Allergen Reactions   Latex Hives   Tetracyclines & Related Hives    Metabolic Disorder Labs: Lab Results  Component Value Date   HGBA1C 8.8 (H) 07/09/2017   No results found for: "PROLACTIN" No results found for: "CHOL", "TRIG", "HDL", "CHOLHDL", "VLDL", "LDLCALC" No results found for: "TSH"  Therapeutic Level Labs: No results found for: "LITHIUM" No results found for: "CBMZ" No results found for: "VALPROATE"  Current Medications: Current Outpatient Medications  Medication Sig Dispense Refill   albuterol (PROVENTIL HFA;VENTOLIN HFA) 108 (90 Base) MCG/ACT inhaler Inhale 2 puffs into the lungs every 6 (six) hours as needed for wheezing or shortness of breath.     alendronate (FOSAMAX) 70 MG tablet TAKE 1 TABLET BY MOUTH EVERY WEEK. 12 tablet 3   amLODipine (NORVASC) 10 MG tablet Take 1 tablet (10 mg total) by mouth daily. 90 tablet 2   aspirin EC 325 MG tablet Take 325 mg by mouth daily.     atorvastatin (LIPITOR) 20 MG tablet Take 1 tablet (20 mg total) by mouth daily. 90 tablet 2   busPIRone (BUSPAR) 10 MG tablet Take 1 tablet (10 mg total) by  mouth 2 (two) times daily. 60 tablet 1   Calcium-Vitamin D 600-200 MG-UNIT tablet Take 1 tablet by mouth daily.     doxazosin (CARDURA) 4 MG tablet Take 1 tablet (4 mg total) by mouth 2 (two) times daily. 180 tablet 1   glipiZIDE (GLUCOTROL) 10 MG tablet Take 1 tablet (10 mg total) by mouth 2 (two) times daily before a meal. 60 tablet 2   hydrochlorothiazide (MICROZIDE) 12.5 MG capsule Take 1 capsule (12.5 mg total) by mouth daily. 90 capsule 1   hydrOXYzine (ATARAX) 25 MG tablet Take 1 tablet (25 mg total) by  mouth every 8 (eight) hours as needed for anxiety. 90 tablet 0   insulin regular (HUMULIN R) 100 units/mL injection 34 units in AM and 20 units in PM 10 mL 11   letrozole (FEMARA) 2.5 MG tablet Take 1 tablet (2.5 mg total) by mouth daily. 90 tablet 0   lisinopril (PRINIVIL,ZESTRIL) 20 MG tablet Take 1 tablet (20 mg total) by mouth daily. 90 tablet 1   metFORMIN (GLUCOPHAGE) 1000 MG tablet Take 1 tablet (1,000 mg total) by mouth 2 (two) times daily with a meal. 180 tablet 2   metoprolol tartrate (LOPRESSOR) 100 MG tablet Take 1 tablet (100 mg total) by mouth 2 (two) times daily. 180 tablet 2   paliperidone (INVEGA) 3 MG 24 hr tablet Take 1 tablet (3 mg total) by mouth daily. 30 tablet 1   QUEtiapine (SEROQUEL) 100 MG tablet Take 1 tablet (100 mg total) by mouth at bedtime. 30 tablet 1   sertraline (ZOLOFT) 100 MG tablet Take 1.5 tablets (150 mg total) by mouth daily. 30 tablet 2   traZODone (DESYREL) 50 MG tablet Take 1 tablet (50 mg total) by mouth at bedtime as needed for sleep. 30 tablet 0   No current facility-administered medications for this visit.     Psychiatric Specialty Exam: Review of Systems  Neurological:  Negative for tremors.  Psychiatric/Behavioral:  Positive for decreased concentration and sleep disturbance. The patient is nervous/anxious.     There were no vitals taken for this visit.There is no height or weight on file to calculate BMI.  General Appearance: Casual   Eye Contact:  Fair  Speech:  Slow  Volume:  Decreased  Mood: flat affect ,   Affect: constircted  Thought Process:  Goal Directed  Orientation:  Full (Time, Place, and Person)  Thought Content:  Delusions, Hallucinations: Auditory, Paranoid Ideation, and Rumination  Suicidal Thoughts:  No  Homicidal Thoughts:  No  Memory:  Immediate;   Fair  Judgement:  Poor  Insight:  Shallow  Psychomotor Activity:  Decreased  Concentration:  Concentration: Fair  Recall:  Poor  Fund of Knowledge:Fair  Language: Fair  Akathisia:  no  Handed:    AIMS (if indicated):  no involuntary movements   Assets:  Housing Intimacy Social Support  ADL's:  Impaired, on wheelchair  Cognition: Impaired,  Mild  Sleep:   irregular without meds   Screenings: Oceanographer Row Counselor from 10/31/2022 in [place] [facility] at [facility] Office Visit from 10/17/2022 in [place] [facility] ED from 10/16/2022 in [facility]  PHQ-2 Total Score 6 4 3   PHQ-9 Total Score 16 16 15       Flowsheet Row Video Visit from 12/14/2022 in [facility] at [facility] Counselor from 10/31/2022 in [facility] at [facility] Office Visit from 10/17/2022 in [facility] at [facility]  C-SSRS RISK CATEGORY Error: Q3, 4, or 5 should not be populated when Q2 is No Error: Question 1 not populated Error: Q3, 4, or 5 should not be populated when Q2 is No       Assessment and Plan: as follows  Prior documentation reviewed  MDD with psychosis:schizoaffective disorder: possible. Irregular sleep can cause confusion so trying to keep awake during the day  Continue invega and seroquel, reviewed meds and side effects   GAD: fluctuates, contijnue buspar and zoloft  Adjustment disorder:  discussed to  continue with PCP, Cardiology. Medical conditions can effect her functionality and mood  Insomnia: reviewed sleep hygiene, avoid naps during day, lower trazadone to prn at night  Fu 79m.    Collaboration of Care: Other ED evaluation and referral notes reviewed  Patient/Guardian was advised Release of Information must be obtained prior to any record release in order to collaborate their care with an outside provider. Patient/Guardian was advised if they have not already done so to contact the registration department to sign all necessary forms in order for [place] to release information regarding their care.   Consent: Patient/Guardian gives verbal consent for treatment and assignment of benefits for services provided during this visit. Patient/Guardian expressed understanding and agreed to proceed.   Thresa Ross, MD 5/15/20243:11 PM

## 2023-06-05 ENCOUNTER — Telehealth (HOSPITAL_COMMUNITY): Payer: Medicare Other | Admitting: Psychiatry

## 2023-06-25 ENCOUNTER — Telehealth (HOSPITAL_COMMUNITY): Payer: Self-pay | Admitting: *Deleted

## 2023-06-25 MED ORDER — QUETIAPINE FUMARATE 100 MG PO TABS: 100.0000 mg | ORAL_TABLET | Freq: Every day | ORAL | 0 refills | Status: DC

## 2023-06-25 NOTE — Telephone Encounter (Signed)
[id number] - [place], [place] - [address]   QUEtiapine (SEROQUEL) 100 MG tablet [[id number]]   Order Details  Dose: 100 mg Route: Oral Frequency: Daily at bedtime  Dispense Quantity: 30 tablet Refills: 1   Indications of Use: Major Depressive Disorder       Sig: Take 1 tablet (100 mg total) by mouth at bedtime.       Last appt  05/09/23 Next appt  07/11/23

## 2023-06-25 NOTE — Addendum Note (Signed)
Addended by: Thresa Ross on: 06/25/2023 08:55 AM   Modules accepted: Orders

## 2023-07-11 ENCOUNTER — Encounter (HOSPITAL_COMMUNITY): Payer: Self-pay

## 2023-07-11 ENCOUNTER — Telehealth (HOSPITAL_COMMUNITY): Payer: Medicare Other | Admitting: Psychiatry

## 2023-07-23 ENCOUNTER — Encounter (HOSPITAL_COMMUNITY): Payer: Self-pay

## 2023-07-23 NOTE — Telephone Encounter (Signed)
Opened in error

## 2023-08-07 ENCOUNTER — Telehealth (HOSPITAL_COMMUNITY): Payer: Self-pay | Admitting: *Deleted

## 2023-08-07 NOTE — Telephone Encounter (Signed)
Rx Refill Request-- [id number] - [place], [place] - [address]   sertraline (ZOLOFT) 100 MG tablet 30 tablet 2 05/09/2023 05/08/2024   Sig - Route: Take 1.5 tablets (150 mg total)  by mouth daily. - Oral     Last Seen -- 05/09/23 Cancel -- 06/05/23 No Show -- 07/11/23

## 2023-08-17 ENCOUNTER — Telehealth (HOSPITAL_COMMUNITY): Payer: Self-pay | Admitting: *Deleted

## 2023-08-17 ENCOUNTER — Other Ambulatory Visit (HOSPITAL_COMMUNITY): Payer: Self-pay | Admitting: *Deleted

## 2023-08-17 MED ORDER — QUETIAPINE FUMARATE 100 MG PO TABS: 100.0000 mg | ORAL_TABLET | Freq: Every day | ORAL | 0 refills | Status: DC

## 2023-08-17 NOTE — Telephone Encounter (Signed)
Attempted Call VM stated Voice Mail Box is Full

## 2023-08-17 NOTE — Telephone Encounter (Signed)
Rx Refill Request -- QUEtiapine (SEROQUEL) 100 MG tablet   [id number] - [place], [place] - [address] AT [facility] [address]   Last appt  05/09/23 Next appt  07/11/23

## 2023-08-17 NOTE — Telephone Encounter (Signed)
REFILL SENT SIGN OFF

## 2023-09-04 ENCOUNTER — Telehealth (HOSPITAL_COMMUNITY): Payer: Medicare Other | Admitting: Psychiatry

## 2023-09-04 ENCOUNTER — Telehealth (HOSPITAL_COMMUNITY): Payer: Self-pay | Admitting: Psychiatry

## 2023-09-04 NOTE — Telephone Encounter (Signed)
Provider is out sick today. Called to notify and reschedule. No answer and VM box is full. No answer by spouse either.

## 2023-09-19 ENCOUNTER — Telehealth (HOSPITAL_COMMUNITY): Payer: Medicare Other | Admitting: Psychiatry

## 2023-09-19 ENCOUNTER — Encounter (HOSPITAL_COMMUNITY): Payer: Self-pay | Admitting: Psychiatry

## 2023-09-19 DIAGNOSIS — F5102 Adjustment insomnia: Secondary | ICD-10-CM

## 2023-09-19 DIAGNOSIS — F411 Generalized anxiety disorder: Secondary | ICD-10-CM

## 2023-09-19 DIAGNOSIS — F333 Major depressive disorder, recurrent, severe with psychotic symptoms: Secondary | ICD-10-CM

## 2023-09-19 DIAGNOSIS — F251 Schizoaffective disorder, depressive type: Secondary | ICD-10-CM | POA: Diagnosis not present

## 2023-09-19 MED ORDER — TRAZODONE HCL 50 MG PO TABS: 50.0000 mg | ORAL_TABLET | Freq: Every evening | ORAL | 2 refills | Status: DC | PRN

## 2023-09-19 MED ORDER — PALIPERIDONE ER 3 MG PO TB24: 3.0000 mg | ORAL_TABLET | Freq: Every day | ORAL | 2 refills | Status: DC

## 2023-09-19 MED ORDER — QUETIAPINE FUMARATE 100 MG PO TABS: 100.0000 mg | ORAL_TABLET | Freq: Every day | ORAL | 2 refills | Status: DC

## 2023-09-19 MED ORDER — BUSPIRONE HCL 10 MG PO TABS: 10.0000 mg | ORAL_TABLET | Freq: Two times a day (BID) | ORAL | 2 refills | Status: DC

## 2023-09-19 MED ORDER — HYDROXYZINE HCL 10 MG PO TABS
10.0000 mg | ORAL_TABLET | Freq: Every day | ORAL | 0 refills | Status: DC | PRN
Start: 2023-09-19 — End: 2023-12-31

## 2023-09-19 NOTE — Progress Notes (Signed)
[facility] Follow up visit  Patient Identification: Beth Blankenship MRN:  [id number] Date of Evaluation:  09/19/2023 Referral Source: Psych ED  Chief Complaint:   No chief complaint on file. Follow up depression Visit Diagnosis:    ICD-10-CM   1. Schizoaffective disorder, depressive type (HCC)  F25.1     2. MDD (major depressive disorder), recurrent, severe, with psychosis (HCC)  F33.3     3. Adjustment insomnia  F51.02     4. GAD (generalized anxiety disorder)  F41.1      Virtual Visit via Video Note  I connected with Beth Blankenship on 09/19/23 at  3:00 PM EDT by a video enabled telemedicine application and verified that I am speaking with the correct person using two identifiers.  Location: Patient: home with husband Provider: home office   I discussed the limitations of evaluation and management by telemedicine and the availability of in person appointments. The patient expressed understanding and agreed to proceed.     I discussed the assessment and treatment plan with the patient. The patient was provided an opportunity to ask questions and all were answered. The patient agreed with the plan and demonstrated an understanding of the instructions.   The patient was advised to call back or seek an in-person evaluation if the symptoms worsen or if the condition fails to improve as anticipated.  I provided 20 minutes of non-face-to-face time during this encounter.    History of Present Illness:    Severe episode of recurrent major depressive disorder, with psychotic features (HCC)     Beth Blankenship, 72 y.o., female patient  has a past psychiatric history of GAD and MDD with psychosis.    She has had history of having intusive thoughts that the devil is telling her to kill herself or he will kill her.  .    She is in a wheelchair and can only stand with assist.  She requires continuous 2 L of oxygen.  Patient's husband is very supportive and is able to help  communicate with her.  She has been in rehab fin past as well with SOB , she has [facility] Now she has home health care  On eval doing fair mood wise, did smile feels paranoia at night as someone in the closet so keeps light on Does not endorse of devil and related fear   States to have some twitches at night,  Has not taken vistaril for a while or ran out   Seroquel, trazadone helps sleep During day feels better and not paranoid   Has  limitations due to her medical comorbidities    She is on two different anti psychotics to balance sleep and paranoia History as given by  husband states her mental health deteriorated since 2021 after UTI and with [facility]   Aggravating factor: medical co morbidities,   Modifying factor: husband, daughters  Duration 3 plus years  Severity  baseline  Past Psychiatric History: depression  Previous Psychotropic Medications: Yes   Substance Abuse History in the last 12 months:  No.  Consequences of Substance Abuse: NA  Past Medical History:  Past Medical History:  Diagnosis Date   Arthritis    Cancer (HCC)    Diabetes mellitus without complication (HCC)    H/O mastectomy    left side   History of breast reconstruction    Hypertension    Stroke (HCC)    mini strokes    Past Surgical History:  Procedure Laterality Date   MASTECTOMY  Left 04/29/2015   REDUCTION MAMMAPLASTY Right    SHOULDER SURGERY Right    TUBAL LIGATION      Family Psychiatric History: aunt ; depression   Family History:  Family History  Problem Relation Age of Onset   Heart disease Mother    Hypertension Mother    Stroke Mother    Heart disease Father    Diabetes Paternal Aunt    Diabetes Paternal Uncle    Cancer Maternal Grandmother        colon    Social History:   Social History   Socioeconomic History   Marital status: Married    Spouse name: Not on file   Number of children: Not on file   Years of education: Not on file   Highest education  level: Not on file  Occupational History   Not on file  Tobacco Use   Smoking status: Former   Smokeless tobacco: Never  Substance and Sexual Activity   Alcohol use: No   Drug use: No   Sexual activity: Not on file  Other Topics Concern   Not on file  Social History Narrative   Not on file   Social Determinants of Health   Financial Resource Strain: Low Risk  (04/18/2023)   Received from [facility]   Overall Financial Resource Strain (CARDIA)    Difficulty of Paying Living Expenses: Not hard at all  Recent Concern: Financial Resource Strain - Medium Risk (03/18/2023)   Received from [facility]   Overall Financial Resource Strain (CARDIA)    Difficulty of Paying Living Expenses: Somewhat hard  Food Insecurity: No Food Insecurity (08/19/2023)   Received from [facility]   Hunger Vital Sign    Worried About Running Out of Food in the Last Year: Never true    Ran Out of Food in the Last Year: Never true  Transportation Needs: No Transportation Needs (08/25/2023)   Received from [facility] - Transportation    Lack of Transportation (Medical): No    Lack of Transportation (Non-Medical): No  Physical Activity: Unknown (08/09/2022)   Received from [facility]   Exercise Vital Sign    Days of Exercise per Week: 0 days    Minutes of Exercise per Session: Not on file  Recent Concern: Physical Activity - Inactive (08/09/2022)   Received from [facility]   Exercise Vital Sign    Days of Exercise per Week: 0 days    Minutes of Exercise per Session: 0 min  Stress: No Stress Concern Present (08/19/2023)   Received from [facility] - Occupational Stress Questionnaire    Feeling of Stress : Not at all  Social Connections: Unknown (08/10/2023)   Received from [facility]   Social Network    Social Network: Not on file    Allergies:   Allergies  Allergen Reactions   Latex Hives    Tetracyclines & Related Hives    Metabolic Disorder Labs: Lab Results  Component Value Date   HGBA1C 8.8 (H) 07/09/2017   No results found for: "PROLACTIN" No results found for: "CHOL", "TRIG", "HDL", "CHOLHDL", "VLDL", "LDLCALC" No results found for: "TSH"  Therapeutic Level Labs: No results found for: "LITHIUM" No results found for: "CBMZ" No results found for: "VALPROATE"  Current Medications: Current Outpatient Medications  Medication Sig Dispense Refill   albuterol (PROVENTIL HFA;VENTOLIN HFA) 108 (90 Base) MCG/ACT inhaler Inhale 2 puffs into the lungs every  6 (six) hours as needed for wheezing or shortness of breath.     alendronate (FOSAMAX) 70 MG tablet TAKE 1 TABLET BY MOUTH EVERY WEEK. 12 tablet 3   amLODipine (NORVASC) 10 MG tablet Take 1 tablet (10 mg total) by mouth daily. 90 tablet 2   aspirin EC 325 MG tablet Take 325 mg by mouth daily.     atorvastatin (LIPITOR) 20 MG tablet Take 1 tablet (20 mg total) by mouth daily. 90 tablet 2   busPIRone (BUSPAR) 10 MG tablet Take 1 tablet (10 mg total) by mouth 2 (two) times daily. 60 tablet 2   Calcium-Vitamin D 600-200 MG-UNIT tablet Take 1 tablet by mouth daily.     doxazosin (CARDURA) 4 MG tablet Take 1 tablet (4 mg total) by mouth 2 (two) times daily. 180 tablet 1   glipiZIDE (GLUCOTROL) 10 MG tablet Take 1 tablet (10 mg total) by mouth 2 (two) times daily before a meal. 60 tablet 2   hydrochlorothiazide (MICROZIDE) 12.5 MG capsule Take 1 capsule (12.5 mg total) by mouth daily. 90 capsule 1   hydrOXYzine (ATARAX) 10 MG tablet Take 1 tablet (10 mg total) by mouth daily as needed for anxiety. 30 tablet 0   insulin regular (HUMULIN R) 100 units/mL injection 34 units in AM and 20 units in PM 10 mL 11   letrozole (FEMARA) 2.5 MG tablet Take 1 tablet (2.5 mg total) by mouth daily. 90 tablet 0   lisinopril (PRINIVIL,ZESTRIL) 20 MG tablet Take 1 tablet (20 mg total) by mouth daily. 90 tablet 1   metFORMIN (GLUCOPHAGE) 1000 MG  tablet Take 1 tablet (1,000 mg total) by mouth 2 (two) times daily with a meal. 180 tablet 2   metoprolol tartrate (LOPRESSOR) 100 MG tablet Take 1 tablet (100 mg total) by mouth 2 (two) times daily. 180 tablet 2   paliperidone (INVEGA) 3 MG 24 hr tablet Take 1 tablet (3 mg total) by mouth daily. 30 tablet 2   QUEtiapine (SEROQUEL) 100 MG tablet Take 1 tablet (100 mg total) by mouth at bedtime. 30 tablet 2   sertraline (ZOLOFT) 100 MG tablet Take 1.5 tablets (150 mg total) by mouth daily. 30 tablet 2   traZODone (DESYREL) 50 MG tablet Take 1 tablet (50 mg total) by mouth at bedtime as needed for sleep. 30 tablet 2   No current facility-administered medications for this visit.     Psychiatric Specialty Exam: Review of Systems  Psychiatric/Behavioral:  Positive for decreased concentration and sleep disturbance. The patient is nervous/anxious.     There were no vitals taken for this visit.There is no height or weight on file to calculate BMI.  General Appearance: Casual  Eye Contact:  Fair  Speech:  Slow  Volume:  Decreased  Mood: flat affect ,   Affect: constircted  Thought Process:  Goal Directed  Orientation:  Full (Time, Place, and Person)  Thought Content:  Delusions, Hallucinations: Auditory, Paranoid Ideation, and Rumination  Suicidal Thoughts:  No  Homicidal Thoughts:  No  Memory:  Immediate;   Fair  Judgement:  Poor  Insight:  Shallow  Psychomotor Activity:  Decreased  Concentration:  Concentration: Fair  Recall:  Poor  Fund of Knowledge:Fair  Language: Fair  Akathisia:  no  Handed:    AIMS (if indicated):  no involuntary movements   Assets:  Housing Intimacy Social Support  ADL's:  Impaired, on wheelchair  Cognition: Impaired,  Mild  Sleep:   irregular without meds   Screenings: PHQ2-9  Flowsheet Row Counselor from 10/31/2022 in [facility] at [facility] Office Visit from 10/17/2022 in [facility] Out[facility] Behavioral  [facility] at [facility] ED from 10/16/2022 in [facility]  PHQ-2 Total Score 6 4 3   PHQ-9 Total Score 16 16 15       Flowsheet Row Video Visit from 12/14/2022 in [facility] Out[facility] at [facility] Counselor from 10/31/2022 in [facility] Out[facility] Behavior[facility] at [facility] Office Visit from 10/17/2022 in [facility] at [facility]  C-SSRS RISK CATEGORY Error: Q3, 4, or 5 should not be populated when Q2 is No Error: Question 1 not populated Error: Q3, 4, or 5 should not be populated when Q2 is No       Assessment and Plan: as follows  Prior documentation reviewed   MDD with psychosis:schizoaffective disorder: possible.  Paranoia controlled with meds so prefer not to change atleast doing better during the day Restart vistaril for EPS and anxiety small dose but avoid if sedation    GAD: fluctuates, continue buspar and zoloft, add some simple activities to distract from worries   Adjustment disorder:  discussed to continue with PCP, Cardiology. Medical conditions can effect her functionality and mood  Insomnia: reviewed sleep hygiene, avoid naps during the day as no activity can lead to that  Taking seroquel,trazadone at night  Fu 48m.    Collaboration of Care: Other ED evaluation and referral notes reviewed  Patient/Guardian was advised Release of Information must be obtained prior to any record release in order to collaborate their care with an outside provider. Patient/Guardian was advised if they have not already done so to contact the registration department to sign all necessary forms in order for [place] to release information regarding their care.   Consent: Patient/Guardian gives verbal consent for treatment and assignment of benefits for services provided during this visit. Patient/Guardian expressed understanding and agreed to proceed.   Thresa Ross,  MD 9/25/20243:15 PM

## 2023-09-24 ENCOUNTER — Telehealth (HOSPITAL_COMMUNITY): Payer: Self-pay | Admitting: *Deleted

## 2023-09-24 MED ORDER — PALIPERIDONE ER 3 MG PO TB24: 3.0000 mg | ORAL_TABLET | Freq: Every day | ORAL | 1 refills | Status: DC

## 2023-09-24 NOTE — Telephone Encounter (Signed)
Rx Refill Request-- [facility] #[id number] - [place], [place] - [address] AT [facility] [address]   paliperidone (INVEGA) 3 MG 24 hr tablet   Next Appt 01/02/24 Last Appt 09/12/23

## 2023-09-24 NOTE — Addendum Note (Signed)
Addended by: Thresa Ross on: 09/24/2023 09:11 AM   Modules accepted: Orders

## 2023-10-04 ENCOUNTER — Telehealth (HOSPITAL_COMMUNITY): Payer: Self-pay | Admitting: *Deleted

## 2023-10-04 NOTE — Telephone Encounter (Signed)
[facility] -- PALLIATIVE CARE PROGRAM  CALLED TO INFORM THAT DURING ASSESSMENT THAT PATIENT WAS VERY TEARFUL, EMOTIONAL, & STATED SHE'S DEPRESSED

## 2023-10-05 ENCOUNTER — Telehealth (HOSPITAL_COMMUNITY): Payer: Self-pay | Admitting: *Deleted

## 2023-10-05 NOTE — Telephone Encounter (Signed)
LVM -- REQUESTING A RETURN CALL ** ONCE CALLED BACK INFORM THAT PROVIDER HAS STATED  :: She needs to find another therapist to avoid crises and work on coping skills. Meds can only keep some balance . She can increase zoloft from 100mg  (one and half tablet ) to 200mg  ( two tablets of 100)

## 2023-11-01 ENCOUNTER — Telehealth (HOSPITAL_COMMUNITY): Payer: Self-pay | Admitting: *Deleted

## 2023-11-01 NOTE — Telephone Encounter (Signed)
Rx refill Request [facility] #[id number] - [place], [place] - [address] AT [facility] OF [address]   sertraline (ZOLOFT) 100 MG tablet   Next Appt   01/02/24 Last Appt  09/19/23

## 2023-11-13 ENCOUNTER — Telehealth (HOSPITAL_COMMUNITY): Payer: Self-pay | Admitting: *Deleted

## 2023-11-13 MED ORDER — PALIPERIDONE ER 3 MG PO TB24: 3.0000 mg | ORAL_TABLET | Freq: Every day | ORAL | 0 refills | Status: DC

## 2023-11-13 MED ORDER — SERTRALINE HCL 100 MG PO TABS: 150.0000 mg | ORAL_TABLET | Freq: Every day | ORAL | 0 refills | Status: DC

## 2023-11-13 NOTE — Telephone Encounter (Signed)
SORRY --IT'S  THE -- Sertraline (ZOLOFT) 100 MG tablet  TAKE 1.5 MG DAILY  DISP # 45  TO  = 1.5MG 

## 2023-11-13 NOTE — Telephone Encounter (Signed)
Also Needed paliperidone (INVEGA) 3 MG 24 hr tablet

## 2023-11-13 NOTE — Telephone Encounter (Signed)
PATIENT REFILL REQUEST  [id number] - [place], [place] - [address] AT [facility] [address]    paliperidone (INVEGA) 3 MG 24 hr tablet   paliperidone (INVEGA) 3 MG 24 hr tablet **Take 1.5 tablets DISP #45 TO = 1.5 tablets

## 2023-11-13 NOTE — Addendum Note (Signed)
Addended by: Thresa Ross on: 11/13/2023 10:15 AM   Modules accepted: Orders

## 2023-12-01 ENCOUNTER — Other Ambulatory Visit (HOSPITAL_COMMUNITY): Payer: Self-pay | Admitting: Psychiatry

## 2023-12-22 ENCOUNTER — Other Ambulatory Visit (HOSPITAL_COMMUNITY): Payer: Self-pay | Admitting: Psychiatry

## 2023-12-25 ENCOUNTER — Other Ambulatory Visit (HOSPITAL_COMMUNITY): Payer: Self-pay | Admitting: Psychiatry

## 2023-12-31 ENCOUNTER — Telehealth (HOSPITAL_COMMUNITY): Payer: Self-pay | Admitting: Psychiatry

## 2023-12-31 MED ORDER — HYDROXYZINE HCL 10 MG PO TABS
10.0000 mg | ORAL_TABLET | Freq: Every day | ORAL | 0 refills | Status: DC | PRN
Start: 2023-12-31 — End: 2024-02-05

## 2023-12-31 MED ORDER — BUSPIRONE HCL 10 MG PO TABS: 10.0000 mg | ORAL_TABLET | Freq: Two times a day (BID) | ORAL | 0 refills | Status: DC

## 2023-12-31 NOTE — Telephone Encounter (Signed)
 Pt needs refill on buspar, zoloft, hydroxyzine  [facility] on [address]   Next Visit -1/8 Last Visit -9/25

## 2023-12-31 NOTE — Telephone Encounter (Signed)
 I did ask about waiting until the appointment. Husband said she was out.

## 2024-01-02 ENCOUNTER — Telehealth (HOSPITAL_COMMUNITY): Payer: Medicare Other | Admitting: Psychiatry

## 2024-02-05 ENCOUNTER — Telehealth (INDEPENDENT_AMBULATORY_CARE_PROVIDER_SITE_OTHER): Payer: Medicare Other | Admitting: Psychiatry

## 2024-02-05 ENCOUNTER — Encounter (HOSPITAL_COMMUNITY): Payer: Self-pay | Admitting: Psychiatry

## 2024-02-05 DIAGNOSIS — F251 Schizoaffective disorder, depressive type: Secondary | ICD-10-CM

## 2024-02-05 DIAGNOSIS — F5102 Adjustment insomnia: Secondary | ICD-10-CM

## 2024-02-05 DIAGNOSIS — F411 Generalized anxiety disorder: Secondary | ICD-10-CM | POA: Diagnosis not present

## 2024-02-05 DIAGNOSIS — F333 Major depressive disorder, recurrent, severe with psychotic symptoms: Secondary | ICD-10-CM

## 2024-02-05 MED ORDER — PALIPERIDONE ER 3 MG PO TB24
3.0000 mg | ORAL_TABLET | Freq: Every day | ORAL | 1 refills | Status: DC
Start: 2024-02-05 — End: 2024-04-07

## 2024-02-05 MED ORDER — BUSPIRONE HCL 10 MG PO TABS: 10.0000 mg | ORAL_TABLET | Freq: Two times a day (BID) | ORAL | 1 refills | Status: DC

## 2024-02-05 MED ORDER — SERTRALINE HCL 100 MG PO TABS: 150.0000 mg | ORAL_TABLET | Freq: Every day | ORAL | 1 refills | Status: DC

## 2024-02-05 MED ORDER — QUETIAPINE FUMARATE 100 MG PO TABS: 100.0000 mg | ORAL_TABLET | Freq: Every day | ORAL | 1 refills | Status: DC

## 2024-02-05 MED ORDER — HYDROXYZINE HCL 10 MG PO TABS: 10.0000 mg | ORAL_TABLET | Freq: Every day | ORAL | 1 refills | Status: DC | PRN

## 2024-02-05 MED ORDER — TRAZODONE HCL 50 MG PO TABS: 50.0000 mg | ORAL_TABLET | Freq: Every evening | ORAL | 2 refills | Status: DC | PRN

## 2024-02-05 NOTE — Progress Notes (Signed)
 [facility] Follow up visit  Patient Identification: Beth Blankenship MRN:  [id number] Date of Evaluation:  02/05/2024 Referral Source: Psych ED  Chief Complaint:   No chief complaint on file. Follow up depression Visit Diagnosis:    ICD-10-CM   1. Schizoaffective disorder, depressive type (HCC)  F25.1     2. MDD (major depressive disorder), recurrent, severe, with psychosis (HCC)  F33.3     3. Adjustment insomnia  F51.02     4. GAD (generalized anxiety disorder)  F41.1      Virtual Visit via Video Note  I connected with Marilla Boddy on 02/05/24 at  3:30 PM EST by a video enabled telemedicine application and verified that I am speaking with the correct person using two identifiers.  Location: Patient: home with husband Provider: office   I discussed the limitations of evaluation and management by telemedicine and the availability of in person appointments. The patient expressed understanding and agreed to proceed.    I discussed the assessment and treatment plan with the patient. The patient was provided an opportunity to ask questions and all were answered. The patient agreed with the plan and demonstrated an understanding of the instructions.   The patient was advised to call back or seek an in-person evaluation if the symptoms worsen or if the condition fails to improve as anticipated.  I provided 20 minutes of non-face-to-face time during this encounter.  0    History of Present Illness:    Severe episode of recurrent major depressive disorder, with psychotic features (HCC)     Beth Blankenship, 73 y.o., female patient  has a past psychiatric history of GAD and MDD with psychosis.    She has had history of having intusive thoughts that the devil is telling her to kill herself or he will kill her.  .    She uses a walker or wheelchair and can only stand with assist.  She requires continuous 2 L of oxygen.  Patient's husband is very supportive and is able to help  communicate with her. Has  limitations due to her medical comorbidities  She is on two different anti psychotics to balance sleep and paranoia History as given by  husband states her mental health deteriorated since 2021 after UTI and with [facility]  She has been in rehab fin past as well with SOB , she has [facility] Now she has home health care  On eval doing fair, gets anxious but is on meds, I reviewed buspar, vistaril and apparently was taking 100mg  zoloft not 150mg  Husband is supportive Less concern of devil but keeps the light on at night and has paranoia   Seroquel, trazadone helps sleep   Aggravating factor: medical co morbidities,   Modifying factors: husband, daughters  Duration 3 plus years  Severity  baseline  Past Psychiatric History: depression  Previous Psychotropic Medications: Yes   Substance Abuse History in the last 12 months:  No.  Consequences of Substance Abuse: NA  Past Medical History:  Past Medical History:  Diagnosis Date   Arthritis    Cancer (HCC)    Diabetes mellitus without complication (HCC)    H/O mastectomy    left side   History of breast reconstruction    Hypertension    Stroke [facility])    mini strokes    Past Surgical History:  Procedure Laterality Date   MASTECTOMY Left 04/29/2015   REDUCTION MAMMAPLASTY Right    SHOULDER SURGERY Right    TUBAL LIGATION  Family Psychiatric History: aunt ; depression   Family History:  Family History  Problem Relation Age of Onset   Heart disease Mother    Hypertension Mother    Stroke Mother    Heart disease Father    Diabetes Paternal Aunt    Diabetes Paternal Uncle    Cancer Maternal Grandmother        colon    Social History:   Social History   Socioeconomic History   Marital status: Married    Spouse name: Not on file   Number of children: Not on file   Years of education: Not on file   Highest education level: Not on file  Occupational History   Not on file  Tobacco Use    Smoking status: Former   Smokeless tobacco: Never  Substance and Sexual Activity   Alcohol use: No   Drug use: No   Sexual activity: Not on file  Other Topics Concern   Not on file  Social History Narrative   Not on file   Social Drivers of Health   Financial Resource Strain: Low Risk  (04/18/2023)   Received from [facility]   Overall Financial Resource Strain (CARDIA)    Difficulty of Paying Living Expenses: Not hard at all  Recent Concern: Financial Resource Strain - Medium Risk (03/18/2023)   Received from [facility]   Overall Financial Resource Strain (CARDIA)    Difficulty of Paying Living Expenses: Somewhat hard  Food Insecurity: No Food Insecurity (08/19/2023)   Received from [facility]   Hunger Vital Sign    Worried About Running Out of Food in the Last Year: Never true    Ran Out of Food in the Last Year: Never true  Transportation Needs: No Transportation Needs (08/25/2023)   Received from [facility] - Transportation    Lack of Transportation (Medical): No    Lack of Transportation (Non-Medical): No  Physical Activity: Unknown (08/09/2022)   Received from [facility]   Exercise Vital Sign    Days of Exercise per Week: 0 days    Minutes of Exercise per Session: Not on file  Recent Concern: Physical Activity - Inactive (08/09/2022)   Received from [facility]   Exercise Vital Sign    Days of Exercise per Week: 0 days    Minutes of Exercise per Session: 0 min  Stress: No Stress Concern Present (08/19/2023)   Received from [facility] - Occupational Stress Questionnaire    Feeling of Stress : Not at all  Social Connections: Unknown (08/10/2023)   Received from [facility]   Social Network    Social Network: Not on file    Allergies:   Allergies  Allergen Reactions   Latex Hives   Tetracyclines & Related Hives    Metabolic Disorder Labs: Lab Results   Component Value Date   HGBA1C 8.8 (H) 07/09/2017   No results found for: "PROLACTIN" No results found for: "CHOL", "TRIG", "HDL", "CHOLHDL", "VLDL", "LDLCALC" No results found for: "TSH"  Therapeutic Level Labs: No results found for: "LITHIUM" No results found for: "CBMZ" No results found for: "VALPROATE"  Current Medications: Current Outpatient Medications  Medication Sig Dispense Refill   albuterol (PROVENTIL HFA;VENTOLIN HFA) 108 (90 Base) MCG/ACT inhaler Inhale 2 puffs into the lungs every 6 (six) hours as needed for wheezing or shortness of breath.     alendronate (FOSAMAX) 70 MG tablet TAKE 1 TABLET  BY MOUTH EVERY WEEK. 12 tablet 3   amLODipine (NORVASC) 10 MG tablet Take 1 tablet (10 mg total) by mouth daily. 90 tablet 2   aspirin EC 325 MG tablet Take 325 mg by mouth daily.     atorvastatin (LIPITOR) 20 MG tablet Take 1 tablet (20 mg total) by mouth daily. 90 tablet 2   busPIRone (BUSPAR) 10 MG tablet Take 1 tablet (10 mg total) by mouth 2 (two) times daily. 60 tablet 1   Calcium-Vitamin D 600-200 MG-UNIT tablet Take 1 tablet by mouth daily.     doxazosin (CARDURA) 4 MG tablet Take 1 tablet (4 mg total) by mouth 2 (two) times daily. 180 tablet 1   glipiZIDE (GLUCOTROL) 10 MG tablet Take 1 tablet (10 mg total) by mouth 2 (two) times daily before a meal. 60 tablet 2   hydrochlorothiazide (MICROZIDE) 12.5 MG capsule Take 1 capsule (12.5 mg total) by mouth daily. 90 capsule 1   hydrOXYzine (ATARAX) 10 MG tablet Take 1 tablet (10 mg total) by mouth daily as needed for anxiety. 30 tablet 1   insulin regular (HUMULIN R) 100 units/mL injection 34 units in AM and 20 units in PM 10 mL 11   letrozole (FEMARA) 2.5 MG tablet Take 1 tablet (2.5 mg total) by mouth daily. 90 tablet 0   lisinopril (PRINIVIL,ZESTRIL) 20 MG tablet Take 1 tablet (20 mg total) by mouth daily. 90 tablet 1   metFORMIN (GLUCOPHAGE) 1000 MG tablet Take 1 tablet (1,000 mg total) by mouth 2 (two) times daily with a meal.  180 tablet 2   metoprolol tartrate (LOPRESSOR) 100 MG tablet Take 1 tablet (100 mg total) by mouth 2 (two) times daily. 180 tablet 2   paliperidone (INVEGA) 3 MG 24 hr tablet Take 1 tablet (3 mg total) by mouth daily. 30 tablet 1   QUEtiapine (SEROQUEL) 100 MG tablet Take 1 tablet (100 mg total) by mouth at bedtime. 30 tablet 1   sertraline (ZOLOFT) 100 MG tablet Take 1.5 tablets (150 mg total) by mouth daily. 45 tablet 1   traZODone (DESYREL) 50 MG tablet Take 1 tablet (50 mg total) by mouth at bedtime as needed for sleep. 30 tablet 2   No current facility-administered medications for this visit.     Psychiatric Specialty Exam: Review of Systems  Cardiovascular:  Negative for chest pain.  Psychiatric/Behavioral:  Positive for decreased concentration.     There were no vitals taken for this visit.There is no height or weight on file to calculate BMI.  General Appearance: Casual  Eye Contact:  Fair  Speech:  Slow  Volume:  Decreased  Mood: flat affect ,   Affect: constircted  Thought Process:  Goal Directed  Orientation:  Full (Time, Place, and Person)  Thought Content:  Delusions, Hallucinations: Auditory, Paranoid Ideation, and Rumination  Suicidal Thoughts:  No  Homicidal Thoughts:  No  Memory:  Immediate;   Fair  Judgement:  Poor  Insight:  Shallow  Psychomotor Activity:  Decreased  Concentration:  Concentration: Fair  Recall:  Poor  Fund of Knowledge:Fair  Language: Fair  Akathisia:  no  Handed:    AIMS (if indicated):  no involuntary movements   Assets:  Housing Intimacy Social Support  ADL's:  Impaired, on wheelchair  Cognition: Impaired,  Mild  Sleep:   irregular without meds   Screenings: Insurance account manager from 10/31/2022 in [facility] Behavior[facility] at [facility] Office Visit from 10/17/2022 in [facility]  Outpatient Behavioral Health at [facility] ED from 10/16/2022 in [facility]  PHQ-2 Total Score 6 4 3   PHQ-9 Total Score 16 16 15       Flowsheet Row Video Visit from 09/19/2023 in [facility] Outpatient Behavioral Health at [facility] Video Visit from 12/14/2022 in [facility] Outpatient Behavioral Health at [facility] Counselor from 10/31/2022 in [facility] Outpatient Behavioral Health at [facility]  C-SSRS RISK CATEGORY No Risk Error: Q3, 4, or 5 should not be populated when Q2 is No Error: Question 1 not populated       Assessment and Plan: as follows  Prior documentation reviewed   MDD with psychosis:schizoaffective disorder: possible.  Paranoia better unless its night time, continue seroquel at night, invega during the day  Hydroxyzine for eps or for anxiety prn   GAD: fluctuates, discussed WOrry time and then try not to dwell on anxiety. Continue zoloft but 150mg , discussed that she is on buspar also for anxiety  Adjustment disorder:  discussed to continue with PCP, Cardiology. Medical conditions can effect her functionality and mood  Insomnia:reviewed sleep hygiene, avoid naps during the day, cotninue trazadone, seroquel at night and if need vistaril Fu 3 m.    Collaboration of Care: Other ED evaluation and referral notes reviewed  Patient/Guardian was advised Release of Information must be obtained prior to any record release in order to collaborate their care with an outside provider. Patient/Guardian was advised if they have not already done so to contact the registration department to sign all necessary forms in order for [place] to release information regarding their care.   Consent: Patient/Guardian gives verbal consent for treatment and assignment of benefits for services provided during this visit. Patient/Guardian expressed understanding and agreed to proceed.   Thresa Ross, MD 2/11/20253:42 PM

## 2024-02-28 ENCOUNTER — Telehealth (HOSPITAL_COMMUNITY): Payer: Self-pay | Admitting: *Deleted

## 2024-02-28 MED ORDER — TRAZODONE HCL 50 MG PO TABS: 50.0000 mg | ORAL_TABLET | Freq: Every evening | ORAL | 1 refills | Status: DC | PRN

## 2024-02-28 NOTE — Telephone Encounter (Signed)
 Rx Refill Request [facility] #[id number] - [place], [place] - [address] AT [facility] OF [address]   traZODone (DESYREL) 50 MG tablet  Last Fill Date 01/31/24  Next Appt   05/07/24 Last Appt    01/02/24

## 2024-02-28 NOTE — Addendum Note (Signed)
 Addended by: Thresa Ross on: 02/28/2024 09:13 AM   Modules accepted: Orders

## 2024-03-19 ENCOUNTER — Telehealth (HOSPITAL_COMMUNITY): Payer: Self-pay | Admitting: *Deleted

## 2024-03-19 NOTE — Telephone Encounter (Signed)
 PALLIATIVE CARE CALLED TO SEE IF WE HAD ANY IN HOME CARE FOR THERAPY & PSYCHIATRY? DUE TO PATIENT NOT WANTING TO LEAVE THE HOUSE NOW PER HUSBAND   INFORMED WE DON'T  OFFER THAT KIND OF SERVICE-- BUT WE COULD DO VIRTUAL IF PATIENT WOULD LIKE

## 2024-03-28 ENCOUNTER — Telehealth (HOSPITAL_COMMUNITY): Payer: Self-pay | Admitting: *Deleted

## 2024-03-28 MED ORDER — BUSPIRONE HCL 10 MG PO TABS: 10.0000 mg | ORAL_TABLET | Freq: Two times a day (BID) | ORAL | 1 refills | Status: DC

## 2024-03-28 NOTE — Addendum Note (Signed)
 Addended by: Thresa Ross on: 03/28/2024 11:18 AM   Modules accepted: Orders

## 2024-03-28 NOTE — Telephone Encounter (Signed)
 Rx Reill Request [facility] #[id number] - [place], [place] - [address] AT [facility] OF [address]    busPIRone (BUSPAR) 10 MG tablet  Last Fill Date 01/31/24  Next Appt   05/07/24 Last Appt    02/05/24

## 2024-04-07 ENCOUNTER — Telehealth (HOSPITAL_COMMUNITY): Payer: Self-pay | Admitting: *Deleted

## 2024-04-07 MED ORDER — PALIPERIDONE ER 3 MG PO TB24: 3.0000 mg | ORAL_TABLET | Freq: Every day | ORAL | 1 refills | Status: DC

## 2024-04-07 NOTE — Addendum Note (Signed)
 Addended by: Wray Heady on: 04/07/2024 10:26 AM   Modules accepted: Orders

## 2024-04-07 NOTE — Telephone Encounter (Signed)
 2nd  Refill Request  paliperidone (INVEGA) 3 MG 24 hr tablet   [facility] #[id number] - [place], [place] - [address] AT [facility] [address] [facility] [facility] [address] [address]   Next Appt   05/07/24 Last Appt    02/05/24

## 2024-04-24 ENCOUNTER — Emergency Department (HOSPITAL_COMMUNITY)

## 2024-04-24 ENCOUNTER — Emergency Department (HOSPITAL_COMMUNITY)
Admission: EM | Admit: 2024-04-24 | Discharge: 2024-04-24 | Disposition: A | Discharge status: Home / Self Care | Attending: Emergency Medicine | Admitting: Emergency Medicine

## 2024-04-24 DIAGNOSIS — S065XAA Traumatic subdural hemorrhage with loss of consciousness status unknown, initial encounter: Secondary | ICD-10-CM

## 2024-04-24 DIAGNOSIS — Z91040 Latex allergy status: Secondary | ICD-10-CM | POA: Insufficient documentation

## 2024-04-24 DIAGNOSIS — Z7982 Long term (current) use of aspirin: Secondary | ICD-10-CM | POA: Insufficient documentation

## 2024-04-24 DIAGNOSIS — W06XXXA Fall from bed, initial encounter: Secondary | ICD-10-CM | POA: Insufficient documentation

## 2024-04-24 DIAGNOSIS — S0083XA Contusion of other part of head, initial encounter: Secondary | ICD-10-CM | POA: Insufficient documentation

## 2024-04-24 DIAGNOSIS — S0990XA Unspecified injury of head, initial encounter: Secondary | ICD-10-CM | POA: Diagnosis present

## 2024-04-24 DIAGNOSIS — I609 Nontraumatic subarachnoid hemorrhage, unspecified: Secondary | ICD-10-CM

## 2024-04-24 DIAGNOSIS — S065X0A Traumatic subdural hemorrhage without loss of consciousness, initial encounter: Secondary | ICD-10-CM | POA: Diagnosis not present

## 2024-04-24 DIAGNOSIS — W19XXXA Unspecified fall, initial encounter: Secondary | ICD-10-CM

## 2024-04-24 LAB — CBC WITH DIFFERENTIAL/PLATELET
Abs Immature Granulocytes: 0.04 10*3/uL (ref 0.00–0.07)
Basophils Absolute: 0 10*3/uL (ref 0.0–0.1)
Basophils Relative: 0 %
Eosinophils Absolute: 0.4 10*3/uL (ref 0.0–0.5)
Eosinophils Relative: 4 %
HCT: 42.3 % (ref 36.0–46.0)
Hemoglobin: 13.5 g/dL (ref 12.0–15.0)
Immature Granulocytes: 1 %
Lymphocytes Relative: 11 %
Lymphs Abs: 0.9 10*3/uL (ref 0.7–4.0)
MCH: 27.8 pg (ref 26.0–34.0)
MCHC: 31.9 g/dL (ref 30.0–36.0)
MCV: 87.2 fL (ref 80.0–100.0)
Monocytes Absolute: 0.8 10*3/uL (ref 0.1–1.0)
Monocytes Relative: 9 %
Neutro Abs: 6.2 10*3/uL (ref 1.7–7.7)
Neutrophils Relative %: 75 %
Platelets: 259 10*3/uL (ref 150–400)
RBC: 4.85 MIL/uL (ref 3.87–5.11)
RDW: 13.1 % (ref 11.5–15.5)
WBC: 8.3 10*3/uL (ref 4.0–10.5)
nRBC: 0 % (ref 0.0–0.2)

## 2024-04-24 LAB — BASIC METABOLIC PANEL WITH GFR
Anion gap: 12 (ref 5–15)
BUN: 12 mg/dL (ref 8–23)
CO2: 19 mmol/L — ABNORMAL LOW (ref 22–32)
Calcium: 8.8 mg/dL — ABNORMAL LOW (ref 8.9–10.3)
Chloride: 106 mmol/L (ref 98–111)
Creatinine, Ser: 0.82 mg/dL (ref 0.44–1.00)
GFR, Estimated: >60 mL/min (ref >60)
Glucose, Bld: 225 mg/dL — ABNORMAL HIGH (ref 70–99)
Potassium: 3.4 mmol/L — ABNORMAL LOW (ref 3.5–5.1)
Sodium: 137 mmol/L (ref 135–145)

## 2024-04-24 MED ORDER — FENTANYL CITRATE PF 50 MCG/ML IJ SOSY
12.5000 ug | PREFILLED_SYRINGE | Freq: Once | INTRAMUSCULAR | Status: AC
  Administered 2024-04-24: 12.5 ug via INTRAVENOUS
  Filled 2024-04-24: qty 1

## 2024-04-24 MED ORDER — HYDRALAZINE HCL 20 MG/ML IJ SOLN
10.0000 mg | Freq: Once | INTRAMUSCULAR | Status: AC
  Administered 2024-04-24: 10 mg via INTRAVENOUS
  Filled 2024-04-24: qty 1

## 2024-04-24 MED ORDER — LISINOPRIL 20 MG PO TABS
40.0000 mg | ORAL_TABLET | Freq: Once | ORAL | Status: AC
  Administered 2024-04-24: 40 mg via ORAL
  Filled 2024-04-24: qty 2

## 2024-04-24 MED ORDER — AMLODIPINE BESYLATE 5 MG PO TABS
10.0000 mg | ORAL_TABLET | Freq: Once | ORAL | Status: AC
  Administered 2024-04-24: 10 mg via ORAL
  Filled 2024-04-24: qty 2

## 2024-04-24 MED ORDER — ACETAMINOPHEN 325 MG PO TABS
650.0000 mg | ORAL_TABLET | Freq: Once | ORAL | Status: AC
Start: 2024-04-24 — End: 2024-04-24
  Administered 2024-04-24: 650 mg via ORAL
  Filled 2024-04-24: qty 2

## 2024-04-24 MED ORDER — ONDANSETRON HCL 4 MG/2ML IJ SOLN
4.0000 mg | Freq: Once | INTRAMUSCULAR | Status: AC
  Administered 2024-04-24: 4 mg via INTRAVENOUS
  Filled 2024-04-24: qty 2

## 2024-04-24 MED ORDER — HYDROCODONE-ACETAMINOPHEN 5-325 MG PO TABS: 1.0000 | ORAL_TABLET | ORAL | 0 refills | Status: DC | PRN

## 2024-04-24 MED ORDER — HYDROCODONE-ACETAMINOPHEN 5-325 MG PO TABS
1.0000 | ORAL_TABLET | Freq: Once | ORAL | Status: AC
  Administered 2024-04-24: 1 via ORAL
  Filled 2024-04-24: qty 1

## 2024-04-24 MED ORDER — MORPHINE SULFATE (PF) 2 MG/ML IV SOLN
2.0000 mg | Freq: Once | INTRAVENOUS | Status: AC
  Administered 2024-04-24: 2 mg via INTRAVENOUS
  Filled 2024-04-24: qty 1

## 2024-04-24 MED ORDER — ONDANSETRON 4 MG PO TBDP: 4.0000 mg | ORAL_TABLET | Freq: Three times a day (TID) | ORAL | 0 refills | Status: DC | PRN

## 2024-04-24 NOTE — ED Provider Notes (Signed)
 Pt is signed out by Dr. Carol Chroman pending repeat CT scan.  CT head reviewed by me.  I agree with radiology.  IMPRESSION: 1. Small volume of right frontal convexity subarachnoid hemorrhage is mildly increased. 2. No substantial change in size of a right cerebral convexity subdural hematoma which measures up to approximately 5 mm in thickness. No midline shift.  Pt reviewed with Dr. Michale Age again and he said pt is ok to go as long as she is awake and alert and has someone with her.  She is awake and alert.  She is eating/drinking.  Her husband is with her and knows what to look out for and to bring her back if worse.       Sueellen Emery, MD 04/24/24 1115

## 2024-04-24 NOTE — Discharge Instructions (Addendum)
 Do not take your Plavix for 1 week.  Call your heart doctor to see when they want you to restart it.

## 2024-04-24 NOTE — ED Notes (Signed)
 Trauma Response Nurse Documentation   Beth Blankenship is a 73 y.o. female arriving to [facility] ED via [facility]  On clopidogrel 75 mg daily. Trauma was activated as a Level 2 by Primus Brookes based on the following trauma criteria Elderly patients > 65 with head trauma on anti-coagulation (excluding ASA).  Patient cleared for CT by Dr. Carol Chroman. Pt transported to CT with trauma response nurse present to monitor. RN remained with the patient throughout their absence from the department for clinical observation.   GCS 15.  Trauma MD Arrival Time: N/A.  History   Past Medical History:  Diagnosis Date   Arthritis    Cancer (HCC)    Diabetes mellitus without complication (HCC)    H/O mastectomy    left side   History of breast reconstruction    Hypertension    Stroke [facility])    mini strokes     Past Surgical History:  Procedure Laterality Date   MASTECTOMY Left 04/29/2015   REDUCTION MAMMAPLASTY Right    SHOULDER SURGERY Right    TUBAL LIGATION         Initial Focused Assessment (If applicable, or please see trauma documentation): Airway-- intact, no visible obstruction Breathing-- spontaneous, unlabored Circulation-- no obvious bleeding noted  CT's Completed:   CT Head   Interventions:  See event summary  Plan for disposition:  Unknown at this time  Consults completed:  Neurosurgeon at [phone].  Event Summary: Patient brought in by [facility]. Patient with fall out of bed at home. Patient alert and oriented x4, GCS 15. Hematoma to right side of head. Manual BP obtained. Patient to CT with TRN. CT head completed.  MTP Summary (If applicable):  N/A  Bedside handoff with ED RN Fawn Hooks.    Brunetta Capes  Trauma Response RN  Please call TRN at [phone] for further assistance.

## 2024-04-24 NOTE — ED Notes (Signed)
 MD Pollina notifed of patient headache 10/10 pain. Orders given (SEE MAR)

## 2024-04-24 NOTE — ED Provider Notes (Signed)
 [place] EMERGENCY DEPARTMENT AT [facility] Provider Note   CSN: [id number] Arrival date & time: 04/24/24  1478     History  Chief Complaint  Patient presents with   Beth Blankenship    [name] is a 73 y.o. female.  Patient brought to the emergency department for evaluation of head injury.  Patient reports falling out of bed.  She has a contusion on the right forehead area.  Patient denies neck pain, back pain, extremity pain.  There was no loss of consciousness.       Home Medications Prior to Admission medications   Medication Sig Start Date End Date Taking? Authorizing Provider  albuterol  (PROVENTIL  HFA;VENTOLIN  HFA) 108 (90 Base) MCG/ACT inhaler Inhale 2 puffs into the lungs every 6 (six) hours as needed for wheezing or shortness of breath.   Yes [provider]  amLODipine  (NORVASC ) 10 MG tablet Take 1 tablet (10 mg total) by mouth daily. 07/09/17  Yes [place], Betty G, MD  aspirin  EC 81 MG tablet Take 81 mg by mouth daily. Swallow whole.   Yes [provider]  atorvastatin  (LIPITOR) 20 MG tablet Take 1 tablet (20 mg total) by mouth daily. 07/09/17  Yes [place], Betty G, MD  busPIRone  (BUSPAR ) 10 MG tablet Take 1 tablet (10 mg total) by mouth 2 (two) times daily. 03/28/24  Yes Wray Heady, MD  clopidogrel (PLAVIX) 75 MG tablet Take 75 mg by mouth daily. 01/31/24  Yes [provider]  doxazosin  (CARDURA ) 4 MG tablet Take 1 tablet (4 mg total) by mouth 2 (two) times daily. 07/09/17  Yes [place], Betty G, MD  folic acid (FOLVITE) 1 MG tablet Take 1 mg by mouth daily. 03/24/24  Yes [provider]  furosemide  (LASIX ) 40 MG tablet Take 40 mg by mouth daily. 04/07/24  Yes [provider]  hydrOXYzine  (ATARAX ) 10 MG tablet Take 1 tablet (10 mg total) by mouth daily as needed for anxiety. 02/05/24  Yes Wray Heady, MD  lisinopril  (ZESTRIL ) 40 MG tablet Take 40 mg by mouth daily.   Yes [provider]  metoprolol  tartrate  (LOPRESSOR ) 100 MG tablet Take 1 tablet (100 mg total) by mouth 2 (two) times daily. 07/09/17  Yes [place], Betty G, MD  paliperidone  (INVEGA ) 3 MG 24 hr tablet Take 1 tablet (3 mg total) by mouth daily. 04/07/24  Yes Wray Heady, MD  potassium chloride  SA (KLOR-CON  M) 20 MEQ tablet Take 20 mEq by mouth 2 (two) times daily. 03/28/24  Yes [provider]  QUEtiapine  (SEROQUEL ) 100 MG tablet Take 1 tablet (100 mg total) by mouth at bedtime. 02/05/24  Yes Wray Heady, MD  sertraline  (ZOLOFT ) 100 MG tablet Take 1.5 tablets (150 mg total) by mouth daily. 02/05/24  Yes Wray Heady, MD  traZODone  (DESYREL ) 50 MG tablet Take 1 tablet (50 mg total) by mouth at bedtime as needed for sleep. Patient taking differently: Take 50 mg by mouth at bedtime. 02/28/24  Yes Wray Heady, MD      Allergies    Grass pollen([id number]-o-r-t-swt vern), Latex, and Tetracyclines & related    Review of Systems   Review of Systems  Physical Exam Updated Vital Signs BP (!) 179/89   Pulse 62   Temp 98.8 F (37.1 C) (Oral)   Resp 19   Ht 5\' 3"  (1.6 m)   Wt 79.4 kg   SpO2 97%   BMI 31.00 kg/m  Physical Exam Vitals and nursing note reviewed.  Constitutional:  General: She is not in acute distress.    Appearance: She is well-developed.  HENT:     Head: Normocephalic. Contusion present.      Mouth/Throat:     Mouth: Mucous membranes are moist.  Eyes:     General: Vision grossly intact. Gaze aligned appropriately.     Extraocular Movements: Extraocular movements intact.     Conjunctiva/sclera: Conjunctivae normal.  Cardiovascular:     Rate and Rhythm: Normal rate and regular rhythm.     Pulses: Normal pulses.     Heart sounds: Normal heart sounds, S1 normal and S2 normal. No murmur heard.    No friction rub. No gallop.  Pulmonary:     Effort: Pulmonary effort is normal. No respiratory distress.     Breath sounds: Normal breath sounds.  Abdominal:     General: Bowel sounds are normal.      Palpations: Abdomen is soft.     Tenderness: There is no abdominal tenderness. There is no guarding or rebound.     Hernia: No hernia is present.  Musculoskeletal:        General: No swelling.     Cervical back: Full passive range of motion without pain, normal range of motion and neck supple. No spinous process tenderness or muscular tenderness. Normal range of motion.     Right lower leg: No edema.     Left lower leg: No edema.  Skin:    General: Skin is warm and dry.     Capillary Refill: Capillary refill takes less than 2 seconds.     Findings: No ecchymosis, erythema, rash or wound.  Neurological:     General: No focal deficit present.     Mental Status: She is alert and oriented to person, place, and time.     GCS: GCS eye subscore is 4. GCS verbal subscore is 5. GCS motor subscore is 6.     Cranial Nerves: Cranial nerves 2-12 are intact.     Sensory: Sensation is intact.     Motor: Motor function is intact.     Coordination: Coordination is intact.  Psychiatric:        Attention and Perception: Attention normal.        Mood and Affect: Mood normal.        Speech: Speech normal.        Behavior: Behavior normal.     ED Results / Procedures / Treatments   Labs (all labs ordered are listed, but only abnormal results are displayed) Labs Reviewed  BASIC METABOLIC PANEL WITH GFR - Abnormal; Notable for the following components:      Result Value   Potassium 3.4 (*)    CO2 19 (*)    Glucose, Bld 225 (*)    Calcium  8.8 (*)    All other components within normal limits  CBC WITH DIFFERENTIAL/PLATELET    EKG None  Radiology CT Head Wo Contrast Result Date: 04/24/2024 EXAM: CT HEAD WITHOUT 04/24/2024 03:38:46 AM TECHNIQUE: CT of the head was performed without the administration of intravenous contrast. Automated exposure control, iterative reconstruction, and/or weight based adjustment of the mA/kV was utilized to reduce the radiation dose to as low as reasonably achievable.  COMPARISON: None available. CLINICAL HISTORY: Fall on thinners. Level 2 fall on thinners; BIB EMS for a fall out of bed at home, Patient on Plavix, Contusion noted to the right forehead, reports having a headache. HX of previous CVA, HTN, and CHF. A\T\O x4, GCS 15, respirations even and  unlabored, Pupils PERRLA. FINDINGS: BRAIN AND VENTRICLES: There is a small right subdural hemorrhage, measuring up to 5 mm in thickness. Global cortical atrophy. Subcortical and periventricular small vessel ischemic changes. ORBITS: The visualized portion of the orbits demonstrate no acute abnormality. SINUSES: The visualized paranasal sinuses and mastoid air cells demonstrate no acute abnormality. SOFT TISSUES AND SKULL: Small extracranial hematoma overlying the right frontal bone. No acute abnormality of the visualized skull. IMPRESSION: 1. Small right subdural hemorrhage, measuring up to 5 mm in thickness. Critical value/results discussed with Dr Carol Chroman on 04/24/2024 at 0343 hrs. 2. Small extracranial hematoma overlying the right frontal bone. Electronically signed by: Zadie Herter MD 04/24/2024 03:45 AM EDT RP Workstation: [id number]    Procedures Procedures    Medications Ordered in ED Medications  hydrALAZINE  (APRESOLINE ) injection 10 mg (10 mg Intravenous Given 04/24/24 0423)  amLODipine  (NORVASC ) tablet 10 mg (10 mg Oral Given 04/24/24 0506)  lisinopril  (ZESTRIL ) tablet 40 mg (40 mg Oral Given 04/24/24 0506)  acetaminophen  (TYLENOL ) tablet 650 mg (650 mg Oral Given 04/24/24 0506)  fentaNYL  (SUBLIMAZE ) injection 12.5 mcg (12.5 mcg Intravenous Given 04/24/24 0505)    ED Course/ Medical Decision Making/ A&P                                 Medical Decision Making Amount and/or Complexity of Data Reviewed Labs: ordered. Radiology: ordered.  Risk OTC drugs. Prescription drug management.   Presents to the emergency department for evaluation after a fall.  Patient reports falling out of bed this morning.  She  does have a contusion on the right vertex area.  She is awake and alert, oriented.  No focal deficits.  Cervical spine cleared by Nexus criteria.  No other evidence of injury.  CT head shows right-sided subdural hematoma, at 5 mm, no mass effect.  Cussed with Dr. Cabbell, call for neurosurgery.  Stop Plavix.  Repeat CT in 6 hours.  If stable can be discharged.  Contact her cardiologist to restart Plavix.        Final Clinical Impression(s) / ED Diagnoses Final diagnoses:  SDH (subdural hematoma) (HCC)    Rx / DC Orders ED Discharge Orders     None         Czar Ysaguirre, Marine Sia, MD 04/24/24 [phone]

## 2024-04-24 NOTE — ED Triage Notes (Addendum)
 BIB EMS for a fall out of bed at home, Patient on Plavix, Contusion noted to the right forehead, reports having a headache. HX of previous CVA, HTN, and CHF.  A&O x4, GCS 15, respirations even and unlabored, PERRLA.

## 2024-04-24 NOTE — Progress Notes (Signed)
 Orthopedic Tech Progress Note Patient Details:  Beth Blankenship 28-Jan-1951 [id number]  Patient ID: Beth Blankenship, female   DOB: 08/12/1951, 73 y.o.   MRN: [id number] Level 2 trauma Beth Blankenship 04/24/2024, 3:31 AM

## 2024-05-05 ENCOUNTER — Telehealth (HOSPITAL_COMMUNITY): Payer: Self-pay | Admitting: *Deleted

## 2024-05-05 ENCOUNTER — Other Ambulatory Visit: Payer: Self-pay | Admitting: Psychiatry

## 2024-05-05 MED ORDER — QUETIAPINE FUMARATE 100 MG PO TABS: 100.0000 mg | ORAL_TABLET | Freq: Every day | ORAL | 0 refills | Status: DC

## 2024-05-05 NOTE — Telephone Encounter (Signed)
 Refill Rx Request [id number] - [place], [place] - [address] AT [facility] OF [address]   QUEtiapine  (SEROQUEL ) 100 MG tablet last fill 03-31-24  Next Appt   05/07/24 Last Appt    02/05/24

## 2024-05-05 NOTE — Telephone Encounter (Signed)
 Ordered

## 2024-05-07 ENCOUNTER — Encounter (HOSPITAL_COMMUNITY): Payer: Self-pay

## 2024-05-07 ENCOUNTER — Telehealth (HOSPITAL_COMMUNITY): Payer: Medicare Other | Admitting: Psychiatry

## 2024-05-23 ENCOUNTER — Other Ambulatory Visit: Payer: Self-pay

## 2024-05-23 ENCOUNTER — Inpatient Hospital Stay (HOSPITAL_COMMUNITY)
Admission: EM | Admit: 2024-05-23 | Discharge: 2024-05-30 | DRG: 291 | Disposition: A | Discharge status: Skilled Nursing Facility | Attending: Internal Medicine | Admitting: Internal Medicine

## 2024-05-23 ENCOUNTER — Emergency Department (HOSPITAL_COMMUNITY)

## 2024-05-23 ENCOUNTER — Inpatient Hospital Stay (HOSPITAL_COMMUNITY)

## 2024-05-23 DIAGNOSIS — J9621 Acute and chronic respiratory failure with hypoxia: Secondary | ICD-10-CM | POA: Diagnosis present

## 2024-05-23 DIAGNOSIS — F0284 Dementia in other diseases classified elsewhere, unspecified severity, with anxiety: Secondary | ICD-10-CM | POA: Diagnosis present

## 2024-05-23 DIAGNOSIS — E785 Hyperlipidemia, unspecified: Secondary | ICD-10-CM | POA: Diagnosis present

## 2024-05-23 DIAGNOSIS — E538 Deficiency of other specified B group vitamins: Secondary | ICD-10-CM | POA: Diagnosis present

## 2024-05-23 DIAGNOSIS — R569 Unspecified convulsions: Secondary | ICD-10-CM | POA: Diagnosis not present

## 2024-05-23 DIAGNOSIS — F0283 Dementia in other diseases classified elsewhere, unspecified severity, with mood disturbance: Secondary | ICD-10-CM | POA: Diagnosis present

## 2024-05-23 DIAGNOSIS — G4733 Obstructive sleep apnea (adult) (pediatric): Secondary | ICD-10-CM | POA: Diagnosis present

## 2024-05-23 DIAGNOSIS — I130 Hypertensive heart and chronic kidney disease with heart failure and stage 1 through stage 4 chronic kidney disease, or unspecified chronic kidney disease: Principal | ICD-10-CM | POA: Diagnosis present

## 2024-05-23 DIAGNOSIS — N179 Acute kidney failure, unspecified: Secondary | ICD-10-CM | POA: Diagnosis present

## 2024-05-23 DIAGNOSIS — T43595A Adverse effect of other antipsychotics and neuroleptics, initial encounter: Secondary | ICD-10-CM | POA: Diagnosis present

## 2024-05-23 DIAGNOSIS — I2723 Pulmonary hypertension due to lung diseases and hypoxia: Secondary | ICD-10-CM | POA: Diagnosis present

## 2024-05-23 DIAGNOSIS — G3183 Dementia with Lewy bodies: Secondary | ICD-10-CM | POA: Diagnosis present

## 2024-05-23 DIAGNOSIS — I5033 Acute on chronic diastolic (congestive) heart failure: Secondary | ICD-10-CM | POA: Diagnosis present

## 2024-05-23 DIAGNOSIS — Z8673 Personal history of transient ischemic attack (TIA), and cerebral infarction without residual deficits: Secondary | ICD-10-CM

## 2024-05-23 DIAGNOSIS — I509 Heart failure, unspecified: Secondary | ICD-10-CM | POA: Diagnosis present

## 2024-05-23 DIAGNOSIS — Z7982 Long term (current) use of aspirin: Secondary | ICD-10-CM

## 2024-05-23 DIAGNOSIS — E559 Vitamin D deficiency, unspecified: Secondary | ICD-10-CM | POA: Diagnosis present

## 2024-05-23 DIAGNOSIS — N182 Chronic kidney disease, stage 2 (mild): Secondary | ICD-10-CM | POA: Diagnosis present

## 2024-05-23 DIAGNOSIS — F333 Major depressive disorder, recurrent, severe with psychotic symptoms: Secondary | ICD-10-CM | POA: Diagnosis present

## 2024-05-23 DIAGNOSIS — E1122 Type 2 diabetes mellitus with diabetic chronic kidney disease: Secondary | ICD-10-CM | POA: Diagnosis present

## 2024-05-23 DIAGNOSIS — E1165 Type 2 diabetes mellitus with hyperglycemia: Secondary | ICD-10-CM | POA: Diagnosis present

## 2024-05-23 DIAGNOSIS — T502X5A Adverse effect of carbonic-anhydrase inhibitors, benzothiadiazides and other diuretics, initial encounter: Secondary | ICD-10-CM | POA: Diagnosis not present

## 2024-05-23 DIAGNOSIS — E876 Hypokalemia: Secondary | ICD-10-CM | POA: Diagnosis not present

## 2024-05-23 DIAGNOSIS — Z7984 Long term (current) use of oral hypoglycemic drugs: Secondary | ICD-10-CM | POA: Diagnosis not present

## 2024-05-23 DIAGNOSIS — G251 Drug-induced tremor: Secondary | ICD-10-CM | POA: Diagnosis present

## 2024-05-23 DIAGNOSIS — Z91040 Latex allergy status: Secondary | ICD-10-CM

## 2024-05-23 DIAGNOSIS — Z7902 Long term (current) use of antithrombotics/antiplatelets: Secondary | ICD-10-CM

## 2024-05-23 DIAGNOSIS — Z9981 Dependence on supplemental oxygen: Secondary | ICD-10-CM

## 2024-05-23 DIAGNOSIS — Z87891 Personal history of nicotine dependence: Secondary | ICD-10-CM

## 2024-05-23 DIAGNOSIS — F0282 Dementia in other diseases classified elsewhere, unspecified severity, with psychotic disturbance: Secondary | ICD-10-CM | POA: Diagnosis present

## 2024-05-23 DIAGNOSIS — F0390 Unspecified dementia without behavioral disturbance: Secondary | ICD-10-CM | POA: Diagnosis not present

## 2024-05-23 DIAGNOSIS — J449 Chronic obstructive pulmonary disease, unspecified: Secondary | ICD-10-CM | POA: Diagnosis present

## 2024-05-23 DIAGNOSIS — Z79899 Other long term (current) drug therapy: Secondary | ICD-10-CM

## 2024-05-23 DIAGNOSIS — E119 Type 2 diabetes mellitus without complications: Secondary | ICD-10-CM

## 2024-05-23 DIAGNOSIS — J9601 Acute respiratory failure with hypoxia: Principal | ICD-10-CM

## 2024-05-23 LAB — BASIC METABOLIC PANEL WITH GFR
Anion gap: 11 (ref 5–15)
Anion gap: 9 (ref 5–15)
BUN: 21 mg/dL (ref 8–23)
BUN: 21 mg/dL (ref 8–23)
CO2: 21 mmol/L — ABNORMAL LOW (ref 22–32)
CO2: 21 mmol/L — ABNORMAL LOW (ref 22–32)
Calcium: 8.4 mg/dL — ABNORMAL LOW (ref 8.9–10.3)
Calcium: 8.3 mg/dL — ABNORMAL LOW (ref 8.9–10.3)
Chloride: 107 mmol/L (ref 98–111)
Chloride: 109 mmol/L (ref 98–111)
Creatinine, Ser: 1.26 mg/dL — ABNORMAL HIGH (ref 0.44–1.00)
Creatinine, Ser: 1.36 mg/dL — ABNORMAL HIGH (ref 0.44–1.00)
GFR, Estimated: 45 mL/min — ABNORMAL LOW (ref >60)
GFR, Estimated: 41 mL/min — ABNORMAL LOW (ref >60)
Glucose, Bld: 273 mg/dL — ABNORMAL HIGH (ref 70–99)
Glucose, Bld: 262 mg/dL — ABNORMAL HIGH (ref 70–99)
Potassium: 3.5 mmol/L (ref 3.5–5.1)
Potassium: 3.9 mmol/L (ref 3.5–5.1)
Sodium: 139 mmol/L (ref 135–145)
Sodium: 139 mmol/L (ref 135–145)

## 2024-05-23 LAB — ECHOCARDIOGRAM COMPLETE
AR max vel: 1.55 cm2
AV Area VTI: 1.59 cm2
AV Area mean vel: 1.47 cm2
AV Mean grad: 7 mmHg
AV Peak grad: 12.1 mmHg
Ao pk vel: 1.74 m/s
Area-P 1/2: 2.29 cm2
Calc EF: 59.6 %
Height: 63 in
Single Plane A2C EF: 60.4 %
Single Plane A4C EF: 58.7 %
Weight: 2799.98 [oz_av]

## 2024-05-23 LAB — BLOOD GAS, VENOUS
Acid-base deficit: 0.8 mmol/L (ref 0.0–2.0)
Bicarbonate: 24.9 mmol/L (ref 20.0–28.0)
O2 Saturation: 75.1 %
Patient temperature: 37
pCO2, Ven: 44 mmHg (ref 44–60)
pH, Ven: 7.36 (ref 7.25–7.43)
pO2, Ven: 47 mmHg — ABNORMAL HIGH (ref 32–45)

## 2024-05-23 LAB — CBC
HCT: 37.5 % (ref 36.0–46.0)
HCT: 36.4 % (ref 36.0–46.0)
Hemoglobin: 11.5 g/dL — ABNORMAL LOW (ref 12.0–15.0)
Hemoglobin: 11.2 g/dL — ABNORMAL LOW (ref 12.0–15.0)
MCH: 27.3 pg (ref 26.0–34.0)
MCH: 27.6 pg (ref 26.0–34.0)
MCHC: 30.7 g/dL (ref 30.0–36.0)
MCHC: 30.8 g/dL (ref 30.0–36.0)
MCV: 89.1 fL (ref 80.0–100.0)
MCV: 89.7 fL (ref 80.0–100.0)
Platelets: 244 10*3/uL (ref 150–400)
Platelets: 231 10*3/uL (ref 150–400)
RBC: 4.21 MIL/uL (ref 3.87–5.11)
RBC: 4.06 MIL/uL (ref 3.87–5.11)
RDW: 14.2 % (ref 11.5–15.5)
RDW: 14.3 % (ref 11.5–15.5)
WBC: 7.9 10*3/uL (ref 4.0–10.5)
WBC: 8 10*3/uL (ref 4.0–10.5)
nRBC: 0 % (ref 0.0–0.2)
nRBC: 0 % (ref 0.0–0.2)

## 2024-05-23 LAB — CBG MONITORING, ED
Glucose-Capillary: 260 mg/dL — ABNORMAL HIGH (ref 70–99)
Glucose-Capillary: 159 mg/dL — ABNORMAL HIGH (ref 70–99)

## 2024-05-23 LAB — BRAIN NATRIURETIC PEPTIDE: B Natriuretic Peptide: 916.4 pg/mL — ABNORMAL HIGH (ref 0.0–100.0)

## 2024-05-23 LAB — GLUCOSE, CAPILLARY
Glucose-Capillary: 213 mg/dL — ABNORMAL HIGH (ref 70–99)
Glucose-Capillary: 174 mg/dL — ABNORMAL HIGH (ref 70–99)

## 2024-05-23 LAB — HEMOGLOBIN A1C
Hgb A1c MFr Bld: 8.6 % — ABNORMAL HIGH (ref 4.8–5.6)
Mean Plasma Glucose: 200.12 mg/dL

## 2024-05-23 LAB — MAGNESIUM: Magnesium: 1.8 mg/dL (ref 1.7–2.4)

## 2024-05-23 LAB — PHOSPHORUS: Phosphorus: 4 mg/dL (ref 2.5–4.6)

## 2024-05-23 MED ORDER — POLYETHYLENE GLYCOL 3350 17 G PO PACK: 17.0000 g | PACK | Freq: Every day | ORAL | Status: DC | PRN

## 2024-05-23 MED ORDER — AMLODIPINE BESYLATE 5 MG PO TABS: 10.0000 mg | ORAL_TABLET | Freq: Every day | ORAL | Status: DC

## 2024-05-23 MED ORDER — ONDANSETRON 4 MG PO TBDP: 4.0000 mg | ORAL_TABLET | Freq: Three times a day (TID) | ORAL | Status: DC | PRN

## 2024-05-23 MED ORDER — BUSPIRONE HCL 10 MG PO TABS
10.0000 mg | ORAL_TABLET | Freq: Two times a day (BID) | ORAL | Status: DC
  Administered 2024-05-23 – 2024-05-30 (×14): 10 mg via ORAL
  Filled 2024-05-23 (×14): qty 1

## 2024-05-23 MED ORDER — CLOPIDOGREL BISULFATE 75 MG PO TABS
75.0000 mg | ORAL_TABLET | Freq: Every day | ORAL | Status: DC
  Administered 2024-05-24 – 2024-05-30 (×7): 75 mg via ORAL
  Filled 2024-05-23 (×7): qty 1

## 2024-05-23 MED ORDER — FUROSEMIDE 10 MG/ML IJ SOLN
40.0000 mg | Freq: Two times a day (BID) | INTRAMUSCULAR | Status: AC
  Administered 2024-05-23 – 2024-05-24 (×3): 40 mg via INTRAVENOUS
  Filled 2024-05-23 (×3): qty 4

## 2024-05-23 MED ORDER — METOPROLOL TARTRATE 100 MG PO TABS
100.0000 mg | ORAL_TABLET | Freq: Two times a day (BID) | ORAL | Status: DC
  Administered 2024-05-23 – 2024-05-30 (×12): 100 mg via ORAL
  Filled 2024-05-23 (×14): qty 1

## 2024-05-23 MED ORDER — QUETIAPINE FUMARATE 100 MG PO TABS
100.0000 mg | ORAL_TABLET | Freq: Every day | ORAL | Status: DC
  Administered 2024-05-23 – 2024-05-29 (×7): 100 mg via ORAL
  Filled 2024-05-23 (×7): qty 1

## 2024-05-23 MED ORDER — ACETAMINOPHEN 325 MG PO TABS
650.0000 mg | ORAL_TABLET | Freq: Four times a day (QID) | ORAL | Status: DC | PRN
  Administered 2024-05-29: 650 mg via ORAL
  Filled 2024-05-23: qty 2

## 2024-05-23 MED ORDER — FUROSEMIDE 10 MG/ML IJ SOLN
40.0000 mg | Freq: Once | INTRAMUSCULAR | Status: AC
  Administered 2024-05-23: 40 mg via INTRAVENOUS
  Filled 2024-05-23: qty 4

## 2024-05-23 MED ORDER — INSULIN ASPART 100 UNIT/ML IJ SOLN
0.0000 [IU] | Freq: Three times a day (TID) | INTRAMUSCULAR | Status: DC
  Administered 2024-05-23 (×2): 2 [IU] via SUBCUTANEOUS
  Administered 2024-05-23: 5 [IU] via SUBCUTANEOUS
  Administered 2024-05-24 (×3): 3 [IU] via SUBCUTANEOUS
  Administered 2024-05-25: 5 [IU] via SUBCUTANEOUS
  Administered 2024-05-25: 2 [IU] via SUBCUTANEOUS
  Administered 2024-05-25: 3 [IU] via SUBCUTANEOUS
  Administered 2024-05-26: 5 [IU] via SUBCUTANEOUS
  Administered 2024-05-26 (×2): 2 [IU] via SUBCUTANEOUS
  Administered 2024-05-27: 3 [IU] via SUBCUTANEOUS
  Administered 2024-05-27: 5 [IU] via SUBCUTANEOUS
  Administered 2024-05-27 – 2024-05-28 (×3): 2 [IU] via SUBCUTANEOUS
  Administered 2024-05-28: 3 [IU] via SUBCUTANEOUS
  Administered 2024-05-29 – 2024-05-30 (×4): 2 [IU] via SUBCUTANEOUS
  Administered 2024-05-30: 3 [IU] via SUBCUTANEOUS
  Administered 2024-05-30: 2 [IU] via SUBCUTANEOUS

## 2024-05-23 MED ORDER — ASPIRIN 81 MG PO TBEC
81.0000 mg | DELAYED_RELEASE_TABLET | Freq: Every day | ORAL | Status: DC
  Administered 2024-05-24 – 2024-05-30 (×7): 81 mg via ORAL
  Filled 2024-05-23 (×7): qty 1

## 2024-05-23 MED ORDER — ENOXAPARIN SODIUM 40 MG/0.4ML IJ SOSY
40.0000 mg | PREFILLED_SYRINGE | Freq: Every day | INTRAMUSCULAR | Status: DC
  Administered 2024-05-23 – 2024-05-30 (×8): 40 mg via SUBCUTANEOUS
  Filled 2024-05-23 (×8): qty 0.4

## 2024-05-23 MED ORDER — INSULIN ASPART 100 UNIT/ML IJ SOLN
0.0000 [IU] | Freq: Every day | INTRAMUSCULAR | Status: DC
  Administered 2024-05-24: 3 [IU] via SUBCUTANEOUS
  Administered 2024-05-26: 2 [IU] via SUBCUTANEOUS
  Administered 2024-05-28: 3 [IU] via SUBCUTANEOUS
  Administered 2024-05-29: 2 [IU] via SUBCUTANEOUS

## 2024-05-23 MED ORDER — NITROGLYCERIN 2 % TD OINT
1.0000 [in_us] | TOPICAL_OINTMENT | Freq: Once | TRANSDERMAL | Status: AC
  Administered 2024-05-23: 1 [in_us] via TOPICAL
  Filled 2024-05-23: qty 1

## 2024-05-23 MED ORDER — DOXAZOSIN MESYLATE 4 MG PO TABS
4.0000 mg | ORAL_TABLET | Freq: Two times a day (BID) | ORAL | Status: DC
  Administered 2024-05-23 – 2024-05-30 (×15): 4 mg via ORAL
  Filled 2024-05-23 (×16): qty 1

## 2024-05-23 MED ORDER — IPRATROPIUM-ALBUTEROL 0.5-2.5 (3) MG/3ML IN SOLN
3.0000 mL | Freq: Four times a day (QID) | RESPIRATORY_TRACT | Status: DC | PRN
  Administered 2024-05-24: 3 mL via RESPIRATORY_TRACT
  Filled 2024-05-23: qty 3

## 2024-05-23 MED ORDER — PROCHLORPERAZINE EDISYLATE 10 MG/2ML IJ SOLN: 5.0000 mg | Freq: Four times a day (QID) | INTRAMUSCULAR | Status: DC | PRN

## 2024-05-23 MED ORDER — HYDROXYZINE HCL 10 MG PO TABS
10.0000 mg | ORAL_TABLET | Freq: Every day | ORAL | Status: DC | PRN
  Administered 2024-05-24 – 2024-05-30 (×6): 10 mg via ORAL
  Filled 2024-05-23 (×7): qty 1

## 2024-05-23 MED ORDER — MELATONIN 5 MG PO TABS: 5.0000 mg | ORAL_TABLET | Freq: Every evening | ORAL | Status: DC | PRN

## 2024-05-23 MED ORDER — AMLODIPINE BESYLATE 10 MG PO TABS
10.0000 mg | ORAL_TABLET | Freq: Every day | ORAL | Status: DC
  Administered 2024-05-23 – 2024-05-30 (×8): 10 mg via ORAL
  Filled 2024-05-23 (×4): qty 1
  Filled 2024-05-23: qty 2
  Filled 2024-05-23 (×3): qty 1

## 2024-05-23 MED ORDER — DOXAZOSIN MESYLATE 4 MG PO TABS
4.0000 mg | ORAL_TABLET | Freq: Two times a day (BID) | ORAL | Status: DC
  Filled 2024-05-23 (×2): qty 1

## 2024-05-23 MED ORDER — PALIPERIDONE ER 3 MG PO TB24
3.0000 mg | ORAL_TABLET | Freq: Every day | ORAL | Status: DC
  Administered 2024-05-24 – 2024-05-26 (×3): 3 mg via ORAL
  Filled 2024-05-23 (×4): qty 1

## 2024-05-23 NOTE — ED Notes (Signed)
 ECHO at bedside.

## 2024-05-23 NOTE — Progress Notes (Signed)
 OT Cancellation Note  Patient Details Name: Beth Blankenship MRN: [id number] DOB: 07/18/1951   Cancelled Treatment:    Reason Eval/Treat Not Completed: Medical issues which prohibited therapy.  Patient placed back on Bi-pap, resting comfortably.  OT will check back as time allows.    Octavia Belton Tracker Mance 05/23/2024, 10:33 AM 05/23/2024  RP, OTR/L  Acute Rehabilitation Services  Office:  [phone]

## 2024-05-23 NOTE — Progress Notes (Signed)
 BP 181/79 and patient is asymptomatic.  Dr. Brice Campi notified of BP and no new orders given at this time.

## 2024-05-23 NOTE — ED Notes (Signed)
 Pt provided with sandwich, applesauce, and crackers

## 2024-05-23 NOTE — ED Triage Notes (Signed)
 Pt BIB EMS from home. EMS reports they were called out for SOB, initially believed to be COPD/asthma flare up, pt was 80's RA.  Reports lung sounds initially diminished with wheezing, and ems reports rales. Pt arrives on cpap,  HR 60. 162/96  EMS admin duo neb and sublingual nitroglycerin

## 2024-05-23 NOTE — ED Notes (Signed)
 Pt states she is feeling short of breath and requests to go back on Bipap; Respiratory contacted via cell phone for same.

## 2024-05-23 NOTE — ED Notes (Signed)
 This provider reached out to MD Sulema Endo regarding possible iv bp meds to address htn due to the possibility of pt needing to go back on Bipap. MD Sulema Endo advised PO meds since pt was able to tolerate food.

## 2024-05-23 NOTE — H&P (Addendum)
 History and Physical    Patient: Beth Blankenship:[id number] DOB: [birth year] DOA: 05/23/2024 DOS: the patient was seen and examined on 05/23/2024 PCP: Yvonnie Heritage, FNP  Patient coming from: Home  Chief Complaint:  Chief Complaint  Patient presents with   Shortness of Breath   HPI: Beth Blankenship is a 73 y.o. female with medical history significant of HTN, CVA, and breast cancer p/w SOB, and elevated BNP c/w HFpEF exacerbation.  Pt was in her USOH until 1600 yesterday she experienced SOB while watching television. She turned up her home O2 from 4L [place] to 5L [place] and had mild to no improvement. She continued throughout the day with SOB that worsened with mild activity like getting up to go to the restroom. She became increasingly concerned when she went to bed at 2300 and was unable to lay flat; in fact, she felt like she was "drowning" and activated EMS. Of note, the patient reports eating out multiple times a week and does not adhere to a low salt diet.  In the ED, pt bradycardic and hypertensive. Labs notable for Cr 1.36 (baseline 0.8) and BNP 916. CXR with b/l pulmonary edema. Pt admitted to medicine for diuresis.   Review of Systems: As mentioned in the history of present illness. All other systems reviewed and are negative. Past Medical History:  Diagnosis Date   Arthritis    Cancer (HCC)    Diabetes mellitus without complication (HCC)    H/O mastectomy    left side   History of breast reconstruction    Hypertension    Stroke [facility])    mini strokes   Past Surgical History:  Procedure Laterality Date   MASTECTOMY Left 04/29/2015   REDUCTION MAMMAPLASTY Right    SHOULDER SURGERY Right    TUBAL LIGATION     Social History:  reports that she has quit smoking. She has never used smokeless tobacco. She reports that she does not drink alcohol and does not use drugs.  Allergies  Allergen Reactions   Grass Pollen(K-O-R-T-Swt Vern)    Latex Hives   Tetracyclines &  Related Hives    Family History  Problem Relation Age of Onset   Heart disease Mother    Hypertension Mother    Stroke Mother    Heart disease Father    Diabetes Paternal Aunt    Diabetes Paternal Uncle    Cancer Maternal Grandmother        colon    Prior to Admission medications   Medication Sig Start Date End Date Taking? Authorizing Provider  albuterol  (PROVENTIL  HFA;VENTOLIN  HFA) 108 (90 Base) MCG/ACT inhaler Inhale 2 puffs into the lungs every 6 (six) hours as needed for wheezing or shortness of breath.   Yes [provider]  amLODipine  (NORVASC ) 10 MG tablet Take 1 tablet (10 mg total) by mouth daily. 07/09/17  Yes [place], Betty G, MD  aspirin  EC 81 MG tablet Take 81 mg by mouth daily. Swallow whole.   Yes [provider]  busPIRone  (BUSPAR ) 10 MG tablet Take 1 tablet (10 mg total) by mouth 2 (two) times daily. 03/28/24  Yes Wray Heady, MD  clopidogrel (PLAVIX) 75 MG tablet Take 75 mg by mouth daily. 01/31/24  Yes [provider]  doxazosin  (CARDURA ) 4 MG tablet Take 1 tablet (4 mg total) by mouth 2 (two) times daily. 07/09/17  Yes [place], Betty G, MD  folic acid (FOLVITE) 1 MG tablet Take 1 mg by mouth daily. 03/24/24  Yes [provider]  furosemide  (LASIX ) 40 MG tablet Take 40 mg by mouth daily. 04/07/24  Yes [provider]  hydrOXYzine  (ATARAX ) 10 MG tablet Take 1 tablet (10 mg total) by mouth daily as needed for anxiety. 02/05/24  Yes Wray Heady, MD  lisinopril  (ZESTRIL ) 40 MG tablet Take 40 mg by mouth in the morning.   Yes [provider]  Melatonin 5 MG CHEW Chew 10 mg by mouth.   Yes [provider]  metoprolol  tartrate (LOPRESSOR ) 100 MG tablet Take 1 tablet (100 mg total) by mouth 2 (two) times daily. 07/09/17  Yes [place], Betty G, MD  ondansetron  (ZOFRAN -ODT) 4 MG disintegrating tablet Take 1 tablet (4 mg total) by mouth every 8 (eight) hours as needed. 04/24/24  Yes Sueellen Emery, MD  paliperidone  (INVEGA )  3 MG 24 hr tablet Take 1 tablet (3 mg total) by mouth daily. 04/07/24  Yes Wray Heady, MD  potassium chloride  SA (KLOR-CON  M) 20 MEQ tablet Take 20 mEq by mouth 2 (two) times daily. 03/28/24  Yes [provider]  QUEtiapine  (SEROQUEL ) 100 MG tablet Take 1 tablet (100 mg total) by mouth at bedtime. 05/05/24 06/04/24 Yes Todd Fossa, MD  traZODone  (DESYREL ) 50 MG tablet Take 1 tablet (50 mg total) by mouth at bedtime as needed for sleep. Patient taking differently: Take 50 mg by mouth at bedtime. 02/28/24  Yes Wray Heady, MD  empagliflozin (JARDIANCE) 10 MG TABS tablet Take 10 mg by mouth daily. Patient not taking: Reported on 05/23/2024 05/02/24   [provider]    Physical Exam: Vitals:   05/23/24 1245 05/23/24 1319 05/23/24 1330 05/23/24 1357  BP:  (!) 198/103 (!) 198/92   Pulse: 64  70   Resp: (!) 28  (!) 25   Temp:    98.5 F (36.9 C)  TempSrc:      SpO2: 95%  96%   Weight:      Height:       General: Alert, oriented x3, resting comfortably in no acute distress HEENT: EOMI, oropharynx clear, moist mucous membranes, hearing intact Neck: Trachea midline and no gross thyromegaly Respiratory: Lungs clear to auscultation bilaterally with normal respiratory effort; no w/r/r Cardiovascular: Regular rate and rhythm w/o m/r/g Abdomen: Soft, nontender, nondistended. Positive bowel sounds MSK: No obvious joint deformities or swelling Skin: No obvious rashes or lesions Neurologic: Awake, alert, spontaneously moves all extremities, strength intact Psychiatric: Appropriate mood and affect, conversational and cooperative  Data Reviewed:  Lab Results  Component Value Date   WBC 8.0 05/23/2024   HGB 11.2 (L) 05/23/2024   HCT 36.4 05/23/2024   MCV 89.7 05/23/2024   PLT 231 05/23/2024   Lab Results  Component Value Date   GLUCOSE 262 (H) 05/23/2024   CALCIUM  8.3 (L) 05/23/2024   NA 139 05/23/2024   K 3.9 05/23/2024   CO2 21 (L) 05/23/2024   CL 109 05/23/2024    BUN 21 05/23/2024   CREATININE 1.36 (H) 05/23/2024   Lab Results  Component Value Date   ALT 22 06/24/2017   AST 17 06/24/2017   ALKPHOS 86 06/24/2017   BILITOT 0.6 06/24/2017   No results found for: "INR", "PROTIME"  Radiology: ECHOCARDIOGRAM COMPLETE Result Date: 05/23/2024    ECHOCARDIOGRAM REPORT   Patient Name:   JOVONDA SELNER Date of Exam: 05/23/2024 Medical Rec #:  [id number]          Height:       63.0 in Accession #:    [id number]  Weight:       175.0 lb Date of Birth:  01-Oct-1951          BSA:          1.827 m Patient Age:    73 years           BP:           183/77 mmHg Patient Gender: F                  HR:           51 bpm. Exam Location:  Inpatient Procedure: 2D Echo, Cardiac Doppler and Color Doppler (Both Spectral and Color            Flow Doppler were utilized during procedure). Indications:    CHF  History:        Patient has no prior history of Echocardiogram examinations.                 Risk Factors:Hypertension.  Sonographer:    Janette Medley Referring Phys: [id number] Charlotta Cook HALL  Sonographer Comments: Echo performed with patient supine and on artificial respirator. IMPRESSIONS  1. Left ventricular ejection fraction, by estimation, is 60 to 65%. The left ventricle has normal function. The left ventricle has no regional wall motion abnormalities. There is moderate left ventricular hypertrophy. Left ventricular diastolic function  could not be evaluated due to severe MAC but is likely abnormal. There is the interventricular septum is flattened in systole, consistent with right ventricular pressure overload.  2. Right ventricular systolic function is moderately reduced. The right ventricular size is moderately enlarged. There is severely elevated pulmonary artery systolic pressure. The estimated right ventricular systolic pressure is 86.5 mmHg.  3. Right atrial size was mildly dilated.  4. The mitral valve is normal in structure. No evidence of mitral valve regurgitation. No  evidence of mitral stenosis. Moderate mitral annular calcification.  5. The aortic valve is normal in structure. Aortic valve regurgitation is not visualized. No aortic stenosis is present.  6. The inferior vena cava is dilated in size with >50% respiratory variability, suggesting right atrial pressure of 8 mmHg. FINDINGS  Left Ventricle: Left ventricular ejection fraction, by estimation, is 60 to 65%. The left ventricle has normal function. The left ventricle has no regional wall motion abnormalities. The left ventricular internal cavity size was normal in size. There is  moderate left ventricular hypertrophy. The interventricular septum is flattened in systole, consistent with right ventricular pressure overload. Left ventricular diastolic function could not be evaluated due to mitral annular calcification (moderate or greater). Left ventricular diastolic function could not be evaluated. Right Ventricle: The right ventricular size is moderately enlarged. No increase in right ventricular wall thickness. Right ventricular systolic function is moderately reduced. There is severely elevated pulmonary artery systolic pressure. The tricuspid regurgitant velocity is 4.43 m/s, and with an assumed right atrial pressure of 8 mmHg, the estimated right ventricular systolic pressure is 86.5 mmHg. Left Atrium: Left atrial size was normal in size. Right Atrium: Right atrial size was mildly dilated. Pericardium: There is no evidence of pericardial effusion. Mitral Valve: The mitral valve is normal in structure. Moderate mitral annular calcification. No evidence of mitral valve regurgitation. No evidence of mitral valve stenosis. Tricuspid Valve: The tricuspid valve is normal in structure. Tricuspid valve regurgitation is not demonstrated. No evidence of tricuspid stenosis. Aortic Valve: The aortic valve is normal in structure. Aortic valve regurgitation is not visualized. No aortic stenosis is present. Aortic  valve mean gradient  measures 7.0 mmHg. Aortic valve peak gradient measures 12.1 mmHg. Aortic valve area, by VTI measures 1.59 cm. Pulmonic Valve: The pulmonic valve was normal in structure. Pulmonic valve regurgitation is not visualized. No evidence of pulmonic stenosis. Aorta: The aortic root is normal in size and structure. Venous: The inferior vena cava is dilated in size with greater than 50% respiratory variability, suggesting right atrial pressure of 8 mmHg. IAS/Shunts: No atrial level shunt detected by color flow Doppler.  LEFT VENTRICLE PLAX 2D LVOT diam:     1.90 cm     Diastology LV SV:         61          LV e' medial:    3.05 cm/s LV SV Index:   33          LV E/e' medial:  31.9 LVOT Area:     2.84 cm    LV e' lateral:   4.35 cm/s                            LV E/e' lateral: 22.4  LV Volumes (MOD) LV vol d, MOD A2C: 41.7 ml LV vol d, MOD A4C: 46.3 ml LV vol s, MOD A2C: 16.5 ml LV vol s, MOD A4C: 19.1 ml LV SV MOD A2C:     25.2 ml LV SV MOD A4C:     46.3 ml LV SV MOD BP:      26.7 ml RIGHT VENTRICLE             IVC RV S prime:     11.90 cm/s  IVC diam: 2.30 cm TAPSE (M-mode): 1.8 cm LEFT ATRIUM             Index        RIGHT ATRIUM           Index LA Vol (A2C):   23.9 ml 13.08 ml/m  RA Area:     19.90 cm LA Vol (A4C):   33.5 ml 18.34 ml/m  RA Volume:   56.50 ml  30.93 ml/m LA Biplane Vol: 29.1 ml 15.93 ml/m  AORTIC VALVE AV Area (Vmax):    1.55 cm AV Area (Vmean):   1.47 cm AV Area (VTI):     1.59 cm AV Vmax:           174.00 cm/s AV Vmean:          125.000 cm/s AV VTI:            0.384 m AV Peak Grad:      12.1 mmHg AV Mean Grad:      7.0 mmHg LVOT Vmax:         95.30 cm/s LVOT Vmean:        64.600 cm/s LVOT VTI:          0.215 m LVOT/AV VTI ratio: 0.56  AORTA Ao Root diam: 2.10 cm Ao Asc diam:  3.30 cm MITRAL VALVE                TRICUSPID VALVE MV Area (PHT): 2.29 cm     TR Peak grad:   78.5 mmHg MV Decel Time: 331 msec     TR Vmax:        443.00 cm/s MV E velocity: 97.30 cm/s MV A velocity: 118.00 cm/s  SHUNTS  MV E/A ratio:  0.82         Systemic VTI:  0.22 m                             Systemic Diam: 1.90 cm Arta Lark Electronically signed by Arta Lark Signature Date/Time: 05/23/2024/12:28:05 PM    Final    DG Chest Port 1 View Result Date: 05/23/2024 CLINICAL DATA:  Shortness of breath EXAM: PORTABLE CHEST 1 VIEW COMPARISON:  Radiograph 02/21/2022 FINDINGS: Stable cardiomediastinal silhouette. Aortic atherosclerotic calcification. Patchy bilateral airspace and interstitial opacities greatest in the left mid and lower lung. Probable small bilateral pleural effusions. No pneumothorax. IMPRESSION: Patchy bilateral airspace and interstitial opacities greatest in the left mid and lower lung. Findings may be due to edema or infection. Electronically Signed   By: Rozell Cornet M.D.   On: 05/23/2024 01:44    Assessment and Plan: 42F h/o HTN, DM2, CVA, and breast cancer p/w SOB, and elevated BNP c/w HFpEF exacerbation.  HFpEF exacerbation Elevated BNP SOB/DOE -RD consulted for low sodium diet education; apprec eval/recs -IV lasix  40mg  BID for now; goal net neg 1-2L/d; strict I/Os; daily standing weights; K>4/Mg>2 -PTA metoprolol  100mg  BID and ASA 81mg  daily -F/u TTE -Consider increasing pta diuretics on discharge  Pulmonary HTN RVSP 86.30mmHg; presumably related to volume overload from above -Diuresis per above -Consider OP vs IP PFTs to exclude COPD  H/o HTN -PTA amlodipine   H/o DM2 -SSI TID AC prn   H/o CVA -PTA aspirin  and plavix  H/o unspecified schizophrenia -PTA Invega  3mg  daily, buspar  and seroquel    Advance Care Planning:   Code Status: Full Code   Consults: N/A  Family Communication: N/A  Severity of Illness: The appropriate patient status for this patient is INPATIENT. Inpatient status is judged to be reasonable and necessary in order to provide the required intensity of service to ensure the patient's safety. The patient's presenting symptoms, physical exam  findings, and initial radiographic and laboratory data in the context of their chronic comorbidities is felt to place them at high risk for further clinical deterioration. Furthermore, it is not anticipated that the patient will be medically stable for discharge from the hospital within 2 midnights of admission.   * I certify that at the point of admission it is my clinical judgment that the patient will require inpatient hospital care spanning beyond 2 midnights from the point of admission due to high intensity of service, high risk for further deterioration and high frequency of surveillance required.*   ------- I spent 55 minutes reviewing previous labs/notes, obtaining separate history at the bedside, counseling/discussing the treatment plan outlined above, ordering medications/tests, and performing clinical documentation.  Author: Arne Langdon, MD 05/23/2024 2:48 PM  For on call review [email or web address].

## 2024-05-23 NOTE — Inpatient Diabetes Management (Deleted)
 Inpatient Diabetes Program Recommendations  AACE/ADA: New Consensus Statement on Inpatient Glycemic Control (2015)  Target Ranges:  Prepandial:   less than 140 mg/dL      Peak postprandial:   less than 180 mg/dL (1-2 hours)      Critically ill patients:  140 - 180 mg/dL   Lab Results  Component Value Date   GLUCAP 159 (H) 05/23/2024   HGBA1C 8.6 (H) 05/23/2024    Review of Glycemic Control  Latest Reference Range & Units 05/23/24 08:18 05/23/24 12:06  Glucose-Capillary 70 - 99 mg/dL 161 (H) 096 (H)   Diabetes history: DM 2 Outpatient Diabetes medications:  Jardiance 10 mg daily Current orders for Inpatient glycemic control:  Novolog  0-9 units tid with meals and HS Inpatient Diabetes Program Recommendations:

## 2024-05-23 NOTE — Progress Notes (Signed)
   05/23/24 0136  Vent Select  Invasive or Noninvasive Noninvasive  Adult Vent Y  Vent start date 05/23/24  Vent start time 0136  Adult Ventilator Settings  Vent Type Servo-air  Vent Mode BIPAP;PCV  Set Rate 12 bmp  FiO2 (%) 35 %  Pressure Control 10 cmH20  PEEP 5 cmH20  Adult Ventilator Measurements  Peak Airway Pressure 17 L/min  Mean Airway Pressure 9.5 cmH20  Resp Rate Spontaneous 14 br/min  Resp Rate Total 26 br/min  Exhaled Vt 599 mL  Measured Ve 16.5 L  Auto PEEP 0 cmH20  Total PEEP 5 cmH20  SpO2 100 %  Adult Ventilator Alarms  Alarms On Y  Breath Sounds  Bilateral Breath Sounds Coarse crackles;Diminished  Vent Respiratory Assessment  Patient Effort Fair  Level of Consciousness Alert  Respiratory Pattern Regular;Unlabored;Tachypnea  Respiratory Effort Mild    Patient placed on BiPAP on arrival to ED. Mild respiratory distress noted. No significant signs of ventilatory fatigue/effort, accessory muscle usage or respiratory compromise noted. Shallow respirations noted. Patient is alert and answering questions appropriately. Speaking in unbroken sentences. Noted hypoxia 84-86% once transitioning from EMS NIV mask to hospital provided NIV equipment. Patient is now on NIV tolerating it well; she is on minimal settings to meet systemic and myocardial oxygen  demands.   Laure Leone L. Felipe Horton, BS, RRT-ACCS, RCP

## 2024-05-23 NOTE — ED Notes (Signed)
 Provider updated on pt VS including hypertension and bradycardia, verbal order to hold future beta blocker meds

## 2024-05-23 NOTE — Progress Notes (Signed)
 PT Cancellation Note  Patient Details Name: Beth Blankenship MRN: [id number] DOB: 10/19/1951   Cancelled Treatment:    Reason Eval/Treat Not Completed: Medical issues which prohibited therapy; noted back on bipap this afternoon.  Will follow up when stable.    Marley Simmers 05/23/2024, 5:04 PM Abigail Hoff, PT Acute Rehabilitation Services [phone] 05/23/2024

## 2024-05-23 NOTE — ED Notes (Signed)
 Pt assisted with using a bedpan. Pt sheets and and diaper changed due to incontinence.

## 2024-05-23 NOTE — ED Notes (Signed)
 PT taken off bi-pap per patient request. RT called. Bi pap removed and pt placed on 6l [place]. Pt states she feels a little sob. She wants to eat and then states she will go back on it if she is still feeling sob.

## 2024-05-23 NOTE — ED Provider Notes (Signed)
 [facility] Provider Note   CSN: [id number] Arrival date & time: 05/23/24  0120     History  Chief Complaint  Patient presents with   Shortness of Breath   Level 5 caveat due to acuity of condition Beth Blankenship is a 73 y.o. female.  The history is provided by the patient. The history is limited by the condition of the patient.  Patient w/history of diabetes, hypertension presents with shortness of breath. Patient arrives via EMS for shortness of breath, initially was 80% on room air, patient was placed on CPAP with improvement.  Also given a DuoNeb and nitroglycerin.  Patient has been hypertensive.  Patient reports she is feeling improved  Patient has had a recent subdural but appears to improved    Past Medical History:  Diagnosis Date   Arthritis    Cancer (HCC)    Diabetes mellitus without complication (HCC)    H/O mastectomy    left side   History of breast reconstruction    Hypertension    Stroke (HCC)    mini strokes    Home Medications Prior to Admission medications   Medication Sig Start Date End Date Taking? Authorizing Provider  albuterol  (PROVENTIL  HFA;VENTOLIN  HFA) 108 (90 Base) MCG/ACT inhaler Inhale 2 puffs into the lungs every 6 (six) hours as needed for wheezing or shortness of breath.    [provider]  amLODipine  (NORVASC ) 10 MG tablet Take 1 tablet (10 mg total) by mouth daily. 07/09/17   [place], Betty G, MD  aspirin  EC 81 MG tablet Take 81 mg by mouth daily. Swallow whole.    [provider]  atorvastatin  (LIPITOR) 20 MG tablet Take 1 tablet (20 mg total) by mouth daily. 07/09/17   [place], Betty G, MD  busPIRone  (BUSPAR ) 10 MG tablet Take 1 tablet (10 mg total) by mouth 2 (two) times daily. 03/28/24   Wray Heady, MD  clopidogrel (PLAVIX) 75 MG tablet Take 75 mg by mouth daily. 01/31/24   [provider]  doxazosin  (CARDURA ) 4 MG tablet Take 1 tablet (4 mg total) by mouth 2  (two) times daily. 07/09/17   [place], Betty G, MD  folic acid (FOLVITE) 1 MG tablet Take 1 mg by mouth daily. 03/24/24   [provider]  furosemide  (LASIX ) 40 MG tablet Take 40 mg by mouth daily. 04/07/24   [provider]  HYDROcodone -acetaminophen  (NORCO/VICODIN) 5-325 MG tablet Take 1 tablet by mouth every 4 (four) hours as needed. 04/24/24   Sueellen Emery, MD  hydrOXYzine  (ATARAX ) 10 MG tablet Take 1 tablet (10 mg total) by mouth daily as needed for anxiety. 02/05/24   Wray Heady, MD  lisinopril  (ZESTRIL ) 40 MG tablet Take 40 mg by mouth daily.    [provider]  metoprolol  tartrate (LOPRESSOR ) 100 MG tablet Take 1 tablet (100 mg total) by mouth 2 (two) times daily. 07/09/17   [place], Betty G, MD  ondansetron  (ZOFRAN -ODT) 4 MG disintegrating tablet Take 1 tablet (4 mg total) by mouth every 8 (eight) hours as needed. 04/24/24   Sueellen Emery, MD  paliperidone  (INVEGA ) 3 MG 24 hr tablet Take 1 tablet (3 mg total) by mouth daily. 04/07/24   Wray Heady, MD  potassium chloride  SA (KLOR-CON  M) 20 MEQ tablet Take 20 mEq by mouth 2 (two) times daily. 03/28/24   [provider]  QUEtiapine  (SEROQUEL ) 100 MG tablet Take 1 tablet (100 mg total) by mouth at bedtime. 05/05/24 06/04/24  Hisada,  Ivan Marion, MD  sertraline  (ZOLOFT ) 100 MG tablet Take 1.5 tablets (150 mg total) by mouth daily. 02/05/24   Wray Heady, MD  traZODone  (DESYREL ) 50 MG tablet Take 1 tablet (50 mg total) by mouth at bedtime as needed for sleep. Patient taking differently: Take 50 mg by mouth at bedtime. 02/28/24   Wray Heady, MD      Allergies    Grass pollen(k-o-r-t-swt vern), Latex, and Tetracyclines & related    Review of Systems   Review of Systems  Unable to perform ROS: Acuity of condition    Physical Exam Updated Vital Signs BP (!) 155/72   Pulse (!) 48   Temp 98.4 F (36.9 C) (Oral)   Resp (!) 23   Ht 1.6 m (5\' 3" )   Wt 79.4 kg   SpO2 96%   BMI 31.00 kg/m  Physical  Exam CONSTITUTIONAL: Elderly, no acute distress HEAD: Normocephalic/atraumatic ENMT: Mucous membranes moist, BiPAP mask in place NECK: supple no meningeal signs CV: S1/S2 noted, no murmurs/rubs/gallops noted LUNGS: Crackles bilaterally ABDOMEN: soft, nontender, no rebound or guarding, bowel sounds noted throughout abdomen GU:no cva tenderness NEURO: Pt is awake/alert/appropriate, moves all extremitiesx4.  No facial droop.   EXTREMITIES: pulses normal/equal, full ROM SKIN: warm, color normal PSYCH: no abnormalities of mood noted, alert and oriented to situation  ED Results / Procedures / Treatments   Labs (all labs ordered are listed, but only abnormal results are displayed) Labs Reviewed  CBC - Abnormal; Notable for the following components:      Result Value   Hemoglobin 11.5 (*)    All other components within normal limits  BASIC METABOLIC PANEL WITH GFR - Abnormal; Notable for the following components:   CO2 21 (*)    Glucose, Bld 273 (*)    Creatinine, Ser 1.26 (*)    Calcium  8.4 (*)    GFR, Estimated 45 (*)    All other components within normal limits  BRAIN NATRIURETIC PEPTIDE - Abnormal; Notable for the following components:   B Natriuretic Peptide 916.4 (*)    All other components within normal limits    EKG EKG Interpretation Date/Time:  Friday May 23 2024 01:35:26 EDT Ventricular Rate:  56 PR Interval:  144 QRS Duration:  84 QT Interval:  468 QTC Calculation: 452 R Axis:   50  Text Interpretation: Sinus rhythm Nonspecific T abnormalities, lateral leads Confirmed by Eldon Greenland ([id number]) on 05/23/2024 1:54:04 AM  Radiology DG Chest Port 1 View Result Date: 05/23/2024 CLINICAL DATA:  Shortness of breath EXAM: PORTABLE CHEST 1 VIEW COMPARISON:  Radiograph 02/21/2022 FINDINGS: Stable cardiomediastinal silhouette. Aortic atherosclerotic calcification. Patchy bilateral airspace and interstitial opacities greatest in the left mid and lower lung. Probable small  bilateral pleural effusions. No pneumothorax. IMPRESSION: Patchy bilateral airspace and interstitial opacities greatest in the left mid and lower lung. Findings may be due to edema or infection. Electronically Signed   By: Rozell Cornet M.D.   On: 05/23/2024 01:44    Procedures .Critical Care  Performed by: Eldon Greenland, MD Authorized by: Eldon Greenland, MD   Critical care provider statement:    Critical care time (minutes):  35   Critical care start time:  05/23/2024 2:15 AM   Critical care end time:  05/23/2024 2:50 AM   Critical care time was exclusive of:  Separately billable procedures and treating other patients   Critical care was necessary to treat or prevent imminent or life-threatening deterioration of the following conditions:  Respiratory failure and cardiac failure  Critical care was time spent personally by me on the following activities:  Examination of patient, re-evaluation of patient's condition, pulse oximetry, ordering and review of radiographic studies, ordering and review of laboratory studies, ordering and performing treatments and interventions, development of treatment plan with patient or surrogate, evaluation of patient's response to treatment and obtaining history from patient or surrogate   I assumed direction of critical care for this patient from another provider in my specialty: no     Care discussed with: admitting provider       Medications Ordered in ED Medications  nitroGLYCERIN (NITROGLYN) 2 % ointment 1 inch (1 inch Topical Given 05/23/24 0226)  furosemide  (LASIX ) injection 40 mg (40 mg Intravenous Given 05/23/24 0226)    ED Course/ Medical Decision Making/ A&P Clinical Course as of 05/23/24 0256  Fri May 23, 2024  0206 Glucose(!): 273 Hyperglycemia [DW]  0206 Creatinine(!): 1.26 Renal insufficiency [DW]  0255 Patient presented with increasing shortness of breath but improved with noninvasive ventilation. Imaging and labs are consistent  with acute CHF. Husband now at bedside, confirms story of increasing shortness of breath and swelling.  He does not recall her having these kinds of issues previously [DW]  0256 Discussed with Dr. Del Favia for admission [DW]    Clinical Course User Index [DW] Eldon Greenland, MD                                 Medical Decision Making Amount and/or Complexity of Data Reviewed Labs: ordered. Decision-making details documented in ED Course. Radiology: ordered.  Risk Prescription drug management. Decision regarding hospitalization.   This patient presents to the ED for concern of shortness of breath, this involves an extensive number of treatment options, and is a complaint that carries with it a high risk of complications and morbidity.  The differential diagnosis includes but is not limited to Acute coronary syndrome, pneumonia, acute pulmonary edema, pneumothorax, acute anemia, pulmonary embolism   Comorbidities that complicate the patient evaluation: Patient's presentation is complicated by their history of previous subdural, hypertension  Social Determinants of Health: Patient's poor health literacy  increases the complexity of managing their presentation  Additional history obtained: Records reviewed previous admission documents and Care Everywhere/External Records  Lab Tests: I Ordered, and personally interpreted labs.  The pertinent results include: Elevated BNP consistent with CHF  Imaging Studies ordered: I ordered imaging studies including X-ray chest  I independently visualized and interpreted imaging which showed pulmonary edema I agree with the radiologist interpretation  Cardiac Monitoring: The patient was maintained on a cardiac monitor.  I personally viewed and interpreted the cardiac monitor which showed an underlying rhythm of:  sinus rhythm  Medicines ordered and prescription drug management: I ordered medication including nitroglycerin and Lasix  for  CHF Reevaluation of the patient after these medicines showed that the patient    stayed the same   Critical Interventions:   noninvasive ventilation, nitroglycerin and Lasix   Consultations Obtained: I requested consultation with the admitting physician [facility], and discussed  findings as well as pertinent plan - they recommend: Admit  Reevaluation: After the interventions noted above, I reevaluated the patient and found that they have :improved  Complexity of problems addressed: Patient's presentation is most consistent with  acute presentation with potential threat to life or bodily function  Disposition: After consideration of the diagnostic results and the patient's response to treatment,  I feel that the patent would benefit  from admission  .           Final Clinical Impression(s) / ED Diagnoses Final diagnoses:  Acute respiratory failure with hypoxia (HCC)  Acute congestive heart failure, unspecified heart failure type [facility])    Rx / DC Orders ED Discharge Orders     None         Eldon Greenland, MD 05/23/24 [phone]

## 2024-05-23 NOTE — TOC CM/SW Note (Addendum)
 Transition of Care [facility]) - Inpatient Brief Assessment   Patient Details  Name: Beth Blankenship MRN: [id number] Date of Birth: 07-26-1951  Transition of Care [facility]) CM/SW Contact:    Arron Big, LCSWA Phone Number: 05/23/2024, 4:00 PM   Clinical Narrative: Patient lives at home with husband and he can provide transportation at DC, if applicable. Patient reports she has a PCP and uses [facility]. Patient reports having the following DME: walker and a wheelchair. Patient has hx of HH services with [facility]. Patient reports prior hx of SNF placement, per [facility] pt has been to [facility].   TOC will continue to follow as needed.    Transition of Care Asessment: Insurance and Status: Insurance coverage has been reviewed Patient has primary care physician: Yes Home environment has been reviewed: Home with husband Prior level of function:: Independent Prior/Current Home Services: No current home services Social Drivers of Health Review: SDOH reviewed no interventions necessary Readmission risk has been reviewed: No Transition of care needs: no transition of care needs at this time

## 2024-05-23 NOTE — ED Notes (Signed)
 PT states she is having trouble breathing, Her RR has increased and her O2 was dropping to the 80's. RT called and pt will be placed back on bi-pap.

## 2024-05-23 NOTE — ED Notes (Signed)
 Pt placed back on bipap

## 2024-05-23 NOTE — Plan of Care (Signed)
  Problem: Fluid Volume: Goal: Ability to maintain a balanced intake and output will improve Outcome: Progressing   Problem: Health Behavior/Discharge Planning: Goal: Ability to manage health-related needs will improve Outcome: Progressing   Problem: Metabolic: Goal: Ability to maintain appropriate glucose levels will improve Outcome: Progressing   Problem: Nutritional: Goal: Progress toward achieving an optimal weight will improve Outcome: Progressing

## 2024-05-24 ENCOUNTER — Inpatient Hospital Stay (HOSPITAL_COMMUNITY)

## 2024-05-24 DIAGNOSIS — I5033 Acute on chronic diastolic (congestive) heart failure: Secondary | ICD-10-CM | POA: Diagnosis not present

## 2024-05-24 LAB — GLUCOSE, CAPILLARY
Glucose-Capillary: 212 mg/dL — ABNORMAL HIGH (ref 70–99)
Glucose-Capillary: 224 mg/dL — ABNORMAL HIGH (ref 70–99)
Glucose-Capillary: 208 mg/dL — ABNORMAL HIGH (ref 70–99)
Glucose-Capillary: 278 mg/dL — ABNORMAL HIGH (ref 70–99)

## 2024-05-24 LAB — BASIC METABOLIC PANEL WITH GFR
Anion gap: 12 (ref 5–15)
BUN: 15 mg/dL (ref 8–23)
CO2: 27 mmol/L (ref 22–32)
Calcium: 8.8 mg/dL — ABNORMAL LOW (ref 8.9–10.3)
Chloride: 103 mmol/L (ref 98–111)
Creatinine, Ser: 1.12 mg/dL — ABNORMAL HIGH (ref 0.44–1.00)
GFR, Estimated: 52 mL/min — ABNORMAL LOW (ref >60)
Glucose, Bld: 259 mg/dL — ABNORMAL HIGH (ref 70–99)
Potassium: 3.8 mmol/L (ref 3.5–5.1)
Sodium: 142 mmol/L (ref 135–145)

## 2024-05-24 LAB — LIPID PANEL
Cholesterol: 217 mg/dL — ABNORMAL HIGH (ref 0–200)
HDL: 44 mg/dL (ref >40)
LDL Cholesterol: 138 mg/dL — ABNORMAL HIGH (ref 0–99)
Total CHOL/HDL Ratio: 4.9 ratio
Triglycerides: 176 mg/dL — ABNORMAL HIGH (ref <150)
VLDL: 35 mg/dL (ref 0–40)

## 2024-05-24 LAB — CBC
HCT: 37 % (ref 36.0–46.0)
Hemoglobin: 11.7 g/dL — ABNORMAL LOW (ref 12.0–15.0)
MCH: 28.1 pg (ref 26.0–34.0)
MCHC: 31.6 g/dL (ref 30.0–36.0)
MCV: 88.9 fL (ref 80.0–100.0)
Platelets: 229 10*3/uL (ref 150–400)
RBC: 4.16 MIL/uL (ref 3.87–5.11)
RDW: 14.3 % (ref 11.5–15.5)
WBC: 7.4 10*3/uL (ref 4.0–10.5)
nRBC: 0 % (ref 0.0–0.2)

## 2024-05-24 LAB — VITAMIN B12: Vitamin B-12: 316 pg/mL (ref 180–914)

## 2024-05-24 LAB — PHOSPHORUS: Phosphorus: 3.5 mg/dL (ref 2.5–4.6)

## 2024-05-24 LAB — MAGNESIUM: Magnesium: 1.8 mg/dL (ref 1.7–2.4)

## 2024-05-24 LAB — VITAMIN D 25 HYDROXY (VIT D DEFICIENCY, FRACTURES): Vit D, 25-Hydroxy: 17.48 ng/mL — ABNORMAL LOW (ref 30–100)

## 2024-05-24 MED ORDER — ISOSORBIDE MONONITRATE ER 60 MG PO TB24
60.0000 mg | ORAL_TABLET | Freq: Every evening | ORAL | Status: DC
  Administered 2024-05-24 – 2024-05-30 (×7): 60 mg via ORAL
  Filled 2024-05-24 (×7): qty 1

## 2024-05-24 MED ORDER — HYDRALAZINE HCL 20 MG/ML IJ SOLN
10.0000 mg | Freq: Four times a day (QID) | INTRAMUSCULAR | Status: DC | PRN
  Administered 2024-05-25: 10 mg via INTRAVENOUS
  Filled 2024-05-24 (×2): qty 1

## 2024-05-24 MED ORDER — FUROSEMIDE 10 MG/ML IJ SOLN
40.0000 mg | Freq: Every day | INTRAMUSCULAR | Status: DC
  Administered 2024-05-24 – 2024-05-25 (×2): 40 mg via INTRAVENOUS
  Filled 2024-05-24 (×2): qty 4

## 2024-05-24 MED ORDER — HYDRALAZINE HCL 50 MG PO TABS
50.0000 mg | ORAL_TABLET | Freq: Four times a day (QID) | ORAL | Status: DC | PRN
  Administered 2024-05-24: 50 mg via ORAL
  Filled 2024-05-24: qty 1

## 2024-05-24 NOTE — Plan of Care (Signed)
  Problem: Nutrition: Goal: Adequate nutrition will be maintained Outcome: Completed/Met   Problem: Elimination: Goal: Will not experience complications related to bowel motility Outcome: Completed/Met Goal: Will not experience complications related to urinary retention Outcome: Completed/Met   Problem: Pain Managment: Goal: General experience of comfort will improve and/or be controlled Outcome: Completed/Met

## 2024-05-24 NOTE — Evaluation (Addendum)
 Occupational Therapy Evaluation Patient Details Name: Beth Blankenship MRN: [id number] DOB: 07/26/1951 Today's Date: 05/24/2024   History of Present Illness   Pt is a 73 y/o F presenting to ED on 5/30 wtih SOB, admitted for acute CHF exacerbation. PMH includes HTN, CVA, breast CA p/w SOB, elevated BNP     Clinical Impressions Pt reports having assist at baseline with all ADLs, and pivots to w/c for mobility. Pt lives with spouse and daughter's who assist her. Pt on 4L O2 baseline, needs 6L this session for x3 pivot transfer to bsc and then to chair. Pt needing up to max A for ADLs, min A for bed mobility and min A for transfers with RW. Pt presenting with impairments listed below, will follow acutely. Recommend HHOT at d/c given pt continues to have 24/7 assist at home, if unable, will benefit from postacute rehab at d/c.      If plan is discharge home, recommend the following:   A little help with walking and/or transfers;A lot of help with bathing/dressing/bathroom;Assistance with cooking/housework;Direct supervision/assist for medications management;Direct supervision/assist for financial management;Assist for transportation;Help with stairs or ramp for entrance     Functional Status Assessment   Patient has had a recent decline in their functional status and demonstrates the ability to make significant improvements in function in a reasonable and predictable amount of time.     Equipment Recommendations   BSC/3in1     Recommendations for Other Services   PT consult     Precautions/Restrictions   Precautions Precautions: Fall Precaution/Restrictions Comments: 4L O2 baseline Restrictions Weight Bearing Restrictions Per Provider Order: No     Mobility Bed Mobility Overal bed mobility: Needs Assistance Bed Mobility: Supine to Sit     Supine to sit: Min assist     General bed mobility comments: cues to scoot toward [facility]    Transfers Overall transfer  level: Needs assistance Equipment used: Rolling walker (2 wheels) Transfers: Sit to/from Stand Sit to Stand: Min assist           General transfer comment: pt leaning posteriorly, bracing agaist bed/chair to bring self into standing      Balance Overall balance assessment: Needs assistance Sitting-balance support: Feet supported Sitting balance-Leahy Scale: Fair     Standing balance support: During functional activity, Reliant on assistive device for balance Standing balance-Leahy Scale: Poor                             ADL either performed or assessed with clinical judgement   ADL Overall ADL's : Needs assistance/impaired Eating/Feeding: Set up;Sitting   Grooming: Set up;Sitting   Upper Body Bathing: Sitting;Moderate assistance   Lower Body Bathing: Sitting/lateral leans;Maximal assistance   Upper Body Dressing : Minimal assistance   Lower Body Dressing: Maximal assistance   Toilet Transfer: Minimal assistance;Stand-pivot;Rolling walker (2 wheels);BSC/3in1   Toileting- Clothing Manipulation and Hygiene: Maximal assistance;Sitting/lateral lean;Sit to/from stand       Functional mobility during ADLs: Minimal assistance;Rolling walker (2 wheels)       Vision   Vision Assessment?: No apparent visual deficits     Perception Perception: Not tested       Praxis Praxis: Not tested       Pertinent Vitals/Pain Pain Assessment Pain Assessment: No/denies pain     Extremity/Trunk Assessment Upper Extremity Assessment Upper Extremity Assessment: Generalized weakness;Left hand dominant;LUE deficits/detail LUE Deficits / Details: LUE with intermitting "cramping", lasts ~1 min LUE Coordination:  decreased gross motor;decreased fine motor   Lower Extremity Assessment Lower Extremity Assessment: Defer to PT evaluation   Cervical / Trunk Assessment Cervical / Trunk Assessment: Kyphotic   Communication Communication Communication: No apparent  difficulties   Cognition Arousal: Alert Behavior During Therapy: WFL for tasks assessed/performed, Flat affect Cognition: No apparent impairments                               Following commands: Intact       Cueing  General Comments   Cueing Techniques: Verbal cues;Gestural cues  VSS on 6L O2   Exercises     Shoulder Instructions      Home Living Family/patient expects to be discharged to:: Private residence Living Arrangements: Children;Spouse/significant other (daughters) Available Help at Discharge: Family;Available 24 hours/day Type of Home: House Home Access: Stairs to enter [employer] of Steps: 1   Home Layout: One level     Bathroom Shower/Tub: Tub/shower unit         Home Equipment: Agricultural consultant (2 wheels);Shower seat;Wheelchair - manual          Prior Functioning/Environment Prior Level of Function : Needs assist             Mobility Comments: assist from spouse for pivot transfers to w/c, spouse propels w/c from bedroom to living room ADLs Comments: assist for ADLs, bathes seated, wears briefs, assist for self feeding PRN    OT Problem List: Decreased strength;Decreased range of motion;Decreased activity tolerance;Impaired balance (sitting and/or standing);Decreased cognition;Cardiopulmonary status limiting activity   OT Treatment/Interventions: Self-care/ADL training;Therapeutic exercise;Energy conservation;DME and/or AE instruction;Therapeutic activities;Patient/family education;Balance training      OT Goals(Current goals can be found in the care plan section)   Acute Rehab OT Goals Patient Stated Goal: none stated OT Goal Formulation: With patient Time For Goal Achievement: 06/07/24 Potential to Achieve Goals: Good ADL Goals Pt Will Perform Grooming: with set-up;sitting;bed level Pt Will Perform Upper Body Dressing: with contact guard assist;sitting Pt Will Perform Lower Body Dressing: with min  assist;sitting/lateral leans;sit to/from stand Pt Will Transfer to Toilet: ambulating;bedside commode;with supervision Pt Will Perform Tub/Shower Transfer: Squat pivot transfer;Stand pivot transfer;shower seat;Shower transfer   OT Frequency:  Min 1X/week    Co-evaluation              AM-PAC OT "6 Clicks" Daily Activity     Outcome Measure Help from another person eating meals?: A Little Help from another person taking care of personal grooming?: A Little Help from another person toileting, which includes using toliet, bedpan, or urinal?: A Lot Help from another person bathing (including washing, rinsing, drying)?: A Lot Help from another person to put on and taking off regular upper body clothing?: A Little Help from another person to put on and taking off regular lower body clothing?: A Lot 6 Click Score: 15   End of Session Equipment Utilized During Treatment: Gait belt;Rolling walker (2 wheels);Oxygen  Nurse Communication: Mobility status  Activity Tolerance: Patient tolerated treatment well Patient left: in chair;with call bell/phone within reach;with chair alarm set;with family/visitor present  OT Visit Diagnosis: Unsteadiness on feet (R26.81);Other abnormalities of gait and mobility (R26.89);Muscle weakness (generalized) (M62.81)                Time: 4034-7425 OT Time Calculation (min): 32 min Charges:  OT General Charges $OT Visit: 1 Visit OT Evaluation $OT Eval Moderate Complexity: 1 Mod OT Treatments $Self Care/Home Management : 8-22  mins  Ashlynne Shetterly K, OTD, OTR/L SecureChat Preferred Acute Rehab [phone]   Antionette Kirks 05/24/2024, 10:50 AM

## 2024-05-24 NOTE — Evaluation (Signed)
 Physical Therapy Evaluation Patient Details Name: Beth Blankenship MRN: [id number] DOB: [birth year] Today's Date: 05/24/2024  History of Present Illness  Pt is a 73 y/o F presenting to ED on 5/30 wtih SOB, admitted for acute CHF exacerbation. PMH includes HTN, CVA, breast CA p/w SOB, elevated BNP  Clinical Impression  Pt admitted with/for CHF.  Pt needing light mod to heavy minimal assist overall.  Dtr relates that it is somewhat difficult at home and that she has not improved with HHPT at 1x/week.  I suggested OPPT or even better, Skill PT/OT if pt agreed.  Pt currently limited functionally due to the problems listed. ( See problems list.)   Pt will benefit from PT to maximize function and safety in order to get ready for next venue listed below.         If plan is discharge home, recommend the following: A lot of help with bathing/dressing/bathroom;Assistance with cooking/housework;Direct supervision/assist for medications management;Assist for transportation;A lot of help with walking and/or transfers   Can travel by private vehicle   No    Equipment Recommendations Other (comment)  Recommendations for Other Services       Functional Status Assessment Patient has had a recent decline in their functional status and demonstrates the ability to make significant improvements in function in a reasonable and predictable amount of time.     Precautions / Restrictions Precautions Precautions: Fall Precaution/Restrictions Comments: 4L O2 baseline Restrictions Weight Bearing Restrictions Per Provider Order: No      Mobility  Bed Mobility               General bed mobility comments: up in the chair on arrival    Transfers Overall transfer level: Needs assistance Equipment used: Rolling walker (2 wheels) Transfers: Sit to/from Stand Sit to Stand: Min assist, Mod assist           General transfer comment: Assisted pt to come forward and push up on the armrests, but she  immediately reaches for the RW and begins a hard push back against the assist given by therapist instead of coming forward over toes.  Transfers are by a forced, rapid step that is almost out of control given L foot up on her toes.    Ambulation/Gait               General Gait Details: pivot transfer with RW only.  Pt needing lots of control staying forward and to control for quick unstead steps.  Stairs            Wheelchair Mobility     Tilt Bed    Modified Rankin (Stroke Patients Only)       Balance Overall balance assessment: Needs assistance Sitting-balance support: Feet supported Sitting balance-Leahy Scale: Fair     Standing balance support: During functional activity, Reliant on assistive device for balance Standing balance-Leahy Scale: Poor                               Pertinent Vitals/Pain      Home Living Family/patient expects to be discharged to:: Private residence Living Arrangements: Children;Spouse/significant other (daughters) Available Help at Discharge: Family;Available 24 hours/day Type of Home: House Home Access: Stairs to enter   [employer] of Steps: 1   Home Layout: One level Home Equipment: Agricultural consultant (2 wheels);Shower seat;Wheelchair - manual      Prior Function Prior Level of Function : Needs assist  Mobility Comments: assist from spouse for pivot transfers to w/c, spouse propels w/c from bedroom to living room ADLs Comments: assist for ADLs, bathes seated, wears briefs, assist for self feeding PRN     Extremity/Trunk Assessment   Upper Extremity Assessment LUE Deficits / Details: LUE with intermitting "cramping", lasts ~1 min LUE Coordination: decreased gross motor;decreased fine motor    Lower Extremity Assessment Lower Extremity Assessment: Generalized weakness;LLE deficits/detail (bil weakness) LLE Deficits / Details: L LE resting stated 0-40 degrees flexion, pt can P/AA  range to ~90 degrees.  L ankle stays in ~ 25 degrees of plantar flexion even in w/bearing on toes.  Difficult to ge heel toward neutral alignment.   Otherwise bil weak at >=3/5    Cervical / Trunk Assessment Cervical / Trunk Assessment: Kyphotic  Communication   Communication Communication: No apparent difficulties    Cognition Arousal: Alert Behavior During Therapy: WFL for tasks assessed/performed, Flat affect                             Following commands: Intact       Cueing Cueing Techniques: Verbal cues, Gestural cues     General Comments General comments (skin integrity, edema, etc.): VSS on 6L O2    Exercises     Assessment/Plan    PT Assessment Patient needs continued PT services  PT Problem List Decreased strength;Decreased range of motion;Decreased activity tolerance;Decreased balance;Decreased mobility;Decreased coordination;Decreased knowledge of use of DME       PT Treatment Interventions DME instruction;Gait training;Stair training;Functional mobility training;Therapeutic activities;Therapeutic exercise;Neuromuscular re-education;Cognitive remediation;Balance training;Patient/family education    PT Goals (Current goals can be found in the Care Plan section)  Acute Rehab PT Goals Patient Stated Goal: get home soon PT Goal Formulation: With patient Time For Goal Achievement: 06/06/24 Potential to Achieve Goals: Fair    Frequency Min 3X/week     Co-evaluation               AM-PAC PT "6 Clicks" Mobility  Outcome Measure Help needed turning from your back to your side while in a flat bed without using bedrails?: A Lot Help needed moving from lying on your back to sitting on the side of a flat bed without using bedrails?: A Lot Help needed moving to and from a bed to a chair (including a wheelchair)?: A Little Help needed standing up from a chair using your arms (e.g., wheelchair or bedside chair)?: A Lot Help needed to walk in hospital  room?: A Lot Help needed climbing 3-5 steps with a railing? : A Lot 6 Click Score: 13    End of Session   Activity Tolerance: Patient limited by fatigue Patient left: Other (comment) (transport chair to CT) Nurse Communication: Mobility status;Precautions PT Visit Diagnosis: Muscle weakness (generalized) (M62.81);Difficulty in walking, not elsewhere classified (R26.2);Other symptoms and signs involving the nervous system (R29.898)    Time: 6578-4696 PT Time Calculation (min) (ACUTE ONLY): 39 min   Charges:   PT Evaluation $PT Eval Moderate Complexity: 1 Mod PT Treatments $Therapeutic Activity: 23-37 mins PT General Charges $$ ACUTE PT VISIT: 1 Visit         05/24/2024  Nohemi Batters., PT Acute Rehabilitation Services [phone]  (office)  Durell Gilding Mansel Strother 05/24/2024, 2:58 PM

## 2024-05-24 NOTE — Discharge Instructions (Signed)

## 2024-05-24 NOTE — Progress Notes (Signed)
 [facility] Progress Note  Patient: Beth Blankenship    ZOX:[id number]  DOA: 05/23/2024     Date of Service: the patient was seen and examined on 05/24/2024  Chief Complaint  Patient presents with   Shortness of Breath   Brief hospital course: Beth Blankenship is a 73 y.o. female with medical history significant of HTN, CVA, and breast cancer p/w SOB, and elevated BNP c/w HFpEF exacerbation.   Pt was in her USOH until 1600 yesterday she experienced SOB while watching television. She turned up her home O2 from 4L [place] to 5L [place] and had mild to no improvement. She continued throughout the day with SOB that worsened with mild activity like getting up to go to the restroom. She became increasingly concerned when she went to bed at 2300 and was unable to lay flat; in fact, she felt like she was "drowning" and activated EMS. Of note, the patient reports eating out multiple times a week and does not adhere to a low salt diet.   In the ED, pt bradycardic and hypertensive. Labs notable for Cr 1.36 (baseline 0.8) and BNP 916. CXR with b/l pulmonary edema. Pt admitted to medicine for diuresis.   Assessment and Plan: # HFpEF exacerbation # Elevated BNP # SOB/DOE -RD consulted for low sodium diet education; apprec eval/recs -IV lasix  40mg  BID for now; goal net neg 1-2L/d; strict I/Os; daily standing weights; K>4/Mg>2 -PTA metoprolol  100mg  BID and ASA 81mg  daily -Consider increasing pta diuretics on discharge TTE LVEF 66 5%, moderately decreased RV systolic function, severe PAH    # Severe pulmonary hypertension  RVSP 86.3mmHg; presumably related to volume overload from above -Diuresis per above - Recommended to follow with pulmonologist as an outpatient for PFTs to exclude COPD vs sleep study to rule out OSA    H/o HTN -PTA amlodipine    H/o DM2 -SSI TID AC prn    H/o CVA -PTA aspirin  and plavix    H/o Subarachnoid hemorrhage and subdural hematoma s/p fall on 5/1 5/1 CT Head: 1.  Small volume of right frontal convexity subarachnoid hemorrhage is mildly increased.  2. No substantial change in size of a right cerebral convexity subdural hematoma which measures up to approximately 5 mm in thickness. No midline shift.  5/31 CT Head: 1. Interval decrease in size of right frontoparietal convexity subdural hemorrhage, now measuring up to 3 mm. 2. Interval resolution of the previously seen subarachnoid hemorrhage along the right frontal lobe. 3. Stable background of chronic small vessel disease with old infarcts in the superior right cerebellar hemisphere and medial left occipital lobe.   H/o unspecified schizophrenia -PTA Invega  3mg  daily, buspar  and seroquel    Body mass index is 31.83 kg/m.  Interventions:  Diet: Diabetic diet DVT Prophylaxis: Subcutaneous Lovenox    Advance goals of care discussion: Full code  Family Communication: family was present at bedside, at the time of interview.  The pt provided permission to discuss medical plan with the family. Opportunity was given to ask question and all questions were answered satisfactorily.   Disposition:  Pt is from Home, admitted with CHF exacerbation, still has shortness of breath on IV Lasix , which precludes a safe discharge. Discharge to home, when stable, may need 1-2 more days to improve.  Subjective: No significant events overnight, patient feels improvement in the shortness of breath, denied any chest pain or palpitations. Later on she mentioned right hand numbness to the RN, CT head reviewed, negative for any acute findings.  Physical Exam: General:  NAD, lying comfortably Appear in no distress, affect appropriate Eyes: PERRLA ENT: Oral Mucosa Clear, moist  Neck: no JVD,  Cardiovascular: S1 and S2 Present, no Murmur,  Respiratory: Equal air entry bilaterally, mild bibasilar crackles, no wheezes.   Abdomen: Bowel Sound present, Soft and no tenderness,  Skin: no rashes Extremities: Mild 1-2+ pedal  edema, no calf tenderness Neurologic: without any new focal findings Gait not checked due to patient safety concerns  Vitals:   05/24/24 0814 05/24/24 1055 05/24/24 1100 05/24/24 1200  BP: (!) 177/98 (!) 169/68 (!) 168/67 (!) 144/66  Pulse: 60 (!) 59 (!) 57 60  Resp:    19  Temp: 98.7 F (37.1 C)  98.5 F (36.9 C)   TempSrc: Axillary  Axillary   SpO2:   98% 100%  Weight:      Height:        Intake/Output Summary (Last 24 hours) at 05/24/2024 1510 Last data filed at 05/24/2024 1200 Gross per 24 hour  Intake 360 ml  Output 1275 ml  Net -915 ml   Filed Weights   05/23/24 0129 05/23/24 1534 05/24/24 0435  Weight: 79.4 kg 82.4 kg 81.5 kg    Data Reviewed: I have personally reviewed and interpreted daily labs, tele strips, imagings as discussed above. I reviewed all nursing notes, pharmacy notes, vitals, pertinent old records I have discussed plan of care as described above with RN and patient/family.  CBC: Recent Labs  Lab 05/23/24 0130 05/23/24 0430 05/24/24 0920  WBC 7.9 8.0 7.4  HGB 11.5* 11.2* 11.7*  HCT 37.5 36.4 37.0  MCV 89.1 89.7 88.9  PLT 244 231 229   Basic Metabolic Panel: Recent Labs  Lab 05/23/24 0130 05/23/24 0430 05/24/24 0920  NA 139 139 142  K 3.5 3.9 3.8  CL 107 109 103  CO2 21* 21* 27  GLUCOSE 273* 262* 259*  BUN 21 21 15   CREATININE 1.26* 1.36* 1.12*  CALCIUM  8.4* 8.3* 8.8*  MG  --  1.8 1.8  PHOS  --  4.0 3.5    Studies: CT HEAD WO CONTRAST ( ) Result Date: 05/24/2024 EXAM: CT HEAD WITHOUT 05/24/2024 02:29:23 PM TECHNIQUE: CT of the head was performed without the administration of intravenous contrast. Automated exposure control, iterative reconstruction, and/or weight based adjustment of the mA/kV was utilized to reduce the radiation dose to as low as reasonably achievable. COMPARISON: 05/05/2024 CLINICAL HISTORY: Subdural hematoma. FINDINGS: BRAIN AND VENTRICLES: Interval decrease in size of right frontoparietal convexity subdural  hemorrhage, now measuring up to 3 mm on coronal image 30 series 5. Interval resolution of the previously seen subarachnoid hemorrhage along the right frontal lobe. Stable background of chronic small vessel disease with old infarcts in the superior right cerebellar hemisphere and medial left occipital lobe. Gray-white differentiation is otherwise preserved. No mass effect or midline shift. No hydrocephalus. ORBITS: No acute abnormality. SINUSES AND MASTOIDS: No acute abnormality. SOFT TISSUES AND SKULL: Decreased size of the right frontal scalp hematoma. No acute skull fracture. IMPRESSION: 1. Interval decrease in size of right frontoparietal convexity subdural hemorrhage, now measuring up to 3 mm. 2. Interval resolution of the previously seen subarachnoid hemorrhage along the right frontal lobe. 3. Stable background of chronic small vessel disease with old infarcts in the superior right cerebellar hemisphere and medial left occipital lobe. Electronically signed by: Audra Blend MD 05/24/2024 03:07 PM EDT RP Workstation: [id number]    Scheduled Meds:  amLODipine   10 mg Oral Daily   aspirin  EC  81 mg Oral  Daily   busPIRone   10 mg Oral BID   clopidogrel   75 mg Oral Daily   doxazosin   4 mg Oral BID   enoxaparin  (LOVENOX ) injection  40 mg Subcutaneous Daily   furosemide   40 mg Intravenous Daily   insulin  aspart  0-5 Units Subcutaneous QHS   insulin  aspart  0-9 Units Subcutaneous TID WC   isosorbide mononitrate  60 mg Oral QPM   metoprolol  tartrate  100 mg Oral BID   paliperidone   3 mg Oral Daily   QUEtiapine   100 mg Oral QHS   Continuous Infusions: PRN Meds: acetaminophen , hydrALAZINE  **OR** hydrALAZINE , hydrOXYzine , ipratropium-albuterol , melatonin, ondansetron , polyethylene glycol, prochlorperazine   Time spent: 55 minutes  Author: Althia Atlas. MD [facility] 05/24/2024 3:10 PM  To reach On-call, see care teams to locate the attending and reach out to them via [email or web address]. If  7PM-7AM, please contact night-coverage If you still have difficulty reaching the attending provider, please page the [facility] (Director on Call) for [facility] on amion for assistance.

## 2024-05-24 NOTE — Progress Notes (Signed)
 Nutrition Education Note  RD consulted for nutrition education regarding CHF.  Per MD notes, pt does not follow a low sodium diet PTA.   Pt unavailable at time of visit. Attempted to speak with pt via call to hospital room phone, however, unable to reach. RD unable to obtain further nutrition-related history or complete nutrition-focused physical exam at this time.    RD provided "Low Sodium Nutrition Therapy" handout from the Academy of [employer]. Attached to AVS/ discharge summary.   Body mass index is 31.83 kg/m. Pt meets criteria for obesity, class I based on current BMI. Obesity is a complex, chronic medical condition that is optimally managed by a multidisciplinary care team. Weight loss is not an ideal goal for an acute inpatient hospitalization. However, if further work-up for obesity is warranted, consider outpatient referral to [place]. RD referred to [facility] Nutrition [facility] Diabetes [facility] for further support and reinforcement.   Current diet order is 2 gram sodium with 1.5 L fluid restriction, patient is consuming approximately 100% of meals at this time. Labs and medications reviewed. No further nutrition interventions warranted at this time. RD contact information provided. If additional nutrition issues arise, please re-consult RD.   Herschel Lords, RD, LDN, CDCES Registered Dietitian III Certified Diabetes Care and Education Specialist If unable to reach this RD, please use "RD Inpatient" group chat on secure chat between hours of 8am-4 pm daily

## 2024-05-24 NOTE — Plan of Care (Signed)
  Problem: Coping: Goal: Ability to adjust to condition or change in health will improve Outcome: Progressing   Problem: Fluid Volume: Goal: Ability to maintain a balanced intake and output will improve Outcome: Progressing   Problem: Health Behavior/Discharge Planning: Goal: Ability to identify and utilize available resources and services will improve Outcome: Progressing Goal: Ability to manage health-related needs will improve Outcome: Progressing   Problem: Nutritional: Goal: Maintenance of adequate nutrition will improve Outcome: Progressing   Problem: Tissue Perfusion: Goal: Adequacy of tissue perfusion will improve Outcome: Progressing

## 2024-05-25 ENCOUNTER — Inpatient Hospital Stay (HOSPITAL_COMMUNITY)

## 2024-05-25 DIAGNOSIS — R569 Unspecified convulsions: Secondary | ICD-10-CM | POA: Diagnosis not present

## 2024-05-25 DIAGNOSIS — I5033 Acute on chronic diastolic (congestive) heart failure: Secondary | ICD-10-CM | POA: Diagnosis not present

## 2024-05-25 LAB — CBC
HCT: 34.3 % — ABNORMAL LOW (ref 36.0–46.0)
Hemoglobin: 10.7 g/dL — ABNORMAL LOW (ref 12.0–15.0)
MCH: 27.4 pg (ref 26.0–34.0)
MCHC: 31.2 g/dL (ref 30.0–36.0)
MCV: 87.9 fL (ref 80.0–100.0)
Platelets: 205 10*3/uL (ref 150–400)
RBC: 3.9 MIL/uL (ref 3.87–5.11)
RDW: 14.4 % (ref 11.5–15.5)
WBC: 6.9 10*3/uL (ref 4.0–10.5)
nRBC: 0 % (ref 0.0–0.2)

## 2024-05-25 LAB — GLUCOSE, CAPILLARY
Glucose-Capillary: 184 mg/dL — ABNORMAL HIGH (ref 70–99)
Glucose-Capillary: 204 mg/dL — ABNORMAL HIGH (ref 70–99)
Glucose-Capillary: 263 mg/dL — ABNORMAL HIGH (ref 70–99)
Glucose-Capillary: 182 mg/dL — ABNORMAL HIGH (ref 70–99)

## 2024-05-25 LAB — BASIC METABOLIC PANEL WITH GFR
Anion gap: 8 (ref 5–15)
BUN: 20 mg/dL (ref 8–23)
CO2: 28 mmol/L (ref 22–32)
Calcium: 8.7 mg/dL — ABNORMAL LOW (ref 8.9–10.3)
Chloride: 103 mmol/L (ref 98–111)
Creatinine, Ser: 1.3 mg/dL — ABNORMAL HIGH (ref 0.44–1.00)
GFR, Estimated: 44 mL/min — ABNORMAL LOW (ref >60)
Glucose, Bld: 166 mg/dL — ABNORMAL HIGH (ref 70–99)
Potassium: 3.1 mmol/L — ABNORMAL LOW (ref 3.5–5.1)
Sodium: 139 mmol/L (ref 135–145)

## 2024-05-25 LAB — PHOSPHORUS: Phosphorus: 4.7 mg/dL — ABNORMAL HIGH (ref 2.5–4.6)

## 2024-05-25 LAB — MAGNESIUM: Magnesium: 1.8 mg/dL (ref 1.7–2.4)

## 2024-05-25 MED ORDER — THIAMINE MONONITRATE 100 MG PO TABS
100.0000 mg | ORAL_TABLET | Freq: Every day | ORAL | Status: DC
  Administered 2024-05-25 – 2024-05-30 (×6): 100 mg via ORAL
  Filled 2024-05-25 (×6): qty 1

## 2024-05-25 MED ORDER — FUROSEMIDE 10 MG/ML IJ SOLN: 20.0000 mg | Freq: Every day | INTRAMUSCULAR | Status: DC

## 2024-05-25 MED ORDER — POTASSIUM CHLORIDE CRYS ER 20 MEQ PO TBCR
40.0000 meq | EXTENDED_RELEASE_TABLET | Freq: Once | ORAL | Status: AC
  Administered 2024-05-25: 40 meq via ORAL
  Filled 2024-05-25: qty 2

## 2024-05-25 MED ORDER — VITAMIN B-12 1000 MCG PO TABS
1000.0000 ug | ORAL_TABLET | Freq: Every day | ORAL | Status: DC
  Administered 2024-05-28 – 2024-05-30 (×3): 1000 ug via ORAL
  Filled 2024-05-25 (×3): qty 1

## 2024-05-25 MED ORDER — VITAMIN D (ERGOCALCIFEROL) 1.25 MG (50000 UNIT) PO CAPS
50000.0000 [IU] | ORAL_CAPSULE | ORAL | Status: DC
  Administered 2024-05-25: 50000 [IU] via ORAL
  Filled 2024-05-25: qty 1

## 2024-05-25 MED ORDER — CYANOCOBALAMIN 1000 MCG/ML IJ SOLN
1000.0000 ug | Freq: Every day | INTRAMUSCULAR | Status: AC
  Administered 2024-05-25 – 2024-05-27 (×3): 1000 ug via INTRAMUSCULAR
  Filled 2024-05-25 (×3): qty 1

## 2024-05-25 NOTE — Progress Notes (Signed)
 Patient currently on 5L [place] tolerating well, BIPAP not needed at this time.

## 2024-05-25 NOTE — Plan of Care (Signed)
  Problem: Coping: Goal: Ability to adjust to condition or change in health will improve Outcome: Progressing   Problem: Fluid Volume: Goal: Ability to maintain a balanced intake and output will improve Outcome: Progressing   Problem: Health Behavior/Discharge Planning: Goal: Ability to manage health-related needs will improve Outcome: Progressing   Problem: Metabolic: Goal: Ability to maintain appropriate glucose levels will improve Outcome: Progressing   Problem: Nutritional: Goal: Maintenance of adequate nutrition will improve Outcome: Progressing   Problem: Tissue Perfusion: Goal: Adequacy of tissue perfusion will improve Outcome: Progressing

## 2024-05-25 NOTE — Plan of Care (Signed)
   Problem: Tissue Perfusion: Goal: Adequacy of tissue perfusion will improve Outcome: Progressing

## 2024-05-25 NOTE — Progress Notes (Signed)
 EEG complete - results pending

## 2024-05-25 NOTE — Progress Notes (Signed)
 [facility] Progress Note  Patient: Beth Blankenship    MHD:[id number]  DOA: 05/23/2024     Date of Service: the patient was seen and examined on 05/25/2024  Chief Complaint  Patient presents with   Shortness of Breath   Brief hospital course: Beth Blankenship is a 73 y.o. female with medical history significant of HTN, CVA, and breast cancer p/w SOB, and elevated BNP c/w HFpEF exacerbation.   Pt was in her USOH until 1600 yesterday she experienced SOB while watching television. She turned up her home O2 from 4L [place] to 5L [place] and had mild to no improvement. She continued throughout the day with SOB that worsened with mild activity like getting up to go to the restroom. She became increasingly concerned when she went to bed at 2300 and was unable to lay flat; in fact, she felt like she was "drowning" and activated EMS. Of note, the patient reports eating out multiple times a week and does not adhere to a low salt diet.   In the ED, pt bradycardic and hypertensive. Labs notable for Cr 1.36 (baseline 0.8) and BNP 916. CXR with b/l pulmonary edema. Pt admitted to medicine for diuresis.   Assessment and Plan: # HFpEF exacerbation # Elevated BNP # SOB/DOE -RD consulted for low sodium diet education; apprec eval/recs -s/p IV lasix  40mg  BID; goal net neg 1-2L/d; strict I/Os; daily standing weights; K>4/Mg>2 -PTA metoprolol  100mg  BID and ASA 81mg  daily -Consider increasing pta diuretics on discharge TTE LVEF 60-65%, moderately decreased RV systolic function, severe PAH 6/1 Lasix  40 mg a.m. dose given, decreased to 20 mg from tomorrow due to elevated creatinine.  Monitor and titrate dose accordingly   # Hypokalemia due to diuresis, calcium  repleted. Monitor electrolytes.  # AKI on CKD stage II Baseline sCr 0.70 6/1 sCr 1.3 elevated due to diuresis Monitor renal functions and urine output Daily    # Severe pulmonary hypertension  RVSP 86.59mmHg; presumably related to volume overload  from above -Diuresis per above - Recommended to follow with pulmonologist as an outpatient for PFTs to exclude COPD vs sleep study to rule out OSA    H/o HTN -PTA amlodipine    H/o DM2 -SSI TID AC prn    H/o CVA -PTA aspirin  and plavix    H/o Subarachnoid hemorrhage and subdural hematoma s/p fall on 5/1 5/1 CT Head: 1. Small volume of right frontal convexity subarachnoid hemorrhage is mildly increased.  2. No substantial change in size of a right cerebral convexity subdural hematoma which measures up to approximately 5 mm in thickness. No midline shift.  5/31 CT Head: 1. Interval decrease in size of right frontoparietal convexity subdural hemorrhage, now measuring up to 3 mm. 2. Interval resolution of the previously seen subarachnoid hemorrhage along the right frontal lobe. 3. Stable background of chronic small vessel disease with old infarcts in the superior right cerebellar hemisphere and medial left occipital lobe.   H/o unspecified schizophrenia -PTA Invega  3mg  daily, buspar  and seroquel   # C/o tremors in the left hand and does not feel herself, weird feelings and lightheadedness. 6/1 Started thiamine 100 mg p.o. daily for 30 days Follow EEG to rule out seizures  # Vitamin D deficiency: started vitamin D 50,000 units p.o. weekly, follow with PCP to repeat vitamin D level after 3 to 6 months.  # Vitamin B12 level 316, goal >400: Started vitamin B12 1000 mcg IM injection daily x 3  days, followed by oral supplement.  Follow-up PCP to repeat  vitamin B12 level after 3 to 6 months.   Body mass index is 31.4 kg/m.  Interventions:  Diet: Diabetic diet DVT Prophylaxis: Subcutaneous Lovenox    Advance goals of care discussion: Full code  Family Communication: family was present at bedside, at the time of interview.  The pt provided permission to discuss medical plan with the family. Opportunity was given to ask question and all questions were answered satisfactorily.    Disposition:  Pt is from Home, admitted with CHF exacerbation, still has shortness of breath on IV Lasix , which precludes a safe discharge. Discharge to home, when stable, may need 1-2 more days to improve.  Subjective: No significant events overnight, patient's shortness of breath is getting better but she is having tremors in the left hand and also has weird feelings, she does not feel herself.  And that is going on for more than a month.  It happened before her fall last month. We will check EEG and orthostatics. Continue above treatment for now   Physical Exam: General: NAD, lying comfortably Appear in no distress, affect appropriate Eyes: PERRLA ENT: Oral Mucosa Clear, moist  Neck: no JVD,  Cardiovascular: S1 and S2 Present, no Murmur,  Respiratory: Equal air entry bilaterally, mild bibasilar crackles, no wheezes.   Abdomen: Bowel Sound present, Soft and no tenderness,  Skin: no rashes Extremities: Mild 1+ pedal edema, no calf tenderness Neurologic: without any new focal findings Gait not checked due to patient safety concerns  Vitals:   05/25/24 0700 05/25/24 1122 05/25/24 1137 05/25/24 1140  BP: (!) 158/77 (!) 147/74 (!) 141/74 137/68  Pulse: (!) 58 61 (!) 58 (!) 59  Resp: 20 18 20 16   Temp: 98.2 F (36.8 C)   98.6 F (37 C)  TempSrc: Oral   Oral  SpO2: 95% 100% 100% 98%  Weight:      Height:        Intake/Output Summary (Last 24 hours) at 05/25/2024 1400 Last data filed at 05/25/2024 1343 Gross per 24 hour  Intake 517 ml  Output 400 ml  Net 117 ml   Filed Weights   05/23/24 1534 05/24/24 0435 05/25/24 0400  Weight: 82.4 kg 81.5 kg 80.4 kg    Data Reviewed: I have personally reviewed and interpreted daily labs, tele strips, imagings as discussed above. I reviewed all nursing notes, pharmacy notes, vitals, pertinent old records I have discussed plan of care as described above with RN and patient/family.  CBC: Recent Labs  Lab 05/23/24 0130 05/23/24 0430  05/24/24 0920 05/25/24 0319  WBC 7.9 8.0 7.4 6.9  HGB 11.5* 11.2* 11.7* 10.7*  HCT 37.5 36.4 37.0 34.3*  MCV 89.1 89.7 88.9 87.9  PLT 244 231 229 205   Basic Metabolic Panel: Recent Labs  Lab 05/23/24 0130 05/23/24 0430 05/24/24 0920 05/25/24 0319 05/25/24 1010  NA 139 139 142 139  --   K 3.5 3.9 3.8 3.1*  --   CL 107 109 103 103  --   CO2 21* 21* 27 28  --   GLUCOSE 273* 262* 259* 166*  --   BUN 21 21 15 20   --   CREATININE 1.26* 1.36* 1.12* 1.30*  --   CALCIUM  8.4* 8.3* 8.8* 8.7*  --   MG  --  1.8 1.8  --  1.8  PHOS  --  4.0 3.5  --  4.7*    Studies: CT HEAD WO CONTRAST ( ) Result Date: 05/24/2024 EXAM: CT HEAD WITHOUT 05/24/2024 02:29:23 PM TECHNIQUE: CT of  the head was performed without the administration of intravenous contrast. Automated exposure control, iterative reconstruction, and/or weight based adjustment of the mA/kV was utilized to reduce the radiation dose to as low as reasonably achievable. COMPARISON: 05/05/2024 CLINICAL HISTORY: Subdural hematoma. FINDINGS: BRAIN AND VENTRICLES: Interval decrease in size of right frontoparietal convexity subdural hemorrhage, now measuring up to 3 mm on coronal image 30 series 5. Interval resolution of the previously seen subarachnoid hemorrhage along the right frontal lobe. Stable background of chronic small vessel disease with old infarcts in the superior right cerebellar hemisphere and medial left occipital lobe. Gray-white differentiation is otherwise preserved. No mass effect or midline shift. No hydrocephalus. ORBITS: No acute abnormality. SINUSES AND MASTOIDS: No acute abnormality. SOFT TISSUES AND SKULL: Decreased size of the right frontal scalp hematoma. No acute skull fracture. IMPRESSION: 1. Interval decrease in size of right frontoparietal convexity subdural hemorrhage, now measuring up to 3 mm. 2. Interval resolution of the previously seen subarachnoid hemorrhage along the right frontal lobe. 3. Stable background of  chronic small vessel disease with old infarcts in the superior right cerebellar hemisphere and medial left occipital lobe. Electronically signed by: Audra Blend MD 05/24/2024 03:07 PM EDT RP Workstation: [id number]    Scheduled Meds:  amLODipine   10 mg Oral Daily   aspirin  EC  81 mg Oral Daily   busPIRone   10 mg Oral BID   clopidogrel   75 mg Oral Daily   cyanocobalamin  1,000 mcg Intramuscular Q1200   Followed by   Cecily Cohen ON 05/28/2024] vitamin B-12  1,000 mcg Oral Daily   doxazosin   4 mg Oral BID   enoxaparin  (LOVENOX ) injection  40 mg Subcutaneous Daily   [START ON 05/26/2024] furosemide   20 mg Intravenous Daily   insulin  aspart  0-5 Units Subcutaneous QHS   insulin  aspart  0-9 Units Subcutaneous TID WC   isosorbide mononitrate  60 mg Oral QPM   metoprolol  tartrate  100 mg Oral BID   paliperidone   3 mg Oral Daily   QUEtiapine   100 mg Oral QHS   thiamine  100 mg Oral Daily   Vitamin D (Ergocalciferol)  50,000 Units Oral Q7 days   Continuous Infusions: PRN Meds: acetaminophen , hydrALAZINE  **OR** hydrALAZINE , hydrOXYzine , ipratropium-albuterol , melatonin, ondansetron , polyethylene glycol, prochlorperazine   Time spent: 55 minutes  Author: Althia Atlas. MD [facility] 05/25/2024 2:00 PM  To reach On-call, see care teams to locate the attending and reach out to them via [email or web address]. If 7PM-7AM, please contact night-coverage If you still have difficulty reaching the attending provider, please page the [facility] (Director on Call) for [facility] on amion for assistance.

## 2024-05-25 NOTE — Progress Notes (Signed)
   05/25/24 2207  BiPAP/CPAP/SIPAP  $ Non-Invasive Ventilator  Non-Invasive Vent Subsequent  BiPAP/CPAP/SIPAP Pt Type Adult  BiPAP/CPAP/SIPAP SERVO  Mask Type Full face mask  Dentures removed? Not applicable  Mask Size Medium  Set Rate 12 breaths/min  Respiratory Rate 18 breaths/min  Pressure Support 10 cmH20  PEEP 5 cmH20  FiO2 (%) 45 %  Minute Ventilation 13.3  Leak 35  Peak Inspiratory Pressure (PIP) 15  Tidal Volume (Vt) 634  Patient Home Machine No  Patient Home Mask No  Patient Home Tubing No  Auto Titrate No  Press High Alarm 30 cmH2O  Press Low Alarm 5 cmH2O  Device Plugged into RED Power Outlet Yes  BiPAP/CPAP /SiPAP Vitals  Resp (!) 22  MEWS Score/Color  MEWS Score 1  MEWS Score Color Green

## 2024-05-25 NOTE — Procedures (Signed)
 Patient Name: Beth Blankenship  MRN: [id number]  Epilepsy Attending: Arleene Lack  Referring Physician/Provider: Althia Atlas, MD  Date: 6./12/2023 Duration: 22.23 mins  Patient history: 73yo F presented with SDH. EEG to evaluate for seizure  Level of alertness: Awake  AEDs during EEG study: None  Technical aspects: This EEG study was done with scalp electrodes positioned according to the 10-20 International system of electrode placement. Electrical activity was reviewed with band pass filter of 1-70Hz , sensitivity of 7 uV/mm, display speed of 103mm/sec with a 60Hz  notched filter applied as appropriate. EEG data were recorded continuously and digitally stored.  Video monitoring was available and reviewed as appropriate.  Description: The posterior dominant rhythm consists of 7 Hz activity of moderate voltage (25-35 uV) seen predominantly in posterior head regions, symmetric and reactive to eye opening and eye closing. Hyperventilation and photic stimulation were not performed.     ABNORMALITY - Background slow  IMPRESSION: This study is suggestive of mild diffuse encephalopathy. No seizures or epileptiform discharges were seen throughout the recording.   Beth Blankenship

## 2024-05-26 DIAGNOSIS — I5033 Acute on chronic diastolic (congestive) heart failure: Secondary | ICD-10-CM | POA: Diagnosis not present

## 2024-05-26 LAB — BASIC METABOLIC PANEL WITH GFR
Anion gap: 7 (ref 5–15)
BUN: 24 mg/dL — ABNORMAL HIGH (ref 8–23)
CO2: 27 mmol/L (ref 22–32)
Calcium: 8.4 mg/dL — ABNORMAL LOW (ref 8.9–10.3)
Chloride: 102 mmol/L (ref 98–111)
Creatinine, Ser: 1.44 mg/dL — ABNORMAL HIGH (ref 0.44–1.00)
GFR, Estimated: 39 mL/min — ABNORMAL LOW (ref >60)
Glucose, Bld: 186 mg/dL — ABNORMAL HIGH (ref 70–99)
Potassium: 3.8 mmol/L (ref 3.5–5.1)
Sodium: 136 mmol/L (ref 135–145)

## 2024-05-26 LAB — CBC
HCT: 33.1 % — ABNORMAL LOW (ref 36.0–46.0)
Hemoglobin: 10.4 g/dL — ABNORMAL LOW (ref 12.0–15.0)
MCH: 27.8 pg (ref 26.0–34.0)
MCHC: 31.4 g/dL (ref 30.0–36.0)
MCV: 88.5 fL (ref 80.0–100.0)
Platelets: 208 10*3/uL (ref 150–400)
RBC: 3.74 MIL/uL — ABNORMAL LOW (ref 3.87–5.11)
RDW: 14.5 % (ref 11.5–15.5)
WBC: 8 10*3/uL (ref 4.0–10.5)
nRBC: 0 % (ref 0.0–0.2)

## 2024-05-26 LAB — GLUCOSE, CAPILLARY
Glucose-Capillary: 194 mg/dL — ABNORMAL HIGH (ref 70–99)
Glucose-Capillary: 255 mg/dL — ABNORMAL HIGH (ref 70–99)
Glucose-Capillary: 275 mg/dL — ABNORMAL HIGH (ref 70–99)
Glucose-Capillary: 181 mg/dL — ABNORMAL HIGH (ref 70–99)
Glucose-Capillary: 228 mg/dL — ABNORMAL HIGH (ref 70–99)

## 2024-05-26 LAB — MAGNESIUM: Magnesium: 1.8 mg/dL (ref 1.7–2.4)

## 2024-05-26 LAB — PHOSPHORUS: Phosphorus: 3.7 mg/dL (ref 2.5–4.6)

## 2024-05-26 MED ORDER — FUROSEMIDE 10 MG/ML IJ SOLN: 20.0000 mg | Freq: Every day | INTRAMUSCULAR | Status: DC

## 2024-05-26 MED ORDER — PALIPERIDONE ER 1.5 MG PO TB24
1.5000 mg | ORAL_TABLET | Freq: Every day | ORAL | Status: DC
  Administered 2024-05-27: 1.5 mg via ORAL
  Filled 2024-05-26 (×2): qty 1

## 2024-05-26 NOTE — TOC Progression Note (Addendum)
 Transition of Care [facility]) - Progression Note    Patient Details  Name: Beth Blankenship MRN: [id number] Date of Birth: 07-10-1951  Transition of [facility]) CM/SW Contact  Jannine Meo, RN Phone Number: 05/26/2024, 3:49 PM  Clinical Narrative:   Per Charl Concha, pt active with [facility] 2021-2022. Message referral to [facility] with [facility], awaiting response.  1638: Per CSW, patient agreeable to SNF as recommended by PT. Bartholomew Light with [facility] made aware.         Expected Discharge Plan and Services                                               Social Determinants of Health (SDOH) Interventions SDOH Screenings   Food Insecurity: No Food Insecurity (05/23/2024)  Housing: Unknown (05/23/2024)  Recent Concern: Housing - High Risk (05/16/2024)   Received from [facility]  Transportation Needs: No Transportation Needs (05/23/2024)  Utilities: Not At Risk (05/23/2024)  Depression (PHQ2-9): High Risk (10/31/2022)  Financial Resource Strain: Medium Risk (05/16/2024)   Received from [facility]  Physical Activity: Unknown (08/09/2022)   Received from [facility], [facility]  Recent Concern: Physical Activity - Inactive (08/09/2022)   Received from [facility]  Social Connections: Unknown (05/23/2024)  Stress: No Stress Concern Present (08/19/2023)   Received from [facility]  Tobacco Use: Medium Risk (05/16/2024)   Received from [facility]    Readmission Risk Interventions     No data to display

## 2024-05-26 NOTE — Plan of Care (Signed)
   Problem: Fluid Volume: Goal: Ability to maintain a balanced intake and output will improve Outcome: Progressing

## 2024-05-26 NOTE — TOC Initial Note (Signed)
 Transition of Care [facility]) - Initial/Assessment Note    Patient Details  Name: Beth Blankenship MRN: [id number] Date of Birth: 10-11-1951  Transition of Care [facility]) CM/SW Contact:    Arron Big, LCSWA Phone Number: 05/26/2024, 4:26 PM  Clinical Narrative:     CSW met patient at bedside and introduced self/role. CSW discussed PT recs for SNF. Patient stated she wants to go to rehab but that her family will more than likely not want her to. Patient is alert and oriented x4 at this time.  Per psych, capacity has not been addressed with patient at this time. CSW to complete SNF workup at this time.   TOC will continue to follow.             Expected Discharge Plan: Skilled Nursing Facility Barriers to Discharge: Continued Medical Work up, SNF Pending bed offer, Insurance Authorization   Patient Goals and CMS Choice Patient states their goals for this hospitalization and ongoing recovery are:: To go to SNF rehab          Expected Discharge Plan and Services In-house Referral: Clinical Social Work     Living arrangements for the past 2 months: Single Family Home                                      Prior Living Arrangements/Services Living arrangements for the past 2 months: Single Family Home Lives with:: Spouse Patient language and need for interpreter reviewed:: No Do you feel safe going back to the place where you live?: Yes      Need for Family Participation in Patient Care: Yes (Comment) Care giver support system in place?: Yes (comment)   Criminal Activity/Legal Involvement Pertinent to Current Situation/Hospitalization: No - Comment as needed  Activities of Daily Living      Permission Sought/Granted Permission sought to share information with : Family Supports, Oceanographer granted to share information with : Yes, Verbal Permission Granted  Share Information with NAME: Denman Fischer  Permission granted to share info w AGENCY:  SNFs  Permission granted to share info w Relationship: Spouse  Permission granted to share info w Contact Information: [phone]  Emotional Assessment Appearance:: Appears stated age Attitude/Demeanor/Rapport: Engaged Affect (typically observed): Pleasant Orientation: : Oriented to Self, Oriented to Place, Oriented to  Time, Oriented to Situation Alcohol / Substance Use: Not Applicable Psych Involvement: Yes (comment) (There are no psychiatric contraindications to discharge at this time)  Admission diagnosis:  Acute exacerbation of CHF (congestive heart failure) (HCC) [I50.9] Acute respiratory failure with hypoxia (HCC) [J96.01] Acute congestive heart failure, unspecified heart failure type (HCC) [I50.9] Patient Active Problem List   Diagnosis Date Noted   Acute exacerbation of CHF (congestive heart failure) (HCC) 05/23/2024   Mild aortic stenosis 01/12/2022   Debility 01/21/2021   Hypoglycemia associated with diabetes (HCC) 01/21/2021   AKI (acute kidney injury) (HCC) 01/20/2021   CKD stage G4/A3, 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) 01/20/2021   Hyperkalemia 01/20/2021   AMS (altered mental status) 12/03/2020   Delirium due to another medical condition 12/03/2020   Psychosis (HCC) 12/03/2020   Acute diastolic CHF (congestive heart failure) (HCC) 10/26/2020   SOB (shortness of breath) 10/25/2020   Severe obesity (BMI 35.0-39.9) with comorbidity (HCC) 01/23/2019   Malignant neoplasm of upper-outer quadrant of left breast in female, estrogen receptor negative (HCC) 04/09/2018   Transient ischemic attack  02/13/2018   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   Mixed hyperlipidemia 02/06/2018   Asthma, intermittent, uncomplicated 07/09/2017   Cellulitis of neck 06/24/2017   Sialadenitis 06/24/2017   Lactic acidosis 06/24/2017   Salivary duct stone 06/24/2017   Essential hypertension    History of TIA (transient ischemic attack)    Diabetes mellitus without complication  (HCC)    Arthritis    History of breast cancer    H/O mastectomy    History of breast reconstruction    Heart murmur 01/13/2014   Obesity (BMI 30.0-34.9) 01/13/2014   PCP:  Patient, No Pcp Per Pharmacy:   [id number] [place], [place] - [address] AT [facility] [address] & [facility] [address] [place] [address] Phone: [phone] Fax: [phone]  [facility] [address], [place] - [address] [place] [address] Phone: [phone] Fax: [phone]     Social Drivers of Health (SDOH) Social History: SDOH Screenings   Food Insecurity: No Food Insecurity (05/23/2024)  Housing: Unknown (05/23/2024)  Recent Concern: Housing - High Risk (05/16/2024)   Received from [facility]  Transportation Needs: No Transportation Needs (05/23/2024)  Utilities: Not At Risk (05/23/2024)  Depression (PHQ2-9): High Risk (10/31/2022)  Financial Resource Strain: Medium Risk (05/16/2024)   Received from [facility]  Physical Activity: Unknown (08/09/2022)   Received from [facility], [facility]  Recent Concern: Physical Activity - Inactive (08/09/2022)   Received from [facility]  Social Connections: Unknown (05/23/2024)  Stress: No Stress Concern Present (08/19/2023)   Received from [facility]  Tobacco Use: Medium Risk (05/16/2024)   Received from [facility]   SDOH Interventions:     Readmission Risk Interventions     No data to display

## 2024-05-26 NOTE — Progress Notes (Signed)
 Heart Failure Navigator Progress Note  Assessed for Heart & Vascular TOC clinic readiness.  Patient does not meet criteria due to is established with [facility] . No HF TOC. .   Navigator will sign off at this time.   Randie Bustle, BSN, Scientist, clinical (histocompatibility and immunogenetics) Only

## 2024-05-26 NOTE — Inpatient Diabetes Management (Signed)
 Inpatient Diabetes Program Recommendations  AACE/ADA: New Consensus Statement on Inpatient Glycemic Control (2015)  Target Ranges:  Prepandial:   less than 140 mg/dL      Peak postprandial:   less than 180 mg/dL (1-2 hours)      Critically ill patients:  140 - 180 mg/dL   Lab Results  Component Value Date   GLUCAP 255 (H) 05/26/2024   HGBA1C 8.6 (H) 05/23/2024    Review of Glycemic Control  Diabetes history: DM2 Outpatient Diabetes medications: Jardiance 10 daily Current orders for Inpatient glycemic control: Novolog  0-9 TID with meals and 0-5 HS  HgbA1C - 8.6% CBGs 194, 255  Inpatient Diabetes Program Recommendations:    Consider adding Novolog  3 units TID with meals if eating > 50%  Add CHO mod diet to 2 gm Na+  If FBS > 180 mg/dL, add Semglee  8 units daily  Continue to follow.   Thank you. Joni Net, RD, LDN, CDCES Inpatient Diabetes Coordinator [phone]

## 2024-05-26 NOTE — Progress Notes (Signed)
 [facility] Progress Note  Patient: Beth Blankenship    BMW:[id number]  DOA: 05/23/2024     Date of Service: the patient was seen and examined on 05/26/2024  Chief Complaint  Patient presents with   Shortness of Breath   Brief hospital course: Beth Blankenship is a 73 y.o. female with medical history significant of HTN, CVA, and breast cancer p/w SOB, and elevated BNP c/w HFpEF exacerbation.   Pt was in her USOH until 1600 yesterday she experienced SOB while watching television. She turned up her home O2 from 4L [place] to 5L [place] and had mild to no improvement. She continued throughout the day with SOB that worsened with mild activity like getting up to go to the restroom. She became increasingly concerned when she went to bed at 2300 and was unable to lay flat; in fact, she felt like she was "drowning" and activated EMS. Of note, the patient reports eating out multiple times a week and does not adhere to a low salt diet.   In the ED, pt bradycardic and hypertensive. Labs notable for Cr 1.36 (baseline 0.8) and BNP 916. CXR with b/l pulmonary edema. Pt admitted to medicine for diuresis.   Assessment and Plan: # HFpEF exacerbation # Elevated BNP # SOB/DOE -RD consulted for low sodium diet education; apprec eval/recs -s/p IV lasix  40mg  BID; goal net neg 1-2L/d; strict I/Os; daily standing weights; K>4/Mg>2 -PTA metoprolol  100mg  BID and ASA 81mg  daily -Consider increasing pta diuretics on discharge TTE LVEF 60-65%, moderately decreased RV systolic function, severe PAH 6/1 Lasix  40 mg a.m. dose given, held Lasix  for now due to elevated creatinine 6/2 creatinine 1.44 still elevated, hold Lasix  for now Monitor and titrate dose accordingly   # Hypokalemia due to diuresis, calcium  repleted. Monitor electrolytes.  # AKI on CKD stage II Baseline sCr 0.70 6/2 sCr 1.33 elevated due to diuresis Monitor renal functions and urine output Daily    # Severe pulmonary hypertension  RVSP  86.58mmHg; presumably related to volume overload from above -Diuresis per above - Recommended to follow with pulmonologist as an outpatient for PFTs to exclude COPD vs sleep study to rule out OSA    H/o HTN -PTA amlodipine    H/o DM2 -SSI TID AC prn    H/o CVA -PTA aspirin  and plavix    H/o Subarachnoid hemorrhage and subdural hematoma s/p fall on 5/1 5/1 CT Head: 1. Small volume of right frontal convexity subarachnoid hemorrhage is mildly increased.  2. No substantial change in size of a right cerebral convexity subdural hematoma which measures up to approximately 5 mm in thickness. No midline shift.  5/31 CT Head: 1. Interval decrease in size of right frontoparietal convexity subdural hemorrhage, now measuring up to 3 mm. 2. Interval resolution of the previously seen subarachnoid hemorrhage along the right frontal lobe. 3. Stable background of chronic small vessel disease with old infarcts in the superior right cerebellar hemisphere and medial left occipital lobe.   H/o unspecified schizophrenia -PTA Invega  3mg  daily, buspar  and seroquel  # C/o tremors in the left hand and does not feel herself, weird feelings and lightheadedness. 6/1 Started thiamine 100 mg p.o. daily for 30 days EEG: Negative for seizures, generalized encephalopathy.  6/2 psych consulted for possible parkinsonian symptoms.  Psych decreased dose of Invega  from 3 to 1.5 mg p.o. daily.  Recommended neurology follow-up as an outpatient for Lewy body dementia workup.  Patient may need MRI brain  # Vitamin D deficiency: started vitamin D 50,000 units  p.o. weekly, follow with PCP to repeat vitamin D level after 3 to 6 months.  # Vitamin B12 level 316, goal >400: Started vitamin B12 1000 mcg IM injection daily x 3  days, followed by oral supplement.  Follow-up PCP to repeat vitamin B12 level after 3 to 6 months.   Body mass index is 31.87 kg/m.  Interventions:  Diet: Diabetic diet DVT Prophylaxis: Subcutaneous  Lovenox    Advance goals of care discussion: Full code  Family Communication: family was not present at bedside, at the time of interview.  The pt provided permission to discuss medical plan with the family. Opportunity was given to ask question and all questions were answered satisfactorily.   Disposition:  Pt is from Home, admitted with CHF exacerbation, still has shortness of breath on IV Lasix , which precludes a safe discharge. Discharge to home, when stable, may need 1-2 more days to improve.  Subjective: No significant events overnight, patient was sitting comfortably in the recliner, stated that she is still having tremors in the left hand.  Denied any dizziness or spinning of head but she feels little bit lightheaded, compared to yesterday she feels improvement. Denied any worsening of shortness of breath, no chest pain or palpitations. Patient agreed for psych consult   Physical Exam: General: NAD, lying comfortably Appear in no distress, affect appropriate Eyes: PERRLA ENT: Oral Mucosa Clear, moist  Neck: no JVD,  Cardiovascular: S1 and S2 Present, no Murmur,  Respiratory: Equal air entry bilaterally, mild bibasilar crackles, no wheezes.   Abdomen: Bowel Sound present, Soft and no tenderness,  Skin: no rashes Extremities: Mild 1+ pedal edema, no calf tenderness Neurologic: without any new focal findings Gait not checked due to patient safety concerns  Vitals:   05/26/24 0000 05/26/24 0530 05/26/24 0700 05/26/24 1120  BP: 130/65 (!) 159/68 (!) 165/90 (!) 151/69  Pulse: 60 67 65 67  Resp: (!) 22 16 (!) 23 19  Temp: 98.6 F (37 C) 98.6 F (37 C)  97.8 F (36.6 C)  TempSrc:  Oral  Oral  SpO2: 100% 94% 94% 96%  Weight:  81.6 kg    Height:        Intake/Output Summary (Last 24 hours) at 05/26/2024 1432 Last data filed at 05/26/2024 1326 Gross per 24 hour  Intake 240 ml  Output 300 ml  Net -60 ml   Filed Weights   05/24/24 0435 05/25/24 0400 05/26/24 0530  Weight:  81.5 kg 80.4 kg 81.6 kg    Data Reviewed: I have personally reviewed and interpreted daily labs, tele strips, imagings as discussed above. I reviewed all nursing notes, pharmacy notes, vitals, pertinent old records I have discussed plan of care as described above with RN and patient/family.  CBC: Recent Labs  Lab 05/23/24 0130 05/23/24 0430 05/24/24 0920 05/25/24 0319 05/26/24 0302  WBC 7.9 8.0 7.4 6.9 8.0  HGB 11.5* 11.2* 11.7* 10.7* 10.4*  HCT 37.5 36.4 37.0 34.3* 33.1*  MCV 89.1 89.7 88.9 87.9 88.5  PLT 244 231 229 205 208   Basic Metabolic Panel: Recent Labs  Lab 05/23/24 0130 05/23/24 0430 05/24/24 0920 05/25/24 0319 05/25/24 1010 05/26/24 0302  NA 139 139 142 139  --  136  K 3.5 3.9 3.8 3.1*  --  3.8  CL 107 109 103 103  --  102  CO2 21* 21* 27 28  --  27  GLUCOSE 273* 262* 259* 166*  --  186*  BUN 21 21 15 20   --  24*  CREATININE 1.26* 1.36* 1.12* 1.30*  --  1.44*  CALCIUM  8.4* 8.3* 8.8* 8.7*  --  8.4*  MG  --  1.8 1.8  --  1.8 1.8  PHOS  --  4.0 3.5  --  4.7* 3.7    Studies: EEG adult Result Date: 05/25/2024 Beth Lack, MD     05/25/2024  5:04 PM Patient Name: Beth Blankenship MRN: [id number] Epilepsy Attending: Arleene Blankenship Referring Physician/Provider: Althia Atlas, MD Date: 6./12/2023 Duration: 22.23 mins Patient history: 73yo F presented with SDH. EEG to evaluate for seizure Level of alertness: Awake AEDs during EEG study: None Technical aspects: This EEG study was done with scalp electrodes positioned according to the 10-20 International system of electrode placement. Electrical activity was reviewed with band pass filter of 1-70Hz , sensitivity of 7 uV/mm, display speed of 75mm/sec with a 60Hz  notched filter applied as appropriate. EEG data were recorded continuously and digitally stored.  Video monitoring was available and reviewed as appropriate. Description: The posterior dominant rhythm consists of 7 Hz activity of moderate voltage (25-35 uV)  seen predominantly in posterior head regions, symmetric and reactive to eye opening and eye closing. Hyperventilation and photic stimulation were not performed.   ABNORMALITY - Background slow IMPRESSION: This study is suggestive of mild diffuse encephalopathy. No seizures or epileptiform discharges were seen throughout the recording. Beth Blankenship    Scheduled Meds:  amLODipine   10 mg Oral Daily   aspirin  EC  81 mg Oral Daily   busPIRone   10 mg Oral BID   clopidogrel   75 mg Oral Daily   cyanocobalamin  1,000 mcg Intramuscular Q1200   Followed by   Beth Blankenship ON 05/28/2024] vitamin B-12  1,000 mcg Oral Daily   doxazosin   4 mg Oral BID   enoxaparin  (LOVENOX ) injection  40 mg Subcutaneous Daily   [START ON 05/27/2024] furosemide   20 mg Intravenous Daily   insulin  aspart  0-5 Units Subcutaneous QHS   insulin  aspart  0-9 Units Subcutaneous TID WC   isosorbide mononitrate  60 mg Oral QPM   metoprolol  tartrate  100 mg Oral BID   [START ON 05/27/2024] paliperidone   1.5 mg Oral Daily   QUEtiapine   100 mg Oral QHS   thiamine  100 mg Oral Daily   Vitamin D (Ergocalciferol)  50,000 Units Oral Q7 days   Continuous Infusions: PRN Meds: acetaminophen , hydrALAZINE  **OR** hydrALAZINE , hydrOXYzine , ipratropium-albuterol , melatonin, ondansetron , polyethylene glycol, prochlorperazine   Time spent: 55 minutes  Author: Althia Blankenship. MD [facility] 05/26/2024 2:32 PM  To reach On-call, see care teams to locate the attending and reach out to them via [email or web address]. If 7PM-7AM, please contact night-coverage If you still have difficulty reaching the attending provider, please page the [facility] (Director on Call) for [facility] on amion for assistance.

## 2024-05-26 NOTE — Plan of Care (Signed)
  Problem: Coping: Goal: Ability to adjust to condition or change in health will improve Outcome: Progressing   Problem: Fluid Volume: Goal: Ability to maintain a balanced intake and output will improve Outcome: Progressing   Problem: Health Behavior/Discharge Planning: Goal: Ability to identify and utilize available resources and services will improve Outcome: Progressing Goal: Ability to manage health-related needs will improve Outcome: Progressing   Problem: Nutritional: Goal: Progress toward achieving an optimal weight will improve Outcome: Progressing   Problem: Tissue Perfusion: Goal: Adequacy of tissue perfusion will improve Outcome: Progressing

## 2024-05-26 NOTE — Consult Note (Signed)
 [facility] Psychiatric Consult Initial  Patient Name: .Beth Blankenship  MRN: [id number]  DOB: Aug 19, 1951  Consult Order details:  Orders (From admission, onward)     Start     Ordered   05/26/24 1115  IP CONSULT TO PSYCHIATRY       Ordering Provider: Althia Atlas, MD  Provider:  (Not yet assigned)  Question Answer Comment  Location [facility]   Reason for Consult? please eval for Extrapyramidal symptoms, c/o left hand tremors and difficulty walking.      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 female admitted: Presented to the EDfor 05/23/2024  1:20 AM for HFpEF exacerbation. 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 She does not meet criteria for involuntary commitment based on no threat to self or others.  Current outpatient psychotropic medications include Invega  3 mg daily, Seroquel  100 mg nightly, BuSpar  10 mg twice daily and historically she has had a good response to these medications. She endorsed compliance with medications, but that husband was in charge of these.  Please see plan below for detailed recommendations.   Diagnoses:  Active Hospital problems: Principal Problem:   Acute exacerbation of CHF (congestive heart failure) (HCC)    Plan   ## Psychiatric Medication Recommendations:  - Decrease Invega  3 mg ? 1.5 mg daily to target EPS symptomatology (cogwheeling  rigidity) in setting of suspected Dementia with Lewy Body - Continue Seroquel  100 mg nightly - Continue BuSpar  10 mg twice daily  ## Medical Decision Making Capacity: Not specifically addressed in this encounter  ## Further Work-up:  - Inpatient versus outpatient neurology follow-up for suspected Dementia with Lewy Body. - Recommend MRI for further organic workup  - Agree with vitamin B12 repletion - Defer T2DM management at A1c: 8.6% to primary team While pt on Qtc prolonging medications, please monitor & replete K+ to 4 and Mg2+ to 2 -- most recent EKG on 5/30 had QtC of 452 -- Pertinent labwork reviewed earlier this admission includes: B12 316   ## Disposition:-- There are no psychiatric contraindications to discharge at this time  ## Behavioral / Environmental: - No specific recommendations at this time.     ## Safety and Observation Level:  - Based on my clinical evaluation, I estimate the patient to be at minimal risk of self harm in the current setting. - At this time, we recommend  routine. This decision is based on my review of the chart including patient's history and current presentation, interview of the patient, mental status examination, and consideration of suicide risk including evaluating suicidal ideation, plan, intent, suicidal or self-harm behaviors, risk factors, and protective factors. This judgment is based on our ability to directly address suicide risk, implement suicide prevention strategies, and develop a safety plan while the patient is in the clinical setting. Please contact our team if there is a concern that risk level has changed.  CSSR Risk Category:C-SSRS RISK CATEGORY: No Risk  Suicide Risk Assessment: Patient has following modifiable risk factors for suicide: None Patient has following non-modifiable or demographic risk factors for suicide: psychiatric hospitalization Patient has the following protective factors against suicide: Access to outpatient  mental health care, Supportive family, Frustration tolerance, no history of suicide attempts, and no history of NSSIB  Thank you for this consult request. Recommendations have been communicated to the primary team.  We will continue to follow at this time.   Jatavis Malek, MD       History of Present Illness  Relevant Aspects of Hospital Course:  Admitted on 05/23/2024 for heart failure with preserved ejection fraction exacerbation patient was bradycardic and hypertensive in the ED.  Also noted AKI.  Started IV Lasix  40 mg twice daily.  Patient respiratory status improved.  Psychiatry was consulted for evaluation of extrapyramidal symptoms in the setting of dual antipsychotic therapy (Invega  3 mg daily, quetiapine  700 mg nightly).  Patient Report:  Patient reports intermittent severe left upper extremity tremor that occurs at home and that causes her to drop objects.  Also notices some shakiness when trying to stand, which she attributes to muscle weakness.  No tremor noted at rest during interview.  Questionable tongue fasciculation, upper end of physiologic normal, was also noted. Patient says that tremor has not acutely worsened in the hospital. Also notes that "I feel kind of spacey", however "this is not really different, I always feel kind of spacey."  Also notes bilateral upper extremity tingling and numbness in the fingertips, which worsens with pressure.  (Patient is currently being treated for B12 deficiency and has a diagnosis of type 2 diabetes with an A1c of 8.6%.)  Patient is on Invega  3 mg daily and quetiapine  100 mg nightly for visual and auditory hallucinations.  Patient carries psychiatric diagnoses of schizoaffective disorder depressive type and major depressive disorder, recurrent, severe, with psychosis.  However, patient reports that she had her first hallucinations (visual and auditory) at the age of 73, which is virtually unheard of in the above diagnoses.  Denies substance  use history.  Of note, patient has cogwheeling rigidity, flat facies and some speech latency. Her hallucinations are largely visual, and consist of "little men" that emerged from her closet at nighttime, "soldiers coming up through my window and letting out some sort of gas," and "I thought I could see a sniper that was trying to get into her house."  Last hallucination was 2 months ago, and patient says that keeping the light on in the closet helps keep these away.  Also notes "a feeling that comes over me" of dizziness.  It is unclear whether or not patient has actual auditory hallucinations of the devil, although reports that the devil "told me to hurt my husband" last year.  As also said that "the devil was telling me to kill myself."  Was able to voice these thoughts to her husband, and denies that she was ever actually a threat to herself or him him.  She says that her antipsychotic medications help her with the hallucinations, but she is not entirely clear on what medication she is taking.  Has been hospitalized "2 or 3 times" for psychosis.   Psych ROS:  Depression: Positive for the following neurovegetative symptoms over the last 2 weeks: Low mood, difficulty enjoying things she has previously enjoyed, low energy, difficulty concentrating, psychomotor slowing, and passive suicidal ideation "sometimes".  Last endorsed 2 days ago.  Denies history  of active suicidal ideation.  Denies plan. Anxiety: Patient endorses general anxiety out of proportion to stressor about many aspects of her life. Mania (lifetime and current): Patient denies history of 4+ days of elevated and expansive mood including talkativeness, irritability, disinhibition, etc. Psychosis: (lifetime and current): As above, patient endorses new onset visual and auditory hallucinations around the age of 7.  Adamant that previous to that time, patient has never had hallucinations of any sort.  Collateral information:  Contacted Denisa Enterline, spouse at [phone] on 6/2: Called x2, number nonservice.  We will request permission to reach out to daughter tomorrow for collateral information.  Review of Systems  Psychiatric/Behavioral:  Negative for hallucinations.   All other systems reviewed and are negative.    Psychiatric and Social History  Psychiatric History:  Information collected from patient  Prev Dx/Sx: Schizoaffective disorder, bipolar type; major depressive disorder, with psychotic features Current Psych Provider:  Home Meds (current): PO Invega  3 mg daily, Seroquel  100 mg nightly, BuSpar  10 mg twice daily Previous Med Trials: Zoloft  150 mg (may be current med, will need to get in touch with husband) Therapy: Previously  Prior Psych Hospitalization: 2-3 times at [facility] Prior Self Harm: Denies Prior Violence: Denies  Family Psych History: Close family member had "a mental breakdown", however denies history of bipolar disorder and schizophrenia in immediate family Family Hx suicide: Denies  Social History:  Developmental Hx: Unremarkable Educational Hx: 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 Hx: None Living Situation: Husband and daughters (2) Access to weapons/lethal means: Denies  Substance History Alcohol: Previously in college Last Drink unknown Number of drinks per day unknown History of alcohol withdrawal seizures denies History of DT's denies Tobacco: Remote Illicit drugs: Denies Prescription drug abuse: Denies Rehab hx: Denies  Exam Findings  Physical Exam:  Vital Signs:  Temp:  [97.8 F (36.6 C)-98.9 F (37.2 C)] 97.8 F (36.6 C) (06/02 1120) Pulse Rate:  [60-73] 67 (06/02 1120) Resp:  [16-23] 19 (06/02 1120) BP: (130-176)/(65-90) 151/69 (06/02 1120) SpO2:  [91 %-100 %] 96 % (06/02 1120) FiO2 (%):  [45 %] 45 % (06/01 2207) Weight:  [81.6 kg] 81.6 kg (06/02 0530) Blood pressure (!) 151/69, pulse 67, temperature 97.8 F (36.6 C), temperature source  Oral, resp. rate 19, height 5\' 3"  (1.6 m), weight 81.6 kg, SpO2 96%. Body mass index is 31.87 kg/m.  Physical Exam Constitutional:      General: She is not in acute distress.    Appearance: She is normal weight.  Pulmonary:     Effort: Pulmonary effort is normal. No tachypnea.  Neurological:     General: No focal deficit present.     Mental Status: She is alert.     Comments: Cogwheeling rigidity noted in bilateral elbows and wrists on physical exam.  No focal neurological deficit on brief exam: Cranial nerves III through VI, VII, XII intact Followed verbal commands, interactive  Noted bilateral upper extremity neuropathic pain/tingling  No tremor noted in bilateral upper and lower extremities at baseline.     Mental Status Exam: General Appearance: Casual, flat faces  Orientation:  Full (Time, Place, and Person)  Memory:  Immediate;   Fair Recent;   Good  Concentration:  Concentration: Fair and Attention Span: Fair  Recall:  Fair  Attention  Good  Eye Contact:  Good  Speech:  Slow and some speech latency  Language:  Good  Volume:  Normal  Mood: Depressed  Affect:  Flat  Thought Process:  Coherent, Goal Directed, and Linear  Thought Content:  Logical, Hallucinations: Previous, visual, auditory command (HI/SI), and Paranoid Ideation  Suicidal Thoughts:  No  Homicidal Thoughts:  No  Judgement:  Fair  Insight:  Fair  Psychomotor Activity:  EPS, cogwheeling rigidity noted in bilateral elbows and wrists  Akathisia:  No  Fund of Knowledge: Fair      Assets:  Desire for Improvement Housing Social Support  Cognition:  Impaired   ADL's:  Impaired  AIMS (if indicated): 1, 0, 0, 3, 1     Other History   These have been pulled in through the EMR, reviewed, and updated if appropriate.  Family History:  The patient's family history includes Cancer in her maternal grandmother; Diabetes in her paternal aunt and paternal uncle; Heart disease in her father and mother;  Hypertension in her mother; Stroke in her mother.  Medical History: Past Medical History:  Diagnosis Date   Arthritis    Cancer (HCC)    Diabetes mellitus without complication (HCC)    H/O mastectomy    left side   History of breast reconstruction    Hypertension    Stroke [facility])    mini strokes    Surgical History: Past Surgical History:  Procedure Laterality Date   MASTECTOMY Left 04/29/2015   REDUCTION MAMMAPLASTY Right    SHOULDER SURGERY Right    TUBAL LIGATION       Medications:   Current Facility-Administered Medications:    acetaminophen  (TYLENOL ) tablet 650 mg, 650 mg, Oral, Q6H PRN, Hall, Carole N, DO   amLODipine  (NORVASC ) tablet 10 mg, 10 mg, Oral, Daily, Arne Langdon, MD, 10 mg at 05/26/24 1028   aspirin  EC tablet 81 mg, 81 mg, Oral, Daily, Arne Langdon, MD, 81 mg at 05/26/24 1028   busPIRone  (BUSPAR ) tablet 10 mg, 10 mg, Oral, BID, Arne Langdon, MD, 10 mg at 05/26/24 1028   clopidogrel  (PLAVIX ) tablet 75 mg, 75 mg, Oral, Daily, Arne Langdon, MD, 75 mg at 05/26/24 1028   cyanocobalamin (VITAMIN B12) injection 1,000 mcg, 1,000 mcg, Intramuscular, Q1200, 1,000 mcg at 05/26/24 1029 **FOLLOWED BY** [START ON 05/28/2024] cyanocobalamin (VITAMIN B12) tablet 1,000 mcg, 1,000 mcg, Oral, Daily, Althia Atlas, MD   doxazosin  (CARDURA ) tablet 4 mg, 4 mg, Oral, BID, Arne Langdon, MD, 4 mg at 05/26/24 1028   enoxaparin  (LOVENOX ) injection 40 mg, 40 mg, Subcutaneous, Daily, Hall, Carole N, DO, 40 mg at 05/26/24 1028   [START ON 05/27/2024] furosemide  (LASIX ) injection 20 mg, 20 mg, Intravenous, Daily, Althia Atlas, MD   hydrALAZINE  (APRESOLINE ) injection 10 mg, 10 mg, Intravenous, Q6H PRN, 10 mg at 05/25/24 1644 **OR** hydrALAZINE  (APRESOLINE ) tablet 50 mg, 50 mg, Oral, Q6H PRN, Althia Atlas, MD, 50 mg at 05/24/24 1109   hydrOXYzine  (ATARAX ) tablet 10 mg, 10 mg, Oral, Daily PRN, Arne Langdon, MD, 10 mg at 05/25/24 1740   insulin  aspart (novoLOG ) injection 0-5 Units, 0-5  Units, Subcutaneous, QHS, Hall, Carole N, DO, 3 Units at 05/24/24 2147   insulin  aspart (novoLOG ) injection 0-9 Units, 0-9 Units, Subcutaneous, TID WC, Hall, Carole N, DO, 5 Units at 05/26/24 1159   ipratropium-albuterol  (DUONEB) 0.5-2.5 (3) MG/3ML nebulizer solution 3 mL, 3 mL, Nebulization, Q6H PRN, Arne Langdon, MD, 3 mL at 05/24/24 1836   isosorbide mononitrate (IMDUR) 24 hr tablet 60 mg, 60 mg, Oral, QPM, Althia Atlas, MD, 60 mg at 05/25/24 1622   melatonin tablet 5 mg, 5 mg, Oral, QHS PRN, Reesa Cannon N, DO   metoprolol  tartrate (LOPRESSOR ) tablet  100 mg, 100 mg, Oral, BID, Althia Atlas, MD, 100 mg at 05/26/24 1028   ondansetron  (ZOFRAN -ODT) disintegrating tablet 4 mg, 4 mg, Oral, Q8H PRN, Arne Langdon, MD   paliperidone  (INVEGA ) 24 hr tablet 3 mg, 3 mg, Oral, Daily, Arne Langdon, MD, 3 mg at 05/26/24 1029   polyethylene glycol (MIRALAX  / GLYCOLAX ) packet 17 g, 17 g, Oral, Daily PRN, Hall, Carole N, DO   prochlorperazine  (COMPAZINE ) injection 5 mg, 5 mg, Intravenous, Q6H PRN, Hall, Carole N, DO   QUEtiapine  (SEROQUEL ) tablet 100 mg, 100 mg, Oral, QHS, Arne Langdon, MD, 100 mg at 05/25/24 2115   thiamine (VITAMIN B1) tablet 100 mg, 100 mg, Oral, Daily, Althia Atlas, MD, 100 mg at 05/26/24 1028   Vitamin D (Ergocalciferol) (DRISDOL) 1.25 MG (50000 UNIT) capsule 50,000 Units, 50,000 Units, Oral, Q7 days, Althia Atlas, MD, 50,000 Units at 05/25/24 1138  Allergies: Allergies  Allergen Reactions   Grass Pollen(K-O-[id number]-T-Swt Vern)    Latex Hives   Tetracyclines & Related Hives    Jermall Isaacson, MD

## 2024-05-26 NOTE — Progress Notes (Signed)
   05/26/24 2240  BiPAP/CPAP/SIPAP  Reason BIPAP/CPAP not in use Other(comment) (Pt stated she doesn't want to wear BiPAP tonight. BiPAP removed and pt placed back on [place].)

## 2024-05-26 NOTE — NC FL2 (Signed)
   MEDICAID FL2 LEVEL OF CARE FORM     IDENTIFICATION  Patient Name: Beth Blankenship Birthdate: January 11, 1951 Sex: female Admission Date (Current Location): 05/23/2024  [facility] and [place] Number:  Producer, television/film/video and Address:  [place]. [facility], [address], [place], [place] [address]      Provider Number: [id number]  Attending Physician Name and Address:  Althia Atlas, MD  Relative Name and Phone Number:       Current Level of Care: Hospital Recommended Level of Care: Skilled Nursing Facility Prior Approval Number:    Date Approved/Denied:   PASRR Number: [id number]  Discharge Plan: SNF    Current Diagnoses: Patient Active Problem List   Diagnosis Date Noted   Acute exacerbation of CHF (congestive heart failure) (HCC) 05/23/2024   Mild aortic stenosis 01/12/2022   Debility 01/21/2021   Hypoglycemia associated with diabetes (HCC) 01/21/2021   AKI (acute kidney injury) (HCC) 01/20/2021   CKD stage G4/A3, 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) 01/20/2021   Hyperkalemia 01/20/2021   AMS (altered mental status) 12/03/2020   Delirium due to another medical condition 12/03/2020   Psychosis (HCC) 12/03/2020   Acute diastolic CHF (congestive heart failure) (HCC) 10/26/2020   SOB (shortness of breath) 10/25/2020   Severe obesity (BMI 35.0-39.9) with comorbidity (HCC) 01/23/2019   Malignant neoplasm of upper-outer quadrant of left breast in female, estrogen receptor negative (HCC) 04/09/2018   Transient ischemic attack 02/13/2018   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   Mixed hyperlipidemia 02/06/2018   Asthma, intermittent, uncomplicated 07/09/2017   Cellulitis of neck 06/24/2017   Sialadenitis 06/24/2017   Lactic acidosis 06/24/2017   Salivary duct stone 06/24/2017   Essential hypertension    History of TIA (transient ischemic attack)    Diabetes mellitus without complication (HCC)    Arthritis    History of breast cancer     H/O mastectomy    History of breast reconstruction    Heart murmur 01/13/2014   Obesity (BMI 30.0-34.9) 01/13/2014    Orientation RESPIRATION BLADDER Height & Weight     Self, Time, Situation, Place  O2 (4L [place]) Continent, External catheter Weight: 179 lb 14.3 oz (81.6 kg) Height:  5\' 3"  (160 cm)  BEHAVIORAL SYMPTOMS/MOOD NEUROLOGICAL BOWEL NUTRITION STATUS      Continent Diet (See dc summary)  AMBULATORY STATUS COMMUNICATION OF NEEDS Skin   Extensive Assist Verbally Normal                       Personal Care Assistance Level of Assistance  Bathing, Dressing, Feeding Bathing Assistance: Maximum assistance Feeding assistance: Limited assistance Dressing Assistance: Maximum assistance     Functional Limitations Info  Sight, Hearing, Speech Sight Info: Impaired (eyeglasses) Hearing Info: Adequate Speech Info: Adequate    SPECIAL CARE FACTORS FREQUENCY  PT (By licensed PT), OT (By licensed OT)     PT Frequency: 5x week OT Frequency: 5x week            Contractures Contractures Info: Not present    Additional Factors Info  Code Status, Allergies, Psychotropic, Insulin  Sliding Scale Code Status Info: Full Allergies Info: Grass Pollen(k-o-r-t-swt Vern), Latex, Tetracyclines & Related Psychotropic Info: Buspar , seroquel  Insulin  Sliding Scale Info: see dc summary       Current Medications (05/26/2024):  This is the current hospital active medication list Current Facility-Administered Medications  Medication Dose Route Frequency Provider Last Rate Last Admin   acetaminophen  (TYLENOL ) tablet 650  mg  650 mg Oral Q6H PRN Reesa Cannon N, DO       amLODipine  (NORVASC ) tablet 10 mg  10 mg Oral Daily Arne Langdon, MD   10 mg at 05/26/24 1028   aspirin  EC tablet 81 mg  81 mg Oral Daily Arne Langdon, MD   81 mg at 05/26/24 1028   busPIRone  (BUSPAR ) tablet 10 mg  10 mg Oral BID Arne Langdon, MD   10 mg at 05/26/24 1028   clopidogrel  (PLAVIX ) tablet 75 mg  75 mg Oral Daily  Arne Langdon, MD   75 mg at 05/26/24 1028   cyanocobalamin (VITAMIN B12) injection 1,000 mcg  1,000 mcg Intramuscular Q1200 Althia Atlas, MD   1,000 mcg at 05/26/24 1029   Followed by   Cecily Cohen ON 05/28/2024] cyanocobalamin (VITAMIN B12) tablet 1,000 mcg  1,000 mcg Oral Daily Althia Atlas, MD       doxazosin  (CARDURA ) tablet 4 mg  4 mg Oral BID Arne Langdon, MD   4 mg at 05/26/24 1028   enoxaparin  (LOVENOX ) injection 40 mg  40 mg Subcutaneous Daily Reesa Cannon N, DO   40 mg at 05/26/24 1028   [START ON 05/27/2024] furosemide  (LASIX ) injection 20 mg  20 mg Intravenous Daily Althia Atlas, MD       hydrALAZINE  (APRESOLINE ) injection 10 mg  10 mg Intravenous Q6H PRN Althia Atlas, MD   10 mg at 05/25/24 1644   Or   hydrALAZINE  (APRESOLINE ) tablet 50 mg  50 mg Oral Q6H PRN Althia Atlas, MD   50 mg at 05/24/24 1109   hydrOXYzine  (ATARAX ) tablet 10 mg  10 mg Oral Daily PRN Arne Langdon, MD   10 mg at 05/25/24 1740   insulin  aspart (novoLOG ) injection 0-5 Units  0-5 Units Subcutaneous QHS Bary Boss, DO   3 Units at 05/24/24 2147   insulin  aspart (novoLOG ) injection 0-9 Units  0-9 Units Subcutaneous TID WC Reesa Cannon N, DO   5 Units at 05/26/24 1159   ipratropium-albuterol  (DUONEB) 0.5-2.5 (3) MG/3ML nebulizer solution 3 mL  3 mL Nebulization Q6H PRN Arne Langdon, MD   3 mL at 05/24/24 1836   isosorbide mononitrate (IMDUR) 24 hr tablet 60 mg  60 mg Oral QPM Althia Atlas, MD   60 mg at 05/25/24 1622   melatonin tablet 5 mg  5 mg Oral QHS PRN Bary Boss, DO       metoprolol  tartrate (LOPRESSOR ) tablet 100 mg  100 mg Oral BID Althia Atlas, MD   100 mg at 05/26/24 1028   ondansetron  (ZOFRAN -ODT) disintegrating tablet 4 mg  4 mg Oral Q8H PRN Arne Langdon, MD       [START ON 05/27/2024] paliperidone  (INVEGA ) 24 hr tablet 1.5 mg  1.5 mg Oral Daily Gwyndolyn Lerner, MD       polyethylene glycol (MIRALAX  / GLYCOLAX ) packet 17 g  17 g Oral Daily PRN Reesa Cannon N, DO       prochlorperazine   (COMPAZINE ) injection 5 mg  5 mg Intravenous Q6H PRN Reesa Cannon N, DO       QUEtiapine  (SEROQUEL ) tablet 100 mg  100 mg Oral QHS Arne Langdon, MD   100 mg at 05/25/24 2115   thiamine (VITAMIN B1) tablet 100 mg  100 mg Oral Daily Althia Atlas, MD   100 mg at 05/26/24 1028   Vitamin D (Ergocalciferol) (DRISDOL) 1.25 MG (50000 UNIT) capsule 50,000 Units  50,000 Units Oral Q7 days Althia Atlas, MD   50,000 Units at  05/25/24 1138     Discharge Medications: Please see discharge summary for a list of discharge medications.  Relevant Imaging Results:  Relevant Lab Results:   Additional Information SSN [id number]  Arron Big, LCSWA

## 2024-05-26 NOTE — Progress Notes (Signed)
   05/26/24 2233  BiPAP/CPAP/SIPAP  $ Non-Invasive Ventilator  Non-Invasive Vent Subsequent  BiPAP/CPAP/SIPAP Pt Type Adult  BiPAP/CPAP/SIPAP SERVO  Mask Type Full face mask  Mask Size Medium  Set Rate 12 breaths/min  Respiratory Rate 18 breaths/min  Pressure Support 5 cmH20  PEEP 5 cmH20  FiO2 (%) 45 %  Minute Ventilation 12.1  Leak 22  Peak Inspiratory Pressure (PIP) 9  Tidal Volume (Vt) 602  Patient Home Machine No  Patient Home Mask No  Patient Home Tubing No  Auto Titrate No  Press High Alarm 30 cmH2O  Press Low Alarm 5 cmH2O  Device Plugged into RED Power Outlet Yes  BiPAP/CPAP /SiPAP Vitals  Resp 18  MEWS Score/Color  MEWS Score 0  MEWS Score Color Beth Blankenship

## 2024-05-27 DIAGNOSIS — I5033 Acute on chronic diastolic (congestive) heart failure: Secondary | ICD-10-CM | POA: Diagnosis not present

## 2024-05-27 LAB — BASIC METABOLIC PANEL WITH GFR
Anion gap: 9 (ref 5–15)
BUN: 24 mg/dL — ABNORMAL HIGH (ref 8–23)
CO2: 26 mmol/L (ref 22–32)
Calcium: 8.8 mg/dL — ABNORMAL LOW (ref 8.9–10.3)
Chloride: 105 mmol/L (ref 98–111)
Creatinine, Ser: 1.23 mg/dL — ABNORMAL HIGH (ref 0.44–1.00)
GFR, Estimated: 47 mL/min — ABNORMAL LOW (ref >60)
Glucose, Bld: 162 mg/dL — ABNORMAL HIGH (ref 70–99)
Potassium: 3.8 mmol/L (ref 3.5–5.1)
Sodium: 140 mmol/L (ref 135–145)

## 2024-05-27 LAB — GLUCOSE, CAPILLARY
Glucose-Capillary: 201 mg/dL — ABNORMAL HIGH (ref 70–99)
Glucose-Capillary: 271 mg/dL — ABNORMAL HIGH (ref 70–99)
Glucose-Capillary: 177 mg/dL — ABNORMAL HIGH (ref 70–99)
Glucose-Capillary: 184 mg/dL — ABNORMAL HIGH (ref 70–99)

## 2024-05-27 LAB — CBC
HCT: 35.3 % — ABNORMAL LOW (ref 36.0–46.0)
Hemoglobin: 11 g/dL — ABNORMAL LOW (ref 12.0–15.0)
MCH: 27.9 pg (ref 26.0–34.0)
MCHC: 31.2 g/dL (ref 30.0–36.0)
MCV: 89.6 fL (ref 80.0–100.0)
Platelets: 206 10*3/uL (ref 150–400)
RBC: 3.94 MIL/uL (ref 3.87–5.11)
RDW: 14.4 % (ref 11.5–15.5)
WBC: 7 10*3/uL (ref 4.0–10.5)
nRBC: 0 % (ref 0.0–0.2)

## 2024-05-27 MED ORDER — FUROSEMIDE 20 MG PO TABS
20.0000 mg | ORAL_TABLET | Freq: Every day | ORAL | Status: DC
  Administered 2024-05-27: 20 mg via ORAL
  Filled 2024-05-27: qty 1

## 2024-05-27 MED ORDER — PALIPERIDONE ER 1.5 MG PO TB24
1.5000 mg | ORAL_TABLET | Freq: Every day | ORAL | Status: DC
  Administered 2024-05-28: 1.5 mg via ORAL
  Filled 2024-05-27: qty 1

## 2024-05-27 NOTE — Progress Notes (Signed)
 Physical Therapy Treatment Patient Details Name: Beth Blankenship MRN: [id number] DOB: [birth year] Today's Date: 05/27/2024   History of Present Illness Pt is a 73 y/o F presenting to ED on 5/30 wtih SOB, admitted for acute CHF exacerbation. PMH includes HTN, CVA, breast CA p/w SOB, elevated BNP    PT Comments  Patient resting in bed and finished up with lunch, agreeable to mobilize with therapy. Min assist required for supine>sit with good effort to follow cues for sequence/initiation moving LE's and reaching for bed rail. Pt able to maintain seated balance with supervision with feet on floor. Mod assist for transfers and gait with strong posterior lean and therapist blocking Rt LE to prevent anterior slide with stand and manual assist to weight shift trunk anteriorly for improved standing. Pt transferred bed>chair and completed 3x sit<>stand from recliner with 2 bouts of gait and chair follow for safety. Pt noted to be incontinent of stool and required max assist for pericare in standing. RN notified of pt OOB, family present, and need for new purewick and bed linen change. Will continue to progress pt as able in acute setting.      If plan is discharge home, recommend the following: A lot of help with bathing/dressing/bathroom;Assistance with cooking/housework;Direct supervision/assist for medications management;Assist for transportation;A lot of help with walking and/or transfers   Can travel by private vehicle     No  Equipment Recommendations  Other (comment)    Recommendations for Other Services       Precautions / Restrictions Precautions Precautions: Fall Precaution/Restrictions Comments: 4L O2 baseline Restrictions Weight Bearing Restrictions Per Provider Order: No     Mobility  Bed Mobility Overal bed mobility: Needs Assistance Bed Mobility: Supine to Sit     Supine to sit: Min assist, HOB elevated, Used rails     General bed mobility comments: cues for sequencing  and initiation. pt able to walk LE's off EOB and raise/turn trunk with bed rail. min assist with bed pad to fully scoot hips to EOB.    Transfers Overall transfer level: Needs assistance Equipment used: Rolling walker (2 wheels) Transfers: Sit to/from Stand, Bed to chair/wheelchair/BSC Sit to Stand: Min assist, Mod assist   Step pivot transfers: Mod assist       General transfer comment: blocking Rt LE to prevent anterior slide during rise. Pt with Lt knee flexed and ankle plantarflexed throughout. pt required min-mod to rise and mod assist to stabilize and guide/stedy balance with move bed>chair. pt attempting to stand with bil UE on RW and tipping walker. Cues for bil UE power up from recliner technique and mod assist for anterior weight shift to support.    Ambulation/Gait Ambulation/Gait assistance: Mod assist Gait Distance (Feet): 8 Feet (2x8) Assistive device: Rolling walker (2 wheels) Gait Pattern/deviations: Step-through pattern, Decreased stride length Gait velocity: decr     General Gait Details: pt amb ~8' with RW for forward gait with close chair follow provided by spouse. mod assist to prevent posterior lean and stabilize balance and walker.   Stairs             Wheelchair Mobility     Tilt Bed    Modified Rankin (Stroke Patients Only)       Balance Overall balance assessment: Needs assistance Sitting-balance support: Feet supported Sitting balance-Leahy Scale: Fair     Standing balance support: During functional activity, Reliant on assistive device for balance Standing balance-Leahy Scale: Poor  Communication Communication Communication: No apparent difficulties  Cognition Arousal: Alert Behavior During Therapy: WFL for tasks assessed/performed, Flat affect   PT - Cognitive impairments: No apparent impairments                         Following commands: Intact      Cueing Cueing  Techniques: Verbal cues, Gestural cues  Exercises      General Comments        Pertinent Vitals/Pain Pain Assessment Pain Assessment: No/denies pain    Home Living                          Prior Function            PT Goals (current goals can now be found in the care plan section) Acute Rehab PT Goals Patient Stated Goal: get home soon PT Goal Formulation: With patient Time For Goal Achievement: 06/06/24 Potential to Achieve Goals: Fair Progress towards PT goals: Progressing toward goals    Frequency    Min 3X/week      PT Plan      Co-evaluation              AM-PAC PT "6 Clicks" Mobility   Outcome Measure  Help needed turning from your back to your side while in a flat bed without using bedrails?: A Lot Help needed moving from lying on your back to sitting on the side of a flat bed without using bedrails?: A Lot Help needed moving to and from a bed to a chair (including a wheelchair)?: A Little Help needed standing up from a chair using your arms (e.g., wheelchair or bedside chair)?: A Lot Help needed to walk in hospital room?: A Lot Help needed climbing 3-5 steps with a railing? : A Lot 6 Click Score: 13    End of Session   Activity Tolerance: Patient limited by fatigue Patient left: Other (comment) (transport chair to CT) Nurse Communication: Mobility status;Precautions PT Visit Diagnosis: Muscle weakness (generalized) (M62.81);Difficulty in walking, not elsewhere classified (R26.2);Other symptoms and signs involving the nervous system (R29.898)     Time: 1221-1300 PT Time Calculation (min) (ACUTE ONLY): 39 min  Charges:    $Gait Training: 8-22 mins $Therapeutic Activity: 23-37 mins PT General Charges $$ ACUTE PT VISIT: 1 Visit                     Tish Forge, DPT Acute Rehabilitation Services Office [phone]  05/27/24 1:55 PM

## 2024-05-27 NOTE — TOC Progression Note (Addendum)
 Transition of Care [facility]) - Progression Note    Patient Details  Name: Beth Blankenship MRN: [id number] Date of Birth: 11/04/1951  Transition of Care [facility]) CM/SW Contact  Arron Big, [place] Phone Number: 05/27/2024, 10:53 AM  Clinical Narrative:   CSW provided patient with medicare.gov ratings for accepting SNFs. Patient would like time to decide on a facility. CSW will follow up.   3:17 PM Patient and family deciding between two SNFs at this time.   TOC will continue to follow.            Skilled Nursing Rehab Facilities-   [email or web address]     Ratings out of 5 stars (5 the highest)    Name Address  Phone # Quality Care Staffing Health Inspection Overall  [facility] [address] [phone] 3 3 4 4   [facility] [address], [place] [phone] 2 2 2 2   [facility] [address] [place] [phone] 5 1 2 2   [facility] [id number], [place] [phone] 2 1 3 2   [address] ([place]) [address], [place] [phone] 2 3 3 3   [facility] ([place] [address], [place] [phone] 3 1 1 1    ** = means Nursing homes that have been cited for potential issues related to abuse have the following icon next to their name    Expected Discharge Plan: Skilled Nursing Facility Barriers to Discharge: Continued Medical Work up, SNF Pending bed offer, English as a second language teacher  Expected Discharge Plan and Services In-house Referral: Clinical Social Work     Living arrangements for the past 2 months: Single Family Home                                       Social Determinants of Health (SDOH) Interventions SDOH Screenings   Food Insecurity: No Food Insecurity (05/23/2024)  Housing: Unknown (05/23/2024)  Recent Concern: Housing - High Risk (05/16/2024)   Received from [facility]  Transportation Needs: No Transportation Needs (05/23/2024)   Utilities: Not At Risk (05/23/2024)  Depression (PHQ2-9): High Risk (10/31/2022)  Financial Resource Strain: Medium Risk (05/16/2024)   Received from [facility]  Physical Activity: Unknown (08/09/2022)   Received from [facility], [facility]  Recent Concern: Physical Activity - Inactive (08/09/2022)   Received from [facility]  Social Connections: Unknown (05/23/2024)  Stress: No Stress Concern Present (08/19/2023)   Received from [facility]  Tobacco Use: Medium Risk (05/16/2024)   Received from [facility]    Readmission Risk Interventions     No data to display

## 2024-05-27 NOTE — Progress Notes (Addendum)
 [facility] Progress Note  Patient: Beth Blankenship    NUU:[id number]  DOA: 05/23/2024     Date of Service: the patient was seen and examined on 05/27/2024  Chief Complaint  Patient presents with   Shortness of Breath   Brief hospital course: Beth Blankenship is a 73 y.o. female with medical history significant of HTN, CVA, and breast cancer p/w SOB, and elevated BNP c/w HFpEF exacerbation.   Pt was in her USOH until 1600 yesterday she experienced SOB while watching television. She turned up her home O2 from 4L [place] to 5L [place] and had mild to no improvement. She continued throughout the day with SOB that worsened with mild activity like getting up to go to the restroom. She became increasingly concerned when she went to bed at 2300 and was unable to lay flat; in fact, she felt like she was "drowning" and activated EMS. Of note, the patient reports eating out multiple times a week and does not adhere to a low salt diet.   In the ED, pt bradycardic and hypertensive. Labs notable for Cr 1.36 (baseline 0.8) and BNP 916. CXR with b/l pulmonary edema. Pt admitted to medicine for diuresis.   Assessment and Plan: # HFpEF exacerbation # Elevated BNP # SOB/DOE -RD consulted for low sodium diet education; apprec eval/recs -s/p IV lasix  40mg  BID; goal net neg 1-2L/d; strict I/Os; daily standing weights; K>4/Mg>2 -PTA metoprolol  100mg  BID and ASA 81mg  daily -Consider increasing pta diuretics on discharge TTE LVEF 60-65%, moderately decreased RV systolic function, severe PAH 6/1 Lasix  40 mg a.m. dose given, held Lasix  for now due to elevated creatinine 6/2 creatinine 1.44 still elevated, hold Lasix  for now 6/3 sCr 1.23 improved, started Lasix  20 mg p.o. daily Monitor renal functions and titrate dose accordingly   # Hypokalemia due to diuresis, K repleted. Monitor electrolytes.  # AKI on CKD stage II Baseline sCr 0.70 6/3 sCr 1.23 still elevated but improving Monitor renal functions and  urine output Daily    # Severe pulmonary hypertension  RVSP 86.5 mmHg; presumably related to volume overload from above -Diuresis per above - Recommended to follow with pulmonologist as an outpatient for PFTs to exclude COPD vs sleep study to rule out OSA    H/o HTN -PTA amlodipine    H/o DM2 -SSI TID AC prn    H/o CVA -PTA aspirin  and plavix    H/o Subarachnoid hemorrhage and subdural hematoma s/p fall on 5/1 5/1 CT Head: 1. Small volume of right frontal convexity subarachnoid hemorrhage is mildly increased.  2. No substantial change in size of a right cerebral convexity subdural hematoma which measures up to approximately 5 mm in thickness. No midline shift.  5/31 CT Head: 1. Interval decrease in size of right frontoparietal convexity subdural hemorrhage, now measuring up to 3 mm. 2. Interval resolution of the previously seen subarachnoid hemorrhage along the right frontal lobe. 3. Stable background of chronic small vessel disease with old infarcts in the superior right cerebellar hemisphere and medial left occipital lobe.   H/o unspecified schizophrenia -PTA Invega  3mg  daily, buspar  and seroquel  # C/o tremors in the left hand and does not feel herself, weird feelings and lightheadedness. 6/1 Started thiamine 100 mg p.o. daily for 30 days EEG: Negative for seizures, generalized encephalopathy.  6/2 psych consulted for possible parkinsonian symptoms.  Psych decreased dose of Invega  from 3 to 1.5 mg p.o. daily.  Recommended neurology follow-up as an outpatient for Lewy body dementia workup.  Patient may  need MRI brain 6/3 psych will continue Invega  1.5 mg today and reassess tomorrow.  # Vitamin D deficiency: started vitamin D 50,000 units p.o. weekly, follow with PCP to repeat vitamin D level after 3 to 6 months.  # Vitamin B12 level 316, goal >400: Started vitamin B12 1000 mcg IM injection daily x 3  days, followed by oral supplement.  Follow-up PCP to repeat vitamin B12 level  after 3 to 6 months.   Body mass index is 31.63 kg/m.  Interventions:  Diet: Diabetic diet DVT Prophylaxis: Subcutaneous Lovenox    Advance goals of care discussion: Full code  Family Communication: family was present at bedside, at the time of interview.  The pt provided permission to discuss medical plan with the family. Opportunity was given to ask question and all questions were answered satisfactorily.   Disposition:  Pt is from Home, admitted with CHF exacerbation, s/p IV Lasix , has chronic problem with hallucinations and paranoid, possible side effects of Invega , psych consulted, Invega  discontinued.  Patient is to follow-up with neurology as an outpatient and MRI brain, she does not want inpatient consults.   Discharge to SNF, medically stable, TOC is following for placement   Subjective: No significant events overnight, today patient again stated that she is not feeling right, having weird feelings and feels lightheaded, no any other specific complaints. No shortness of breath, no chest pain or palpitations.    Physical Exam: General: NAD, lying comfortably Appear in no distress, affect appropriate Eyes: PERRLA ENT: Oral Mucosa Clear, moist  Neck: no JVD,  Cardiovascular: S1 and S2 Present, no Murmur,  Respiratory: Equal air entry bilaterally, mild bibasilar crackles, no wheezes.   Abdomen: Bowel Sound present, Soft and no tenderness,  Skin: no rashes Extremities: Mild 1+ pedal edema, no calf tenderness Neurologic: without any new focal findings Gait not checked due to patient safety concerns  Vitals:   05/27/24 0400 05/27/24 0732 05/27/24 0950 05/27/24 1053  BP: (!) 153/64 (!) 148/67  (!) 144/66  Pulse: (!) 56 60 63 64  Resp: 18 16  18   Temp: 98.5 F (36.9 C) 98.5 F (36.9 C)    TempSrc: Oral Oral    SpO2: 98% 98%  94%  Weight:      Height:        Intake/Output Summary (Last 24 hours) at 05/27/2024 1343 Last data filed at 05/27/2024 6045 Gross per 24 hour   Intake 238 ml  Output 400 ml  Net -162 ml   Filed Weights   05/25/24 0400 05/26/24 0530 05/27/24 0040  Weight: 80.4 kg 81.6 kg 81 kg    Data Reviewed: I have personally reviewed and interpreted daily labs, tele strips, imagings as discussed above. I reviewed all nursing notes, pharmacy notes, vitals, pertinent old records I have discussed plan of care as described above with RN and patient/family.  CBC: Recent Labs  Lab 05/23/24 0430 05/24/24 0920 05/25/24 0319 05/26/24 0302 05/27/24 0236  WBC 8.0 7.4 6.9 8.0 7.0  HGB 11.2* 11.7* 10.7* 10.4* 11.0*  HCT 36.4 37.0 34.3* 33.1* 35.3*  MCV 89.7 88.9 87.9 88.5 89.6  PLT 231 229 205 208 206   Basic Metabolic Panel: Recent Labs  Lab 05/23/24 0430 05/24/24 0920 05/25/24 0319 05/25/24 1010 05/26/24 0302 05/27/24 0236  NA 139 142 139  --  136 140  K 3.9 3.8 3.1*  --  3.8 3.8  CL 109 103 103  --  102 105  CO2 21* 27 28  --  27 26  GLUCOSE 262* 259* 166*  --  186* 162*  BUN 21 15 20   --  24* 24*  CREATININE 1.36* 1.12* 1.30*  --  1.44* 1.23*  CALCIUM  8.3* 8.8* 8.7*  --  8.4* 8.8*  MG 1.8 1.8  --  1.8 1.8  --   PHOS 4.0 3.5  --  4.7* 3.7  --     Studies: No results found.   Scheduled Meds:  amLODipine   10 mg Oral Daily   aspirin  EC  81 mg Oral Daily   busPIRone   10 mg Oral BID   clopidogrel   75 mg Oral Daily   [START ON 05/28/2024] vitamin B-12  1,000 mcg Oral Daily   doxazosin   4 mg Oral BID   enoxaparin  (LOVENOX ) injection  40 mg Subcutaneous Daily   furosemide   20 mg Oral Daily   insulin  aspart  0-5 Units Subcutaneous QHS   insulin  aspart  0-9 Units Subcutaneous TID WC   isosorbide mononitrate  60 mg Oral QPM   metoprolol  tartrate  100 mg Oral BID   QUEtiapine   100 mg Oral QHS   thiamine  100 mg Oral Daily   Vitamin D (Ergocalciferol)  50,000 Units Oral Q7 days   Continuous Infusions: PRN Meds: acetaminophen , hydrALAZINE  **OR** hydrALAZINE , hydrOXYzine , ipratropium-albuterol , melatonin, ondansetron ,  polyethylene glycol, prochlorperazine   Time spent: 40 minutes  Author: Althia Atlas. MD [facility] 05/27/2024 1:43 PM  To reach On-call, see care teams to locate the attending and reach out to them via [email or web address]. If 7PM-7AM, please contact night-coverage If you still have difficulty reaching the attending provider, please page the [facility] (Director on Call) for [facility] on amion for assistance.

## 2024-05-27 NOTE — Consult Note (Signed)
 Beth Blankenship  MRN: [id number]  DOB: 21-Sep-1951  Consult Order details:  Orders (From admission, onward)     Start     Ordered   05/26/24 1115  IP CONSULT TO PSYCHIATRY       Ordering Provider: Althia Atlas, MD  Provider:  (Not yet assigned)  Question Answer Comment  Location [facility]   Reason for Consult? please eval for Extrapyramidal symptoms, c/o left hand tremors and difficulty walking.      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 female admitted: Presented to the EDfor 05/23/2024  1:20 AM for HFpEF exacerbation. 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 She does not meet criteria for involuntary commitment based on no threat to self or others.  Current outpatient psychotropic medications include Invega  3 mg daily, Seroquel  100 mg nightly, BuSpar  10 mg twice daily and historically she has had a good response to these medications. She endorsed compliance with medications, but that husband was in charge of these.  Please see plan below for detailed recommendations.   Diagnoses:  Active Hospital problems: Principal Problem:   Acute exacerbation of CHF (congestive heart failure) (HCC)    Plan   ## Psychiatric Medication Recommendations:  - Continue Invega  1.5 mg daily for  paranoia -- on further chart review, it appears the patient becomes paranoid when she stops taking this medication. - Continue Seroquel  100 mg nightly. - Continue BuSpar  10 mg twice daily. - We will continue to follow to monitor EPS.  ## Medical Decision Making Capacity: Not specifically addressed in this encounter  ## Further Work-up:  - Recommend outpatient neurology follow-up for suspected parkinsonian symptoms and symptomatology suggestive of Dementia with Lewy body - Recommend MRI for further organic workup  - Agree with vitamin B12 repletion - Defer T2DM management at A1c: 8.6% to primary team While pt on Qtc prolonging medications, please monitor & replete K+ to 4 and Mg2+ to 2 -- most recent EKG on 5/30 had QtC of 452 -- Pertinent labwork reviewed earlier this admission includes: B12 316   ## Disposition:-- There are no psychiatric contraindications to discharge at this time  ## Behavioral / Environmental: - No specific recommendations at this time.     ## Safety and Observation Level:  - Based on my clinical evaluation, I estimate the patient to be at minimal risk of self harm in the current setting. - At this time, we recommend  routine. This decision is based on my review of the chart including patient's history and current presentation, interview of the patient, mental status examination, and consideration of suicide risk including evaluating suicidal ideation, plan, intent, suicidal or self-harm behaviors, risk factors, and protective factors. This judgment is based on our ability to directly address suicide risk, implement suicide prevention strategies, and develop a safety plan while  the patient is in the clinical setting. Please contact our team if there is a concern that risk level has changed.  CSSR Risk Category:C-SSRS RISK CATEGORY: No Risk  Suicide Risk Assessment: Patient has following modifiable risk factors for suicide: None Patient has following non-modifiable  or demographic risk factors for suicide: psychiatric hospitalization Patient has the following protective factors against suicide: Access to outpatient mental health care, Supportive family, Frustration tolerance, no history of suicide attempts, and no history of NSSIB  Thank you for this consult request. Recommendations have been communicated to the primary team.  We will continue to follow at this time.   Marinus Eicher, MD       History of Present Illness  Relevant Aspects of Hospital Course:  Admitted on 05/23/2024 for heart failure with preserved ejection fraction exacerbation patient was bradycardic and hypertensive in the ED.  Also noted AKI.  Started IV Lasix  40 mg twice daily.  Patient respiratory status improved.  Psychiatry was consulted for evaluation of extrapyramidal symptoms in the setting of dual antipsychotic therapy (Invega  3 mg daily, quetiapine  700 mg nightly).  Patient Report:   Thorough discussion with patient as well as husband Beth Blankenship.  This morning, patient "felt really weird" like "floating".  Says that sadness and anxiety were related to this feeling, and that she sometimes had this feeling when she was at home or when she stands up from a seated position.  Patient is unable to identify the cause of this feeling, which is similar to dizziness.  Denies further somatic symptomatology.  On discussion with husband, he confirms that patient first started having visual hallucinations in her late 42s.  Denies memory issues and disorientation, but also endorses previous paranoid thinking.  Husband said that patient did not see much benefit on Invega  in terms of her hallucinations, however, on further chart review later in the afternoon, it appears that patient becomes acutely paranoid concerning the devil speaking to her when she discontinues the paliperidone . We will therefore continue this medication at this reduced dose and monitor tomorrow.  Concerning potential EPS  symptomatology, husband says that patient's left hand shakes violently for 10 to 15 seconds and then resolves, and that this started happening over the past 2 weeks.  Some suspicion for focal seizure with intact awareness -- routine EEG was negative this hospitalization for epileptiform discharges or seizure activity.  Husband notes that patient has had multiple many strokes in the past, possible nidus for seizure.  Denies history of seizure.  Denies cognitive slowing, loss of direction and familiar places.  Short-term memory intact on exam, concentration intact.  No tremor noted on physical exam.  Will need outpatient neurologic workup, discussed this thoroughly with husband and patient.  Psych ROS:  Depression: Positive for the following neurovegetative symptoms over the last 2 weeks: Low mood, difficulty enjoying things she has previously enjoyed, low energy, difficulty concentrating, psychomotor slowing, and passive suicidal ideation "sometimes".  Last endorsed 2 days ago.  Denies history of active suicidal ideation.  Denies plan. Anxiety: Patient endorses general anxiety out of proportion to stressor about many aspects of her life. Mania (lifetime and current): Patient denies history of 4+ days of elevated and expansive mood including talkativeness, irritability, disinhibition, etc. Psychosis: (lifetime and current): As above, patient endorses new onset visual and auditory hallucinations around the age of 68.  Adamant that previous to that time, patient has never had hallucinations of any sort.  Review of Systems  Psychiatric/Behavioral:  Negative for hallucinations.  All other systems reviewed and are negative.    Psychiatric and Social History  Psychiatric History:  Information collected from patient  Prev Dx/Sx: Schizoaffective disorder, bipolar type; major depressive disorder, with psychotic features Current Psych Provider:  Home Meds (current): PO Invega  3 mg daily, Seroquel  100 mg  nightly, BuSpar  10 mg twice daily Previous Med Trials: Zoloft  150 mg (may be current med, will need to get in touch with husband) Therapy: Previously  Prior Psych Hospitalization: 2-3 times at [facility] Prior Self Harm: Denies Prior Violence: Denies  Family Psych History: Close family member had "a mental breakdown", however denies history of bipolar disorder and schizophrenia in immediate family Family Hx suicide: Denies  Social History:  Developmental Hx: Unremarkable Educational Hx: Patient has 3 master's degrees Occupational Hx: Retired Armed forces operational officer Hx: None Living Situation: Husband and daughters (2) Access to weapons/lethal means: Denies  Substance History Alcohol: Previously in college Last Drink unknown Number of drinks per day unknown History of alcohol withdrawal seizures denies History of DT's denies Tobacco: Remote Illicit drugs: Denies Prescription drug abuse: Denies Rehab hx: Denies  Exam Findings  Physical Exam:  Vital Signs:  Temp:  [97.8 F (36.6 C)-98.5 F (36.9 C)] 98.5 F (36.9 C) (06/03 0732) Pulse Rate:  [49-67] 60 (06/03 0732) Resp:  [16-21] 16 (06/03 0732) BP: (101-156)/(51-70) 148/67 (06/03 0732) SpO2:  [95 %-98 %] 98 % (06/03 0732) FiO2 (%):  [45 %] 45 % (06/02 2233) Weight:  [81 kg] 81 kg (06/03 0040) Blood pressure (!) 148/67, pulse 60, temperature 98.5 F (36.9 C), temperature source Oral, resp. rate 16, height 5\' 3"  (1.6 m), weight 81 kg, SpO2 98%. Body mass index is 31.63 kg/m.  Physical Exam Constitutional:      General: She is not in acute distress.    Appearance: She is normal weight.  Pulmonary:     Effort: Pulmonary effort is normal. No tachypnea.  Neurological:     General: No focal deficit present.     Mental Status: She is alert.     Comments: Cogwheeling rigidity noted in bilateral elbows and wrists on physical exam, similar to yesterday.     Mental Status Exam: General Appearance: Casual, flat faces  Orientation:  Full  (Time, Place, and Person)  Memory:  Immediate;   Fair Recent;   Good  Concentration:  Concentration: Fair and Attention Span: Fair  Recall:  Fair  Attention  Good  Eye Contact:  Good  Speech:  Slow and some speech latency  Language:  Good  Volume:  Normal  Mood: Depressed  Affect:  Flat  Thought Process:  Coherent, Goal Directed, and Linear  Thought Content:  Logical, Hallucinations: Previous, visual, auditory command (HI/SI), and Paranoid Ideation  Suicidal Thoughts:  No  Homicidal Thoughts:  No  Judgement:  Fair  Insight:  Fair  Psychomotor Activity:  EPS, cogwheeling rigidity noted in bilateral elbows and wrists  Akathisia:  No  Fund of Knowledge: Fair      Assets:  Desire for Improvement Housing Social Support  Cognition:  Intact  ADL's:  Impaired  AIMS (if indicated): 1, 0, 0, 3, 1 (6/2)     Other History   These have been pulled in through the EMR, reviewed, and updated if appropriate.  Family History:  The patient's family history includes Cancer in her maternal grandmother; Diabetes in her paternal aunt and paternal uncle; Heart disease in her father and mother; Hypertension in her mother; Stroke in her mother.  Medical History: Past Medical History:  Diagnosis Date   Arthritis    Cancer (HCC)    Diabetes mellitus without complication (HCC)    H/O mastectomy    left side   History of breast reconstruction    Hypertension    Stroke [facility])    mini strokes    Surgical History: Past Surgical History:  Procedure Laterality Date   MASTECTOMY Left 04/29/2015   REDUCTION MAMMAPLASTY Right    SHOULDER SURGERY Right    TUBAL LIGATION       Medications:   Current Facility-Administered Medications:    acetaminophen  (TYLENOL ) tablet 650 mg, 650 mg, Oral, Q6H PRN, Hall, Carole N, DO   amLODipine  (NORVASC ) tablet 10 mg, 10 mg, Oral, Daily, Arne Langdon, MD, 10 mg at 05/26/24 1028   aspirin  EC tablet 81 mg, 81 mg, Oral, Daily, Arne Langdon, MD, 81 mg at  05/26/24 1028   busPIRone  (BUSPAR ) tablet 10 mg, 10 mg, Oral, BID, Arne Langdon, MD, 10 mg at 05/26/24 2211   clopidogrel  (PLAVIX ) tablet 75 mg, 75 mg, Oral, Daily, Arne Langdon, MD, 75 mg at 05/26/24 1028   cyanocobalamin (VITAMIN B12) injection 1,000 mcg, 1,000 mcg, Intramuscular, Q1200, 1,000 mcg at 05/26/24 1029 **FOLLOWED BY** [START ON 05/28/2024] cyanocobalamin (VITAMIN B12) tablet 1,000 mcg, 1,000 mcg, Oral, Daily, Althia Atlas, MD   doxazosin  (CARDURA ) tablet 4 mg, 4 mg, Oral, BID, Arne Langdon, MD, 4 mg at 05/26/24 2212   enoxaparin  (LOVENOX ) injection 40 mg, 40 mg, Subcutaneous, Daily, Hall, Carole N, DO, 40 mg at 05/26/24 1028   furosemide  (LASIX ) injection 20 mg, 20 mg, Intravenous, Daily, Althia Atlas, MD   hydrALAZINE  (APRESOLINE ) injection 10 mg, 10 mg, Intravenous, Q6H PRN, 10 mg at 05/25/24 1644 **OR** hydrALAZINE  (APRESOLINE ) tablet 50 mg, 50 mg, Oral, Q6H PRN, Althia Atlas, MD, 50 mg at 05/24/24 1109   hydrOXYzine  (ATARAX ) tablet 10 mg, 10 mg, Oral, Daily PRN, Arne Langdon, MD, 10 mg at 05/26/24 2212   insulin  aspart (novoLOG ) injection 0-5 Units, 0-5 Units, Subcutaneous, QHS, Hall, Carole N, DO, 2 Units at 05/26/24 2210   insulin  aspart (novoLOG ) injection 0-9 Units, 0-9 Units, Subcutaneous, TID WC, Reesa Cannon N, DO, 3 Units at 05/27/24 1610   ipratropium-albuterol  (DUONEB) 0.5-2.5 (3) MG/3ML nebulizer solution 3 mL, 3 mL, Nebulization, Q6H PRN, Arne Langdon, MD, 3 mL at 05/24/24 1836   isosorbide mononitrate (IMDUR) 24 hr tablet 60 mg, 60 mg, Oral, QPM, Althia Atlas, MD, 60 mg at 05/26/24 1717   melatonin tablet 5 mg, 5 mg, Oral, QHS PRN, [place], Carole N, DO   metoprolol  tartrate (LOPRESSOR ) tablet 100 mg, 100 mg, Oral, BID, Althia Atlas, MD, 100 mg at 05/26/24 2211   ondansetron  (ZOFRAN -ODT) disintegrating tablet 4 mg, 4 mg, Oral, Q8H PRN, Arne Langdon, MD   paliperidone  (INVEGA ) 24 hr tablet 1.5 mg, 1.5 mg, Oral, Daily, Gwyndolyn Lerner, MD   polyethylene glycol  (MIRALAX  / GLYCOLAX ) packet 17 g, 17 g, Oral, Daily PRN, Hall, Carole N, DO   prochlorperazine  (COMPAZINE ) injection 5 mg, 5 mg, Intravenous, Q6H PRN, Hall, Carole N, DO   QUEtiapine  (SEROQUEL ) tablet 100 mg, 100 mg, Oral, QHS, Arne Langdon, MD, 100 mg at 05/26/24 2211   thiamine (VITAMIN B1) tablet 100 mg, 100 mg, Oral, Daily, Althia Atlas, MD, 100 mg at 05/26/24 1028   Vitamin D (Ergocalciferol) (DRISDOL) 1.25 MG (50000 UNIT) capsule 50,000 Units, 50,000 Units, Oral, Q7 days, Althia Atlas, MD, 50,000 Units at 05/25/24 1138  Allergies: Allergies  Allergen Reactions   Grass Pollen(K-[id number]-Swt Vern)  Latex Hives   Tetracyclines & Related Hives    Prakriti Carignan, MD

## 2024-05-27 NOTE — Plan of Care (Signed)
  Problem: Coping: Goal: Ability to adjust to condition or change in health will improve Outcome: Progressing   Problem: Health Behavior/Discharge Planning: Goal: Ability to identify and utilize available resources and services will improve Outcome: Progressing   Problem: Metabolic: Goal: Ability to maintain appropriate glucose levels will improve Outcome: Progressing

## 2024-05-27 NOTE — Plan of Care (Signed)
  Problem: Education: Goal: Knowledge of General Education information will improve Description: Including pain rating scale, medication(s)/side effects and non-pharmacologic comfort measures Outcome: Progressing   Problem: Clinical Measurements: Goal: Will remain free from infection Outcome: Progressing   Problem: Health Behavior/Discharge Planning: Goal: Ability to manage health-related needs will improve Outcome: Not Progressing   Problem: Clinical Measurements: Goal: Respiratory complications will improve Outcome: Not Progressing   Problem: Coping: Goal: Level of anxiety will decrease Outcome: Not Progressing

## 2024-05-27 NOTE — Inpatient Diabetes Management (Signed)
 Inpatient Diabetes Program Recommendations  AACE/ADA: New Consensus Statement on Inpatient Glycemic Control (2015)  Target Ranges:  Prepandial:   less than 140 mg/dL      Peak postprandial:   less than 180 mg/dL (1-2 hours)      Critically ill patients:  140 - 180 mg/dL   Lab Results  Component Value Date   GLUCAP 271 (H) 05/27/2024   HGBA1C 8.6 (H) 05/23/2024    Review of Glycemic Control  Diabetes history: DM2 Outpatient Diabetes medications: None, previously on Jardiance Current orders for Inpatient glycemic control: Novolog  0-9 units TID with meals and 0-5 HS  HgbA1C - 8.6% CBGs 201, 271 today  Inpatient Diabetes Program Recommendations:    If post-prandials continue above goal, add Novolog  2 units TID with meal  Continue to follow.  Thank you. Joni Net, RD, LDN, [facility] Inpatient Diabetes Coordinator [phone]

## 2024-05-28 DIAGNOSIS — I5033 Acute on chronic diastolic (congestive) heart failure: Secondary | ICD-10-CM | POA: Diagnosis not present

## 2024-05-28 LAB — BASIC METABOLIC PANEL WITH GFR
Anion gap: 6 (ref 5–15)
BUN: 25 mg/dL — ABNORMAL HIGH (ref 8–23)
CO2: 25 mmol/L (ref 22–32)
Calcium: 8.7 mg/dL — ABNORMAL LOW (ref 8.9–10.3)
Chloride: 107 mmol/L (ref 98–111)
Creatinine, Ser: 1.05 mg/dL — ABNORMAL HIGH (ref 0.44–1.00)
GFR, Estimated: 56 mL/min — ABNORMAL LOW (ref >60)
Glucose, Bld: 169 mg/dL — ABNORMAL HIGH (ref 70–99)
Potassium: 4 mmol/L (ref 3.5–5.1)
Sodium: 138 mmol/L (ref 135–145)

## 2024-05-28 LAB — GLUCOSE, CAPILLARY
Glucose-Capillary: 192 mg/dL — ABNORMAL HIGH (ref 70–99)
Glucose-Capillary: 212 mg/dL — ABNORMAL HIGH (ref 70–99)
Glucose-Capillary: 195 mg/dL — ABNORMAL HIGH (ref 70–99)
Glucose-Capillary: 261 mg/dL — ABNORMAL HIGH (ref 70–99)

## 2024-05-28 LAB — CBC
HCT: 33.6 % — ABNORMAL LOW (ref 36.0–46.0)
Hemoglobin: 10.4 g/dL — ABNORMAL LOW (ref 12.0–15.0)
MCH: 27.8 pg (ref 26.0–34.0)
MCHC: 31 g/dL (ref 30.0–36.0)
MCV: 89.8 fL (ref 80.0–100.0)
Platelets: 196 10*3/uL (ref 150–400)
RBC: 3.74 MIL/uL — ABNORMAL LOW (ref 3.87–5.11)
RDW: 14.1 % (ref 11.5–15.5)
WBC: 5.7 10*3/uL (ref 4.0–10.5)
nRBC: 0 % (ref 0.0–0.2)

## 2024-05-28 MED ORDER — ATORVASTATIN CALCIUM 40 MG PO TABS
40.0000 mg | ORAL_TABLET | Freq: Every day | ORAL | Status: DC
  Administered 2024-05-28 – 2024-05-30 (×3): 40 mg via ORAL
  Filled 2024-05-28 (×3): qty 1

## 2024-05-28 MED ORDER — FUROSEMIDE 10 MG/ML IJ SOLN
40.0000 mg | Freq: Once | INTRAMUSCULAR | Status: AC
  Administered 2024-05-28: 40 mg via INTRAVENOUS
  Filled 2024-05-28: qty 4

## 2024-05-28 MED ORDER — ARIPIPRAZOLE 2 MG PO TABS
3.0000 mg | ORAL_TABLET | Freq: Every day | ORAL | Status: DC
  Administered 2024-05-28 – 2024-05-30 (×3): 3 mg via ORAL
  Filled 2024-05-28 (×3): qty 2

## 2024-05-28 NOTE — Inpatient Diabetes Management (Signed)
 Inpatient Diabetes Program Recommendations  AACE/ADA: New Consensus Statement on Inpatient Glycemic Control (2015)  Target Ranges:  Prepandial:   less than 140 mg/dL      Peak postprandial:   less than 180 mg/dL (1-2 hours)      Critically ill patients:  140 - 180 mg/dL   Lab Results  Component Value Date   GLUCAP 192 (H) 05/28/2024   HGBA1C 8.6 (H) 05/23/2024    Review of Glycemic Control  Diabetes history: DM2 Outpatient Diabetes medications: Jardiance 10 mg daily Current orders for Inpatient glycemic control: Novolog  0-9 units TID with meals and 0-5 HS  CBGs 169, 192 mg/dL [id number] - 9.8%  Inpatient Diabetes Program Recommendations:    Spoke with pt at bedside regarding her diabetes control and HgbA1C of 8.6%. Pt states she has been eating out recently d/t moving in new house, therefore eating out more than usual. Sees PCP on a regular basis, monitors blood sugars 1-2x/day. Answered all questions.   Thank you. Joni Net, RD, LDN, [facility] Inpatient Diabetes Coordinator [phone]

## 2024-05-28 NOTE — Progress Notes (Addendum)
 PROGRESS NOTE    Beth Blankenship  NUU:[id number] DOB: 07-Aug-1951 DOA: 05/23/2024 PCP: Patient, No Pcp Per   Brief Narrative:  Beth Blankenship is a 73 y.o. female with medical history significant of HTN, CVA, and breast cancer p/w SOB, and elevated BNP c/w HFpEF exacerbation.   Pt was in her USOH until 1600 yesterday she experienced SOB while watching television. She turned up her home O2 from 4L [place] to 5L [place] and had mild to no improvement. She continued throughout the day with SOB that worsened with mild activity like getting up to go to the restroom. She became increasingly concerned when she went to bed at 2300 and was unable to lay flat; in fact, she felt like she was "drowning" and activated EMS. Of note, the patient reports eating out multiple times a week and does not adhere to a low salt diet.   In the ED, pt bradycardic and hypertensive. Labs notable for Cr 1.36 (baseline 0.8) and BNP 916. CXR with b/l pulmonary edema. Pt admitted to medicine for diuresis.  Assessment & Plan:   Principal Problem:   Acute exacerbation of CHF (congestive heart failure) (HCC)  Acute hypoxic respiratory secondary to congestive heart failure with preserved ejection fraction, POA: -PTA metoprolol  100mg  BID and ASA 81mg  daily TTE LVEF 60-65%, moderately decreased RV systolic function, severe PAH 6/1 Lasix  40 mg a.m. dose given, held Lasix  on 05/26/2024 due to elevated creatinine 6/3 sCr 1.23 improved, started Lasix  20 mg p.o. daily.  Patient's creatinine is back to baseline now.  I will trial IV Lasix  with 40 mg x 1 dose today.  She is currently on 4 L of oxygen  and complains of shortness of breath and has crackles on examination.  Will wean oxygen  as able to.  Monitor strict I's and O's, daily weight and low-sodium.   # AKI on CKD stage II rule out.  AKI ruled in. Baseline sCr 0.70.  Presented with creatinine of 1.26 which peaked at 1.44.  Improving and down to 1.05.  Will need to avoid all nephrotoxic  agents but needs some challenge with the IV Lasix  due to CHF.  # Severe pulmonary hypertension  RVSP 86.5 mmHg; presumably related to volume overload from above -Diuresis per above - Recommended to follow with pulmonologist as an outpatient for PFTs to exclude COPD vs sleep study to rule out OSA   H/o HTN - Blood pressure fairly controlled continue PTA amlodipine    H/o DM2 -SSI TID AC prn    H/o CVA -PTA aspirin  and plavix   Hyperlipidemia: After reviewing entire chart diligently, I found that patient was tested for lipid panel on 05/24/2024 which indicates triglycerides of 176 and LDL of 138.  Patient not on any statins.  Starting on atorvastatin  40 mg p.o. daily.   H/o Subarachnoid hemorrhage and subdural hematoma s/p fall on 5/1 5/1 CT Head: 1. Small volume of right frontal convexity subarachnoid hemorrhage is mildly increased.  2. No substantial change in size of a right cerebral convexity subdural hematoma which measures up to approximately 5 mm in thickness. No midline shift.  5/31 CT Head: 1. Interval decrease in size of right frontoparietal convexity subdural hemorrhage, now measuring up to 3 mm. 2. Interval resolution of the previously seen subarachnoid hemorrhage along the right frontal lobe. 3. Stable background of chronic small vessel disease with old infarcts in the superior right cerebellar hemisphere and medial left occipital lobe.     H/o unspecified schizophrenia -PTA Invega  3mg  daily, buspar   and seroquel  # C/o tremors in the left hand and does not feel herself, weird feelings and lightheadedness. 6/1 Started thiamine 100 mg p.o. daily for 30 days EEG: Negative for seizures, generalized encephalopathy.  6/2 psych consulted for possible parkinsonian symptoms.  Psych decreased dose of Invega  from 3 to 1.5 mg p.o. daily.  Recommended neurology follow-up as an outpatient for Lewy body dementia workup.  Patient may need MRI brain but patient refusing MRI brain. 6/3 psych  will continue Invega  1.5 mg today and reassess tomorrow.   # Vitamin D deficiency: started vitamin D 50,000 units p.o. weekly, follow with PCP to repeat vitamin D level after 3 to 6 months.   # Vitamin B12 level 316, goal >400: Started vitamin B12 1000 mcg IM injection daily x 3  days, followed by oral supplement.  Follow-up PCP to repeat vitamin B12 level after 3 to 6 months.  DVT prophylaxis: enoxaparin  (LOVENOX ) injection 40 mg Start: 05/23/24 1000   Code Status: Full Code  Family Communication:  None present at bedside.  Plan of care discussed with patient in length and he/she verbalized understanding and agreed with it.  Status is: Inpatient Remains inpatient appropriate because: Needs IV diuresis, still hypoxic.   Estimated body mass index is 31.24 kg/m as calculated from the following:   Height as of this encounter: 5\' 3"  (1.6 m).   Weight as of this encounter: 80 kg.    Nutritional Assessment: Body mass index is 31.24 kg/m.Aaron Aas Seen by dietician.  I agree with the assessment and plan as outlined below: Nutrition Status:        . Skin Assessment: I have examined the patient's skin and I agree with the wound assessment as performed by the wound care RN as outlined below:    Consultants:  Psychiatry  Procedures:  None  Antimicrobials:  Anti-infectives (From admission, onward)    None         Subjective: Patient seen and examined, complains of shortness of breath, no better than yesterday.  No other complaint.  Fully alert and oriented otherwise.  Objective: Vitals:   05/27/24 2337 05/28/24 0145 05/28/24 0455 05/28/24 0800  BP: (!) 143/73  (!) 146/72 (!) 167/74  Pulse: (!) 59  (!) 58 60  Resp: 19  19 15   Temp: 98.5 F (36.9 C)  (!) 97.4 F (36.3 C) 97.8 F (36.6 C)  TempSrc: Oral  Oral Oral  SpO2: 97%  94% 96%  Weight:  80 kg    Height:        Intake/Output Summary (Last 24 hours) at 05/28/2024 1002 Last data filed at 05/27/2024 2300 Gross per 24  hour  Intake --  Output 100 ml  Net -100 ml   Filed Weights   05/26/24 0530 05/27/24 0040 05/28/24 0145  Weight: 81.6 kg 81 kg 80 kg    Examination:  General exam: Appears calm and comfortable  Respiratory system: Crackles at the bases bilaterally.  Respiratory effort normal. Cardiovascular system: S1 & S2 heard, RRR. No JVD, murmurs, rubs, gallops or clicks. No pedal edema. Gastrointestinal system: Abdomen is nondistended, soft and nontender. No organomegaly or masses felt. Normal bowel sounds heard. Central nervous system: Alert and oriented. No focal neurological deficits. Extremities: Symmetric 5 x 5 power. Skin: No rashes, lesions or ulcers   Data Reviewed: I have personally reviewed following labs and imaging studies  CBC: Recent Labs  Lab 05/24/24 0920 05/25/24 0319 05/26/24 0302 05/27/24 0236 05/28/24 0236  WBC 7.4 6.9 8.0 7.0  5.7  HGB 11.7* 10.7* 10.4* 11.0* 10.4*  HCT 37.0 34.3* 33.1* 35.3* 33.6*  MCV 88.9 87.9 88.5 89.6 89.8  PLT 229 205 208 206 196   Basic Metabolic Panel: Recent Labs  Lab 05/23/24 0430 05/24/24 0920 05/25/24 0319 05/25/24 1010 05/26/24 0302 05/27/24 0236 05/28/24 0236  NA 139 142 139  --  136 140 138  K 3.9 3.8 3.1*  --  3.8 3.8 4.0  CL 109 103 103  --  102 105 107  CO2 21* 27 28  --  27 26 25   GLUCOSE 262* 259* 166*  --  186* 162* 169*  BUN 21 15 20   --  24* 24* 25*  CREATININE 1.36* 1.12* 1.30*  --  1.44* 1.23* 1.05*  CALCIUM  8.3* 8.8* 8.7*  --  8.4* 8.8* 8.7*  MG 1.8 1.8  --  1.8 1.8  --   --   PHOS 4.0 3.5  --  4.7* 3.7  --   --    GFR: Estimated Creatinine Clearance: 48.5 mL/min (A) (by C-G formula based on SCr of 1.05 mg/dL (H)). Liver Function Tests: No results for input(s): "AST", "ALT", "ALKPHOS", "BILITOT", "PROT", "ALBUMIN" in the last 168 hours. No results for input(s): "LIPASE", "AMYLASE" in the last 168 hours. No results for input(s): "AMMONIA" in the last 168 hours. Coagulation Profile: No results for  input(s): "INR", "PROTIME" in the last 168 hours. Cardiac Enzymes: No results for input(s): "CKTOTAL", "CKMB", "CKMBINDEX", "TROPONINI" in the last 168 hours. BNP (last 3 results) No results for input(s): "PROBNP" in the last 8760 hours. HbA1C: No results for input(s): "HGBA1C" in the last 72 hours. CBG: Recent Labs  Lab 05/27/24 0612 05/27/24 1053 05/27/24 1603 05/27/24 2131 05/28/24 0636  GLUCAP 201* 271* 177* 184* 192*   Lipid Profile: No results for input(s): "CHOL", "HDL", "LDLCALC", "TRIG", "CHOLHDL", "LDLDIRECT" in the last 72 hours. Thyroid  Function Tests: No results for input(s): "TSH", "T4TOTAL", "FREET4", "T3FREE", "THYROIDAB" in the last 72 hours. Anemia Panel: No results for input(s): "VITAMINB12", "FOLATE", "FERRITIN", "TIBC", "IRON", "RETICCTPCT" in the last 72 hours. Sepsis Labs: No results for input(s): "PROCALCITON", "LATICACIDVEN" in the last 168 hours.  No results found for this or any previous visit (from the past 240 hours).   Radiology Studies: No results found.  Scheduled Meds:  amLODipine   10 mg Oral Daily   aspirin  EC  81 mg Oral Daily   atorvastatin   40 mg Oral Daily   busPIRone   10 mg Oral BID   clopidogrel   75 mg Oral Daily   vitamin B-12  1,000 mcg Oral Daily   doxazosin   4 mg Oral BID   enoxaparin  (LOVENOX ) injection  40 mg Subcutaneous Daily   furosemide   20 mg Oral Daily   insulin  aspart  0-5 Units Subcutaneous QHS   insulin  aspart  0-9 Units Subcutaneous TID WC   isosorbide mononitrate  60 mg Oral QPM   metoprolol  tartrate  100 mg Oral BID   paliperidone   1.5 mg Oral Daily   QUEtiapine   100 mg Oral QHS   thiamine  100 mg Oral Daily   Vitamin D (Ergocalciferol)  50,000 Units Oral Q7 days   Continuous Infusions:   LOS: 5 days   Modena Andes, MD [facility]  05/28/2024, 10:02 AM   *Please note that this is a verbal dictation therefore any spelling or grammatical errors are due to the "[employer] One" system  interpretation.  Please page via Amion and do not message via secure chat  for urgent patient care matters. Secure chat can be used for non urgent patient care matters.  How to contact the [facility] Attending or Consulting provider 7A - 7P or covering provider during after hours 7P -7A, for this patient?  Check the care team in [facility] and look for a) attending/consulting [facility] provider listed and b) the [facility] team listed. Page or secure chat 7A-7P. Log into [email or web address] and use [facility]'s universal password to access. If you do not have the password, please contact the hospital operator. Locate the [facility] provider you are looking for under [facility] and page to a number that you can be directly reached. If you still have difficulty reaching the provider, please page the [facility] (Director on Call) for the Hospitalists listed on amion for assistance.

## 2024-05-28 NOTE — Progress Notes (Signed)
 Occupational Therapy Treatment Patient Details Name: Beth Blankenship MRN: [id number] DOB: 02-Feb-1951 Today's Date: 05/28/2024   History of present illness Pt is a 73 y/o F presenting to ED on 5/30 wtih SOB, admitted for acute CHF exacerbation. PMH includes HTN, CVA, breast CA p/w SOB, elevated BNP   OT comments  Pt making gradual progress towards OT goals this session. Pt continues to present with impaired balance and pt specifically limited by increased anxiety this session and reports of SOB and dizziness even though VSS . Pt currently requires MIN A for bed mobility with pt able to sit EOB with CGA. Pt declined transfers d/t dizziness. Pt reported purewick to be leaking and opted to complete pericare from supine d/t dizziness. MAX A for LB ADLs and MIN A for UB ADLS from bed level. Noted Change in DC plan as family now wanting SNF. Updated DC recs and alerted OT/L about change in POC. Patient will benefit from continued inpatient follow up therapy, <3 hours/day  pt on 4L with VSS however d/t reports of feeling SOB increased O2 to 5L for comfort but able to decrease O2 back to 4L. BP from supine- 142/71 BP EOB- 176/85       If plan is discharge home, recommend the following:  A little help with walking and/or transfers;A lot of help with bathing/dressing/bathroom;Assistance with cooking/housework;Direct supervision/assist for medications management;Direct supervision/assist for financial management;Assist for transportation;Help with stairs or ramp for entrance   Equipment Recommendations  BSC/3in1    Recommendations for Other Services      Precautions / Restrictions Precautions Precautions: Fall Precaution/Restrictions Comments: 4L O2 baseline Restrictions Weight Bearing Restrictions Per Provider Order: No       Mobility Bed Mobility Overal bed mobility: Needs Assistance Bed Mobility: Supine to Sit, Sit to Supine     Supine to sit: Min assist, HOB elevated, Used rails Sit  to supine: Min assist, HOB elevated, Used rails   General bed mobility comments: MIN A needed to elevate trunk and scoot hips to EOB, assist needed to manage BLEs back to bed    Transfers                   General transfer comment: declined standing d/t reports of dizziness     Balance Overall balance assessment: Needs assistance Sitting-balance support: Feet supported, Bilateral upper extremity supported Sitting balance-Leahy Scale: Fair Sitting balance - Comments: close CGA d/t reports of dizziness                                   ADL either performed or assessed with clinical judgement   ADL Overall ADL's : Needs assistance/impaired             Lower Body Bathing: Maximal assistance;Bed level Lower Body Bathing Details (indicate cue type and reason): d/t purewick leaking Upper Body Dressing : Minimal assistance;Bed level Upper Body Dressing Details (indicate cue type and reason): to don new gown         Toileting- Clothing Manipulation and Hygiene: Maximal assistance;Bed level       Functional mobility during ADLs: Minimal assistance (bed mobility only) General ADL Comments: ADL participation impacted by increased anxiety today and reports of dizziness    Extremity/Trunk Assessment Upper Extremity Assessment Upper Extremity Assessment: Generalized weakness   Lower Extremity Assessment Lower Extremity Assessment: Generalized weakness   Cervical / Trunk Assessment Cervical / Trunk Assessment: Kyphotic  Vision Baseline Vision/History: 0 No visual deficits Vision Assessment?: No apparent visual deficits   Perception Perception Perception: Not tested   Praxis Praxis Praxis: Not tested   Communication Communication Communication: No apparent difficulties   Cognition Arousal: Alert Behavior During Therapy: WFL for tasks assessed/performed, Anxious Cognition: No apparent impairments             OT - Cognition Comments:  perseverating on feeling like she cant breathe even though sats WFL                 Following commands: Intact        Cueing   Cueing Techniques: Verbal cues  Exercises      Shoulder Instructions       General Comments pt on 4L with VSS however d/t reports of feeling SOB increased O2 to 5L for comfort but able to decrease O2 back to 4L. BP from supine- 142/71, BP EOB- 176/85    Pertinent Vitals/ Pain       Pain Assessment Pain Assessment: No/denies pain  Home Living                                          Prior Functioning/Environment              Frequency  Min 1X/week        Progress Toward Goals  OT Goals(current goals can now be found in the care plan section)  Progress towards OT goals: Progressing toward goals (gradually)  Acute Rehab OT Goals Patient Stated Goal: none stated Time For Goal Achievement: 06/07/24 Potential to Achieve Goals: Fair  Plan      Co-evaluation                 AM-PAC OT "6 Clicks" Daily Activity     Outcome Measure   Help from another person eating meals?: A Little Help from another person taking care of personal grooming?: A Little Help from another person toileting, which includes using toliet, bedpan, or urinal?: A Lot Help from another person bathing (including washing, rinsing, drying)?: A Lot Help from another person to put on and taking off regular upper body clothing?: A Lot Help from another person to put on and taking off regular lower body clothing?: A Lot 6 Click Score: 14    End of Session Equipment Utilized During Treatment: Oxygen ;Other (comment) (4- 5L)  OT Visit Diagnosis: Unsteadiness on feet (R26.81);Other abnormalities of gait and mobility (R26.89);Muscle weakness (generalized) (M62.81)   Activity Tolerance Patient tolerated treatment well   Patient Left in bed;with call bell/phone within reach;with bed alarm set;with nursing/sitter in room   Nurse Communication  Mobility status;Other (comment) (nurse present at end of session)        Time: 1610-9604 OT Time Calculation (min): 27 min  Charges: OT General Charges $OT Visit: 1 Visit OT Treatments $Self Care/Home Management : 23-37 mins  Curry Dove., COTA/L Acute Rehabilitation Services [phone]   Beth Blankenship 05/28/2024, 1:16 PM

## 2024-05-28 NOTE — Consult Note (Signed)
 [facility] Psychiatric Consult Follow-up  Patient Name: .Beth Blankenship  MRN: [id number]  DOB: 1951/05/16  Consult Order details:  Orders (From admission, onward)     Start     Ordered   05/26/24 1115  IP CONSULT TO PSYCHIATRY       Ordering Provider: Althia Atlas, MD  Provider:  (Not yet assigned)  Question Answer Comment  Location [facility]   Reason for Consult? please eval for Extrapyramidal symptoms, c/o left hand tremors and difficulty walking.      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 female admitted: Presented to the EDfor 05/23/2024  1:20 AM for HFpEF exacerbation. 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 She does not meet criteria for involuntary commitment based on no threat to self or others.  Current outpatient psychotropic medications include Invega  3 mg daily, Seroquel  100 mg nightly, BuSpar  10 mg twice daily and historically she has had a good response to these medications. She endorsed compliance with medications, but that husband was in charge of these.  Please see plan below for detailed recommendations.   Diagnoses:  Active Hospital problems: Principal Problem:   Acute exacerbation of CHF (congestive heart failure) (HCC)    Plan   ## Psychiatric Medication Recommendations:  - Stop Invega  1.5 mg daily for  inefficacy and questionable EPS contribution to her concerning symptoms. - Start Abilify 3.0 mg daily to target paranoia and mood stability. - Continue Seroquel  100 mg nightly. - Continue BuSpar  10 mg twice daily. - We will continue to follow.  ## Medical Decision Making Capacity: Not specifically addressed in this encounter  ## Further Work-up:  - Recommend outpatient neurology follow-up for suspected parkinsonian symptoms and symptomatology suggestive of Dementia with Lewy body - Recommend MRI for further organic workup  - Agree with vitamin B12 repletion - Defer T2DM management at A1c: 8.6% to primary team While pt on Qtc prolonging medications, please monitor & replete K+ to 4 and Mg2+ to 2 -- most recent EKG on 5/30 had QtC of 452 -- Pertinent labwork reviewed earlier this admission includes: B12 316   ## Disposition:-- There are no psychiatric contraindications to discharge at this time  ## Behavioral / Environmental: - No specific recommendations at this time.     ## Safety and Observation Level:  - Based on my clinical evaluation, I estimate the patient to be at minimal risk of self harm in the current setting. - At this time, we recommend  routine. This decision is based on my review of the chart including patient's history and current presentation, interview of the patient, mental status examination, and consideration of suicide risk including evaluating suicidal ideation, plan, intent, suicidal or self-harm behaviors, risk factors, and protective factors. This judgment is based on our ability to directly address suicide risk, implement suicide prevention strategies, and develop a safety plan while  the patient is in the clinical setting. Please contact our team if there is a concern that risk level has changed.  CSSR Risk Category:C-SSRS RISK CATEGORY: No Risk  Suicide Risk Assessment: Patient has following modifiable risk factors for suicide: None Patient has following  non-modifiable or demographic risk factors for suicide: psychiatric hospitalization Patient has the following protective factors against suicide: Access to outpatient mental health care, Supportive family, Frustration tolerance, no history of suicide attempts, and no history of NSSIB  Thank you for this consult request. Recommendations have been communicated to the primary team.  We will continue to follow at this time.   Kaizlee Carlino, MD       History of Present Illness  Relevant Aspects of Hospital Course:  Admitted on 05/23/2024 for heart failure with preserved ejection fraction exacerbation patient was bradycardic and hypertensive in the ED.  Also noted AKI.  Started IV Lasix  40 mg twice daily.  Patient respiratory status improved.  Psychiatry was consulted for evaluation of extrapyramidal symptoms in the setting of dual antipsychotic therapy (Invega  3 mg daily, quetiapine  700 mg nightly).  Patient Report:   On extensive conversation with patient, continues to have "weird" feeling in her chest, which resembles subjective respiratory distress (O2 saturation 96% on monitor).  Patient is very down and depressed today, says that she experiences this at home as well "except at home I have my husband" to help with ambulation.  Patient has a new pill rolling tremor in her left upper extremity that appears unintentional.  When patient's attention was directed to the tremor, she said that she was not doing this on purpose.  On distraction, the tremor resumed.  Patient endorsed a 10 to 15-second period of left upper extremity shaking, causing her to drop a utensil, which resolved.  Patient is distraught that she cannot fully interact with her family, because of her reduced mobility.  Discussed patient's family -- patient and husband have been together for 40+ years, patient has 2 daughters, 1 of whom is a Comptroller at [employer].  Patient is proud of her children.  Is intermittently tearful and states "I am  going to die."  Reassured patient that her vital signs at present were stable on the monitor while validating patient's psychic distress.  Does not currently receive PT at home.  Also notes that "the weird feeling" is related to patient's paranoia about the devil, "I always feel like that" for approximately a year.  Says that she has felt this way since moving into her new house before this hospitalization.  The patient repeatedly said that her antipsychotic medications have not been helpful in staving off this feeling.  When asked if she would be willing to return to her home dose of Invega , patient was amenable, even if this made the episodes of shaking worse.  Reinforced the patient will need neurology follow-up for Parkinson versus Dementia with Lewy Body workup.  Also encouraged home physical therapy in order to regain as much function as possible as well as cognitive behavioral therapy to talk about these feelings.   Psych ROS:  Depression: Positive for the following neurovegetative symptoms over the last 2 weeks: Low mood, difficulty enjoying things she has previously enjoyed, low energy, difficulty concentrating, psychomotor slowing, and passive suicidal ideation "sometimes".  Last endorsed 2 days ago.  Denies history of active suicidal ideation.  Denies plan. Anxiety: Patient endorses general anxiety out of proportion to stressor about many aspects of her life. Mania (lifetime and current): Patient  denies history of 4+ days of elevated and expansive mood including talkativeness, irritability, disinhibition, etc. Psychosis: (lifetime and current): As above, patient endorses new onset visual and auditory hallucinations around the age of 63.  Adamant that previous to that time, patient has never had hallucinations of any sort.  Review of Systems  Psychiatric/Behavioral:  Negative for hallucinations.   All other systems reviewed and are negative.    Psychiatric and Social History  Psychiatric  History:  Information collected from patient  Prev Dx/Sx: Schizoaffective disorder, bipolar type; major depressive disorder, with psychotic features Current Psych Provider:  Home Meds (current): PO Invega  3 mg daily, Seroquel  100 mg nightly, BuSpar  10 mg twice daily Previous Med Trials: Zoloft  150 mg (may be current med, will need to get in touch with husband) Therapy: Previously  Prior Psych Hospitalization: 2-3 times at [facility] Prior Self Harm: Denies Prior Violence: Denies  Family Psych History: Close family member had "a mental breakdown", however denies history of bipolar disorder and schizophrenia in immediate family Family Hx suicide: Denies  Social History:  Developmental Hx: Unremarkable Educational Hx: 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 Hx: None Living Situation: Husband and daughters (2) Access to weapons/lethal means: Denies  Substance History Alcohol: Previously in college Last Drink unknown Number of drinks per day unknown History of alcohol withdrawal seizures denies History of DT's denies Tobacco: Remote Illicit drugs: Denies Prescription drug abuse: Denies Rehab hx: Denies  Exam Findings  Physical Exam:  Vital Signs:  Temp:  [97.4 F (36.3 C)-98.5 F (36.9 C)] 97.8 F (36.6 C) (06/04 0800) Pulse Rate:  [58-84] 60 (06/04 0800) Resp:  [15-19] 15 (06/04 0800) BP: (143-170)/(66-83) 167/74 (06/04 0800) SpO2:  [94 %-97 %] 96 % (06/04 0800) Weight:  [80 kg] 80 kg (06/04 0145) Blood pressure (!) 167/74, pulse 60, temperature 97.8 F (36.6 C), temperature source Oral, resp. rate 15, height 5\' 3"  (1.6 m), weight 80 kg, SpO2 96%. Body mass index is 31.24 kg/m.  Physical Exam Constitutional:      General: She is not in acute distress.    Appearance: She is normal weight.  Pulmonary:     Effort: Pulmonary effort is normal. No tachypnea.  Neurological:     General: No focal deficit present.     Mental Status: She is alert.      Comments: Cogwheeling rigidity noted in bilateral elbows and wrists on physical exam, similar to 4.  Also, new pill-rolling tremor noted in left upper extremity.     Mental Status Exam: General Appearance: Casual, flat faces  Orientation:  Full (Time, Place, and Person)  Memory:  Immediate;   Fair Recent;   Good  Concentration:  Concentration: Fair and Attention Span: Fair  Recall:  Fair  Attention  Good  Eye Contact:  Good  Speech:  Slow and some speech latency  Language:  Good  Volume:  Normal  Mood: Depressed, tearful  Affect:  Flat  Thought Process:  Coherent, Goal Directed, and Linear  Thought Content:  Logical, Hallucinations: Previous, visual, auditory command (HI/SI), and Paranoid Ideation  Suicidal Thoughts:  No  Homicidal Thoughts:  No  Judgement:  Fair  Insight:  Fair  Psychomotor Activity:  EPS, cogwheeling rigidity noted in bilateral elbows and wrists  Akathisia:  No  Fund of Knowledge: Fair      Assets:  Desire for Improvement Housing Social Support  Cognition:  Intact  ADL's:  Impaired  AIMS (if indicated): 1, 0, 0, 3, 1 (6/2)  Other History   These have been pulled in through the EMR, reviewed, and updated if appropriate.  Family History:  The patient's family history includes Cancer in her maternal grandmother; Diabetes in her paternal aunt and paternal uncle; Heart disease in her father and mother; Hypertension in her mother; Stroke in her mother.  Medical History: Past Medical History:  Diagnosis Date   Arthritis    Cancer (HCC)    Diabetes mellitus without complication (HCC)    H/O mastectomy    left side   History of breast reconstruction    Hypertension    Stroke [facility])    mini strokes    Surgical History: Past Surgical History:  Procedure Laterality Date   MASTECTOMY Left 04/29/2015   REDUCTION MAMMAPLASTY Right    SHOULDER SURGERY Right    TUBAL LIGATION       Medications:   Current Facility-Administered Medications:     acetaminophen  (TYLENOL ) tablet 650 mg, 650 mg, Oral, Q6H PRN, Hall, Carole N, DO   amLODipine  (NORVASC ) tablet 10 mg, 10 mg, Oral, Daily, Arne Langdon, MD, 10 mg at 05/27/24 4098   aspirin  EC tablet 81 mg, 81 mg, Oral, Daily, Arne Langdon, MD, 81 mg at 05/27/24 1191   busPIRone  (BUSPAR ) tablet 10 mg, 10 mg, Oral, BID, Arne Langdon, MD, 10 mg at 05/27/24 2212   clopidogrel  (PLAVIX ) tablet 75 mg, 75 mg, Oral, Daily, Arne Langdon, MD, 75 mg at 05/27/24 4782   [COMPLETED] cyanocobalamin (VITAMIN B12) injection 1,000 mcg, 1,000 mcg, Intramuscular, Q1200, 1,000 mcg at 05/27/24 1211 **FOLLOWED BY** cyanocobalamin (VITAMIN B12) tablet 1,000 mcg, 1,000 mcg, Oral, Daily, Althia Atlas, MD   doxazosin  (CARDURA ) tablet 4 mg, 4 mg, Oral, BID, Arne Langdon, MD, 4 mg at 05/27/24 2212   enoxaparin  (LOVENOX ) injection 40 mg, 40 mg, Subcutaneous, Daily, Hall, Carole N, DO, 40 mg at 05/27/24 9562   furosemide  (LASIX ) tablet 20 mg, 20 mg, Oral, Daily, Althia Atlas, MD, 20 mg at 05/27/24 0949   hydrALAZINE  (APRESOLINE ) injection 10 mg, 10 mg, Intravenous, Q6H PRN, 10 mg at 05/25/24 1644 **OR** hydrALAZINE  (APRESOLINE ) tablet 50 mg, 50 mg, Oral, Q6H PRN, Althia Atlas, MD, 50 mg at 05/24/24 1109   hydrOXYzine  (ATARAX ) tablet 10 mg, 10 mg, Oral, Daily PRN, Arne Langdon, MD, 10 mg at 05/27/24 1705   insulin  aspart (novoLOG ) injection 0-5 Units, 0-5 Units, Subcutaneous, QHS, Hall, Carole N, DO, 2 Units at 05/26/24 2210   insulin  aspart (novoLOG ) injection 0-9 Units, 0-9 Units, Subcutaneous, TID WC, Reesa Cannon N, DO, 2 Units at 05/28/24 1308   ipratropium-albuterol  (DUONEB) 0.5-2.5 (3) MG/3ML nebulizer solution 3 mL, 3 mL, Nebulization, Q6H PRN, Arne Langdon, MD, 3 mL at 05/24/24 1836   isosorbide mononitrate (IMDUR) 24 hr tablet 60 mg, 60 mg, Oral, QPM, Althia Atlas, MD, 60 mg at 05/27/24 1705   melatonin tablet 5 mg, 5 mg, Oral, QHS PRN, [place], Carole N, DO   metoprolol  tartrate (LOPRESSOR ) tablet 100 mg, 100 mg,  Oral, BID, Althia Atlas, MD, 100 mg at 05/27/24 2212   ondansetron  (ZOFRAN -ODT) disintegrating tablet 4 mg, 4 mg, Oral, Q8H PRN, Arne Langdon, MD   paliperidone  (INVEGA ) 24 hr tablet 1.5 mg, 1.5 mg, Oral, Daily, Gwyndolyn Lerner, MD   polyethylene glycol (MIRALAX  / GLYCOLAX ) packet 17 g, 17 g, Oral, Daily PRN, Hall, Carole N, DO   prochlorperazine  (COMPAZINE ) injection 5 mg, 5 mg, Intravenous, Q6H PRN, Hall, Carole N, DO   QUEtiapine  (SEROQUEL ) tablet 100 mg, 100 mg, Oral, QHS, Arne Langdon,  MD, 100 mg at 05/27/24 2211   thiamine (VITAMIN B1) tablet 100 mg, 100 mg, Oral, Daily, Althia Atlas, MD, 100 mg at 05/27/24 6295   Vitamin D (Ergocalciferol) (DRISDOL) 1.25 MG (50000 UNIT) capsule 50,000 Units, 50,000 Units, Oral, Q7 days, Althia Atlas, MD, 50,000 Units at 05/25/24 1138  Allergies: Allergies  Allergen Reactions   Grass Pollen(K-O-R-T-Swt Vern)    Latex Hives   Tetracyclines & Related Hives    Janmichael Giraud, MD

## 2024-05-28 NOTE — Progress Notes (Signed)
 Patient refused bipap for the night. Bipap in room on standby.

## 2024-05-28 NOTE — Plan of Care (Signed)
   Problem: Fluid Volume: Goal: Ability to maintain a balanced intake and output will improve Outcome: Progressing   Problem: Health Behavior/Discharge Planning: Goal: Ability to manage health-related needs will improve Outcome: Progressing   Problem: Metabolic: Goal: Ability to maintain appropriate glucose levels will improve Outcome: Progressing

## 2024-05-28 NOTE — TOC Progression Note (Addendum)
 Transition of Care [facility]) - Progression Note    Patient Details  Name: Beth Blankenship MRN: [id number] Date of Birth: [birth year]  Transition of [facility]) CM/SW Contact  Arron Big, [place] Phone Number: 05/28/2024, 11:11 AM  Clinical Narrative:   CSW followed up with patient about SNF bed choice. Patient states family is touring facilities today. CSW will follow up at a later time for decision.   2:36 PM CSW called patients daughter, Shelvy Dickens, to see if family made a decision on SNF. Family is on the way to tour [facility] at this time. CSW will follow up.   TOC will continue to follow.    Expected Discharge Plan: Skilled Nursing Facility Barriers to Discharge: Continued Medical Work up, SNF Pending bed offer, Insurance Authorization  Expected Discharge Plan and Services In-house Referral: Clinical Social Work     Living arrangements for the past 2 months: Single Family Home                                       Social Determinants of Health (SDOH) Interventions SDOH Screenings   Food Insecurity: No Food Insecurity (05/23/2024)  Housing: Unknown (05/23/2024)  Recent Concern: Housing - High Risk (05/16/2024)   Received from [facility]  Transportation Needs: No Transportation Needs (05/23/2024)  Utilities: Not At Risk (05/23/2024)  Depression (PHQ2-9): High Risk (10/31/2022)  Financial Resource Strain: Medium Risk (05/16/2024)   Received from [facility]  Physical Activity: Unknown (08/09/2022)   Received from [facility]  Recent Concern: Physical Activity - Inactive (08/09/2022)   [facility] from [facility]  Social Connections: Unknown (05/23/2024)  Stress: No Stress Concern Present (08/19/2023)   Received from [facility]  Tobacco Use: Medium Risk (05/16/2024)   Received from [facility]    Readmission Risk Interventions     No data to display

## 2024-05-28 NOTE — Progress Notes (Signed)
 Physical Therapy Treatment Patient Details Name: Beth Blankenship MRN: [id number] DOB: Nov 27, 1951 Today's Date: 05/28/2024   History of Present Illness Pt is a 73 y/o F presenting to ED on 5/30 wtih SOB, admitted for acute CHF exacerbation. PMH includes HTN, CVA, breast CA p/w SOB, elevated BNP    PT Comments  Pt shows improvement with SOB, but mobility continues to be limited by L LE/ankle contracture in plantarflexion.  Emphasis on strengthening exercise, prolonged stretching L ankle into df and standing transfer to the chair.     If plan is discharge home, recommend the following: A lot of help with walking and/or transfers;A little help with bathing/dressing/bathroom;Assistance with cooking/housework;Assist for transportation   Can travel by private vehicle        Equipment Recommendations  Other (comment) (TBD)    Recommendations for Other Services       Precautions / Restrictions Precautions Precautions: Fall Recall of Precautions/Restrictions: Intact Precaution/Restrictions Comments: 4L O2 baseline     Mobility  Bed Mobility Overal bed mobility: Needs Assistance Bed Mobility: Supine to Sit     Supine to sit: Min assist, Used rails (lowered HOB)     General bed mobility comments: assist for trunk elevation,  pt able to scoot to EOB    Transfers Overall transfer level: Needs assistance   Transfers: Sit to/from Stand Sit to Stand: Min assist   Step pivot transfers: Mod assist       General transfer comment: extra assist for stability with transfer due to L heel unable to approximate to the floor and pt's L knee extends and foot slides if not managed.  The overall transfer is characterized with a quick uncontrolled side step to the chair.    Ambulation/Gait                   Stairs             Wheelchair Mobility     Tilt Bed    Modified Rankin (Stroke Patients Only)       Balance Overall balance assessment: Needs  assistance Sitting-balance support: Feet supported, Bilateral upper extremity supported Sitting balance-Leahy Scale: Fair     Standing balance support: During functional activity, Reliant on assistive device for balance Standing balance-Leahy Scale: Poor                              Communication Communication Communication: No apparent difficulties  Cognition Arousal: Alert Behavior During Therapy: WFL for tasks assessed/performed, Anxious   PT - Cognitive impairments: No apparent impairments                         Following commands: Intact      Cueing Cueing Techniques: Verbal cues  Exercises Other Exercises Other Exercises: hip/knee flex/ext bil x 10 reps with graded resistance into ext. Other Exercises: bicep/tricep presses x10 reps bil with graded resistance.    General Comments General comments (skin integrity, edema, etc.): On 4L VSS      Pertinent Vitals/Pain Pain Assessment Pain Assessment: No/denies pain    Home Living                          Prior Function            PT Goals (current goals can now be found in the care plan section) Acute Rehab PT Goals PT Goal Formulation:  With patient Time For Goal Achievement: 06/06/24 Potential to Achieve Goals: Fair Progress towards PT goals: Progressing toward goals    Frequency    Min 3X/week      PT Plan      Co-evaluation              AM-PAC PT "6 Clicks" Mobility   Outcome Measure  Help needed turning from your back to your side while in a flat bed without using bedrails?: None Help needed moving from lying on your back to sitting on the side of a flat bed without using bedrails?: None Help needed moving to and from a bed to a chair (including a wheelchair)?: A Little Help needed standing up from a chair using your arms (e.g., wheelchair or bedside chair)?: A Lot Help needed to walk in hospital room?: Total Help needed climbing 3-5 steps with a railing?  : Total 6 Click Score: 15    End of Session Equipment Utilized During Treatment: Oxygen  Activity Tolerance: Patient tolerated treatment well Patient left: in chair;with call bell/phone within reach Nurse Communication: Mobility status PT Visit Diagnosis: Muscle weakness (generalized) (M62.81);Difficulty in walking, not elsewhere classified (R26.2);Other symptoms and signs involving the nervous system (R29.898)     Time: 1436-1500 PT Time Calculation (min) (ACUTE ONLY): 24 min  Charges:    $Therapeutic Exercise: 8-22 mins $Therapeutic Activity: 8-22 mins PT General Charges $$ ACUTE PT VISIT: 1 Visit                     05/28/2024  Nohemi Batters., PT Acute Rehabilitation Services [phone]  (office)   Durell Gilding Lusine Corlett 05/28/2024, 3:15 PM

## 2024-05-29 ENCOUNTER — Encounter (HOSPITAL_COMMUNITY): Payer: Self-pay | Admitting: Internal Medicine

## 2024-05-29 DIAGNOSIS — F0390 Unspecified dementia without behavioral disturbance: Secondary | ICD-10-CM | POA: Diagnosis not present

## 2024-05-29 DIAGNOSIS — I5033 Acute on chronic diastolic (congestive) heart failure: Secondary | ICD-10-CM | POA: Diagnosis not present

## 2024-05-29 LAB — BASIC METABOLIC PANEL WITH GFR
Anion gap: 10 (ref 5–15)
BUN: 20 mg/dL (ref 8–23)
CO2: 23 mmol/L (ref 22–32)
Calcium: 8.7 mg/dL — ABNORMAL LOW (ref 8.9–10.3)
Chloride: 102 mmol/L (ref 98–111)
Creatinine, Ser: 1.03 mg/dL — ABNORMAL HIGH (ref 0.44–1.00)
GFR, Estimated: 58 mL/min — ABNORMAL LOW (ref >60)
Glucose, Bld: 176 mg/dL — ABNORMAL HIGH (ref 70–99)
Potassium: 3.6 mmol/L (ref 3.5–5.1)
Sodium: 135 mmol/L (ref 135–145)

## 2024-05-29 LAB — CBC
HCT: 34.8 % — ABNORMAL LOW (ref 36.0–46.0)
Hemoglobin: 10.9 g/dL — ABNORMAL LOW (ref 12.0–15.0)
MCH: 27.3 pg (ref 26.0–34.0)
MCHC: 31.3 g/dL (ref 30.0–36.0)
MCV: 87.2 fL (ref 80.0–100.0)
Platelets: 236 10*3/uL (ref 150–400)
RBC: 3.99 MIL/uL (ref 3.87–5.11)
RDW: 13.6 % (ref 11.5–15.5)
WBC: 5.5 10*3/uL (ref 4.0–10.5)
nRBC: 0 % (ref 0.0–0.2)

## 2024-05-29 LAB — GLUCOSE, CAPILLARY
Glucose-Capillary: 151 mg/dL — ABNORMAL HIGH (ref 70–99)
Glucose-Capillary: 164 mg/dL — ABNORMAL HIGH (ref 70–99)
Glucose-Capillary: 155 mg/dL — ABNORMAL HIGH (ref 70–99)
Glucose-Capillary: 281 mg/dL — ABNORMAL HIGH (ref 70–99)

## 2024-05-29 MED ORDER — SODIUM CHLORIDE 0.9% FLUSH
10.0000 mL | Freq: Two times a day (BID) | INTRAVENOUS | Status: DC
  Administered 2024-05-29 – 2024-05-30 (×2): 10 mL

## 2024-05-29 MED ORDER — CALCIUM CARBONATE ANTACID 500 MG PO CHEW
1.0000 | CHEWABLE_TABLET | Freq: Three times a day (TID) | ORAL | Status: DC
  Administered 2024-05-29 – 2024-05-30 (×5): 200 mg via ORAL
  Filled 2024-05-29 (×5): qty 1

## 2024-05-29 MED ORDER — SODIUM CHLORIDE 0.9% FLUSH: 10.0000 mL | INTRAVENOUS | Status: DC | PRN

## 2024-05-29 NOTE — Progress Notes (Signed)
 PROGRESS NOTE    Beth Blankenship  UEA:[id number] DOB: 1951-11-01 DOA: 05/23/2024 PCP: Patient, No Pcp Per   Brief Narrative:  Beth Blankenship is a 73 y.o. female with medical history significant of HTN, CVA, and breast cancer p/w SOB, and elevated BNP c/w HFpEF exacerbation.   Pt was in her USOH until 1600 yesterday she experienced SOB while watching television. She turned up her home O2 from 4L [place] to 5L [place] and had mild to no improvement. She continued throughout the day with SOB that worsened with mild activity like getting up to go to the restroom. She became increasingly concerned when she went to bed at 2300 and was unable to lay flat; in fact, she felt like she was "drowning" and activated EMS. Of note, the patient reports eating out multiple times a week and does not adhere to a low salt diet.   In the ED, pt bradycardic and hypertensive. Labs notable for Cr 1.36 (baseline 0.8) and BNP 916. CXR with b/l pulmonary edema. Pt admitted to medicine for diuresis.  Assessment & Plan:   Principal Problem:   Acute exacerbation of CHF (congestive heart failure) (HCC)  Acute hypoxic respiratory failure secondary to congestive heart failure with preserved ejection fraction, POA: -PTA metoprolol  100mg  BID and ASA 81mg  daily TTE LVEF 60-65%, moderately decreased RV systolic function, severe PAH 6/1 Lasix  40 mg a.m. dose given, held Lasix  on 05/26/2024 due to elevated creatinine 6/3 sCr 1.23 improved, started Lasix  20 mg p.o. daily.  Patient's creatinine was back to baseline on 05/28/2024 so IV Lasix  40 was started.  No crackles today as opposed to yesterday but surprisingly she is stating that she is not feeling better than yesterday.  Although she is down to 3 L of oxygen  as opposed to 4 L yesterday.  Nurse advised to wean down as much as we can.  Incentive spirometry encouraged.  Urine output only shows 900 cc but there is a lot of unmeasured urine.  Will continue current management.  Strict I's  and O's, daily weight and low-sodium diet.   # AKI on CKD stage II rule out.  AKI ruled in. Baseline sCr 0.70.  Presented with creatinine of 1.26 which peaked at 1.44.  Improving and down to 1.05.  Will need to avoid all nephrotoxic agents but needs challenge with the IV Lasix  due to CHF.  # Severe pulmonary hypertension  RVSP 86.5 mmHg; presumably related to volume overload from above -Diuresis per above - Recommended to follow with pulmonologist as an outpatient for PFTs to exclude COPD vs sleep study to rule out OSA   H/o HTN - Blood pressure fairly controlled continue PTA amlodipine    H/o DM2 -SSI TID AC prn    H/o CVA -PTA aspirin  and plavix   Hyperlipidemia: After reviewing entire chart diligently, I found that patient was tested for lipid panel on 05/24/2024 which indicates triglycerides of 176 and LDL of 138.  Patient not on any statins.  Starting on atorvastatin  40 mg p.o. daily.   H/o Subarachnoid hemorrhage and subdural hematoma s/p fall on 5/1 5/1 CT Head: 1. Small volume of right frontal convexity subarachnoid hemorrhage is mildly increased.  2. No substantial change in size of a right cerebral convexity subdural hematoma which measures up to approximately 5 mm in thickness. No midline shift.  5/31 CT Head: 1. Interval decrease in size of right frontoparietal convexity subdural hemorrhage, now measuring up to 3 mm. 2. Interval resolution of the previously seen subarachnoid hemorrhage  along the right frontal lobe. 3. Stable background of chronic small vessel disease with old infarcts in the superior right cerebellar hemisphere and medial left occipital lobe.     H/o unspecified schizophrenia -PTA Invega  3mg  daily, buspar  and seroquel  # C/o tremors in the left hand and does not feel herself, weird feelings and lightheadedness. 6/1 Started thiamine 100 mg p.o. daily for 30 days EEG: Negative for seizures, generalized encephalopathy.  6/2 psych consulted for possible  parkinsonian symptoms.  Psych decreased dose of Invega  from 3 to 1.5 mg p.o. daily.  Recommended neurology follow-up as an outpatient for Lewy body dementia workup.  Patient may need MRI brain but patient refusing MRI brain.  On 05/28/2021 regular, psychiatry noted some new pill-rolling tremor of left upper extremity today. Cogwheeling rigidity still present. Patient is also more anxious and tearful today.  Psychiatry switched her from invega  1.5 mg qday to Abilify 3.0 mg (typically less severe EPS profile, patient also thinks the invega  has been ineffective).  Psychiatry following.  Appreciate their help.   # Vitamin D deficiency: started vitamin D 50,000 units p.o. weekly, follow with PCP to repeat vitamin D level after 3 to 6 months.   # Vitamin B12 level 316, goal >400: Started vitamin B12 1000 mcg IM injection daily x 3  days, followed by oral supplement.  Follow-up PCP to repeat vitamin B12 level after 3 to 6 months.  DVT prophylaxis: enoxaparin  (LOVENOX ) injection 40 mg Start: 05/23/24 1000   Code Status: Full Code  Family Communication: Husband present at bedside.  Plan of care discussed with patient in length and he/she verbalized understanding and agreed with it.  Status is: Inpatient Remains inpatient appropriate because: Needs IV diuresis, still hypoxic.   Estimated body mass index is 32.34 kg/m as calculated from the following:   Height as of this encounter: 5\' 3"  (1.6 m).   Weight as of this encounter: 82.8 kg.    Nutritional Assessment: Body mass index is 32.34 kg/m.Aaron Aas Seen by dietician.  I agree with the assessment and plan as outlined below: Nutrition Status:        . Skin Assessment: I have examined the patient's skin and I agree with the wound assessment as performed by the wound care RN as outlined below:    Consultants:  Psychiatry  Procedures:  None  Antimicrobials:  Anti-infectives (From admission, onward)    None         Subjective: And examined,  husband at the bedside.  Patient still complains of not feeling well and still complains of shortness of breath.  No other complaint.  Objective: Vitals:   05/29/24 0146 05/29/24 0516 05/29/24 0800 05/29/24 0817  BP: (!) 154/72 (!) 161/64  (!) 157/70  Pulse: 60 61  63  Resp: 17 17 19  (!) 25  Temp: 98.7 F (37.1 C) 98 F (36.7 C)  98.6 F (37 C)  TempSrc: Oral Oral  Oral  SpO2: 97% 96%  97%  Weight:  82.8 kg    Height:        Intake/Output Summary (Last 24 hours) at 05/29/2024 0820 Last data filed at 05/29/2024 0300 Gross per 24 hour  Intake 480 ml  Output 900 ml  Net -420 ml   Filed Weights   05/27/24 0040 05/28/24 0145 05/29/24 0516  Weight: 81 kg 80 kg 82.8 kg    Examination:  General exam: Appears calm and comfortable  Respiratory system: Clear to auscultation. Respiratory effort normal. Cardiovascular system: S1 & S2 heard,  RRR. No JVD, murmurs, rubs, gallops or clicks. No pedal edema. Gastrointestinal system: Abdomen is nondistended, soft and nontender. No organomegaly or masses felt. Normal bowel sounds heard. Central nervous system: Alert and oriented. No focal neurological deficits. Extremities: Symmetric 5 x 5 power. Skin: No rashes, lesions or ulcers.    Data Reviewed: I have personally reviewed following labs and imaging studies  CBC: Recent Labs  Lab 05/24/24 0920 05/25/24 0319 05/26/24 0302 05/27/24 0236 05/28/24 0236  WBC 7.4 6.9 8.0 7.0 5.7  HGB 11.7* 10.7* 10.4* 11.0* 10.4*  HCT 37.0 34.3* 33.1* 35.3* 33.6*  MCV 88.9 87.9 88.5 89.6 89.8  PLT 229 205 208 206 196   Basic Metabolic Panel: Recent Labs  Lab 05/23/24 0430 05/24/24 0920 05/25/24 0319 05/25/24 1010 05/26/24 0302 05/27/24 0236 05/28/24 0236  NA 139 142 139  --  136 140 138  K 3.9 3.8 3.1*  --  3.8 3.8 4.0  CL 109 103 103  --  102 105 107  CO2 21* 27 28  --  27 26 25   GLUCOSE 262* 259* 166*  --  186* 162* 169*  BUN 21 15 20   --  24* 24* 25*  CREATININE 1.36* 1.12* 1.30*  --   1.44* 1.23* 1.05*  CALCIUM  8.3* 8.8* 8.7*  --  8.4* 8.8* 8.7*  MG 1.8 1.8  --  1.8 1.8  --   --   PHOS 4.0 3.5  --  4.7* 3.7  --   --    GFR: Estimated Creatinine Clearance: 49.4 mL/min (A) (by C-G formula based on SCr of 1.05 mg/dL (H)). Liver Function Tests: No results for input(s): "AST", "ALT", "ALKPHOS", "BILITOT", "PROT", "ALBUMIN" in the last 168 hours. No results for input(s): "LIPASE", "AMYLASE" in the last 168 hours. No results for input(s): "AMMONIA" in the last 168 hours. Coagulation Profile: No results for input(s): "INR", "PROTIME" in the last 168 hours. Cardiac Enzymes: No results for input(s): "CKTOTAL", "CKMB", "CKMBINDEX", "TROPONINI" in the last 168 hours. BNP (last 3 results) No results for input(s): "PROBNP" in the last 8760 hours. HbA1C: No results for input(s): "HGBA1C" in the last 72 hours. CBG: Recent Labs  Lab 05/28/24 0636 05/28/24 1123 05/28/24 1528 05/28/24 2056 05/29/24 0602  GLUCAP 192* 212* 195* 261* 151*   Lipid Profile: No results for input(s): "CHOL", "HDL", "LDLCALC", "TRIG", "CHOLHDL", "LDLDIRECT" in the last 72 hours. Thyroid  Function Tests: No results for input(s): "TSH", "T4TOTAL", "FREET4", "T3FREE", "THYROIDAB" in the last 72 hours. Anemia Panel: No results for input(s): "VITAMINB12", "FOLATE", "FERRITIN", "TIBC", "IRON", "RETICCTPCT" in the last 72 hours. Sepsis Labs: No results for input(s): "PROCALCITON", "LATICACIDVEN" in the last 168 hours.  No results found for this or any previous visit (from the past 240 hours).   Radiology Studies: No results found.  Scheduled Meds:  amLODipine   10 mg Oral Daily   ARIPiprazole  3 mg Oral Daily   aspirin  EC  81 mg Oral Daily   atorvastatin   40 mg Oral Daily   busPIRone   10 mg Oral BID   calcium  carbonate  1 tablet Oral TID WC   clopidogrel   75 mg Oral Daily   vitamin B-12  1,000 mcg Oral Daily   doxazosin   4 mg Oral BID   enoxaparin  (LOVENOX ) injection  40 mg Subcutaneous Daily    insulin  aspart  0-5 Units Subcutaneous QHS   insulin  aspart  0-9 Units Subcutaneous TID WC   isosorbide mononitrate  60 mg Oral QPM   metoprolol  tartrate  100 mg Oral BID   QUEtiapine   100 mg Oral QHS   thiamine  100 mg Oral Daily   Vitamin D (Ergocalciferol)  50,000 Units Oral Q7 days   Continuous Infusions:   LOS: 6 days   Modena Andes, MD [facility]  05/29/2024, 8:20 AM   *Please note that this is a verbal dictation therefore any spelling or grammatical errors are due to the "Dragon [employer] One" system interpretation.  Please page via Amion and do not message via secure chat for urgent patient care matters. Secure chat can be used for non urgent patient care matters.  How to contact the [facility] Attending or Consulting provider 7A - 7P or covering provider during after hours 7P -7A, for this patient?  Check the care team in [facility] and look for a) attending/consulting [facility] provider listed and b) the [facility] team listed. Page or secure chat 7A-7P. Log into [email or web address] and use [facility] universal password to access. If you do not have the password, please contact the hospital operator. Locate the [facility] provider you are looking for under [facility] and page to a number that you can be directly reached. If you still have difficulty reaching the provider, please page the [facility] (Director on Call) for the Hospitalists listed on amion for assistance.

## 2024-05-29 NOTE — Plan of Care (Signed)
  Problem: Education: Goal: Ability to describe self-care measures that may prevent or decrease complications (Diabetes Survival Skills Education) will improve Outcome: Progressing   Problem: Coping: Goal: Ability to adjust to condition or change in health will improve Outcome: Progressing   Problem: Fluid Volume: Goal: Ability to maintain a balanced intake and output will improve Outcome: Progressing   Problem: Skin Integrity: Goal: Risk for impaired skin integrity will decrease Outcome: Progressing   Problem: Nutritional: Goal: Maintenance of adequate nutrition will improve Outcome: Progressing

## 2024-05-29 NOTE — Progress Notes (Signed)

## 2024-05-29 NOTE — TOC Progression Note (Addendum)
 Transition of Care [facility]) - Progression Note    Patient Details  Name: Beth Blankenship MRN: [id number] Date of Birth: October 14, 1951  [facility]) CM/SW Contact  Arron Big, [place] Phone Number: 05/29/2024, 10:44 AM  Clinical Narrative:   CSW met patient and spouse at bedside for SNF decision. Per patients spouse, his dtr will tour [place] today and then family will have a decision - CSW will follow up. Denman Fischer (spouse) stated his current number is [phone], CSW updated patients chart with correct number. Old number is [phone] and when called the automated voice states the number is disconnected.  3:12 PM CSW spoke with patients daughter about SNF decision. Family has not decided a this time but will call CSW back when they do decide.   TOC will continue to follow.    Expected Discharge Plan: Skilled Nursing Facility Barriers to Discharge: Continued Medical Work up, SNF Pending bed offer, Insurance Authorization  Expected Discharge Plan and Services In-house Referral: Clinical Social Work     Living arrangements for the past 2 months: Single Family Home                                       Social Determinants of Health (SDOH) Interventions SDOH Screenings   Food Insecurity: No Food Insecurity (05/23/2024)  Housing: Unknown (05/23/2024)  Recent Concern: Housing - High Risk (05/16/2024)   Received from [facility]  Transportation Needs: No Transportation Needs (05/23/2024)  Utilities: Not At Risk (05/23/2024)  Depression (PHQ2-9): High Risk (10/31/2022)  Financial Resource Strain: Medium Risk (05/16/2024)   Received from [facility]  Physical Activity: Unknown (08/09/2022)   Received from [facility]  Recent Concern: Physical Activity - Inactive (08/09/2022)   Received from [facility]  Social Connections: Unknown (05/23/2024)  Stress: No Stress Concern Present (08/19/2023)   Received from [facility]  Tobacco Use: Medium  Risk (05/16/2024)   Received from [facility]    Readmission Risk Interventions     No data to display

## 2024-05-29 NOTE — Consult Note (Signed)
 [facility] Psychiatric Consult Follow-up  Patient Name: .Beth Blankenship  MRN: [id number]  DOB: 1951-03-14  Consult Order details:  Orders (From admission, onward)     Start     Ordered   05/26/24 1115  IP CONSULT TO PSYCHIATRY       Ordering Provider: Althia Atlas, MD  Provider:  (Not yet assigned)  Question Answer Comment  Location [facility]   Reason for Consult? please eval for Extrapyramidal symptoms, c/o left hand tremors and difficulty walking.      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 female admitted: Presented to the EDfor 05/23/2024  1:20 AM for HFpEF exacerbation. 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 She does not meet criteria for involuntary commitment based on no threat to self or others.  Current outpatient psychotropic medications include Invega  3 mg daily, Seroquel  100 mg nightly, BuSpar  10 mg twice daily and historically she has had a good response to these medications. She endorsed compliance with medications, but that husband was in charge of these.  Please see plan below for detailed recommendations.   Diagnoses:  Active Hospital problems: Principal Problem:   Acute exacerbation of CHF (congestive heart failure) (HCC) Active Problems:   Psychosis in elderly [facility])    Plan   ## Psychiatric Medication  Recommendations:  - Stop Invega  1.5 mg daily for inefficacy and questionable EPS contribution to her concerning symptoms. - Start Abilify 3.0 mg daily to target paranoia and mood stability.  This begins on 05/29/2024.  We will monitor progress. - Continue Seroquel  100 mg nightly. - Continue BuSpar  10 mg twice daily. - We will continue to follow.  ## Medical Decision Making Capacity: Not specifically addressed in this encounter  ## Further Work-up:  - Recommend outpatient neurology follow-up for suspected parkinsonian symptoms and symptomatology suggestive of Dementia with Lewy body - Recommend MRI for further organic workup  - Agree with vitamin B12 repletion - Defer T2DM management at A1c: 8.6% to primary team While pt on Qtc prolonging medications, please monitor & replete K+ to 4 and Mg2+ to 2 -- most recent EKG on 5/30 had QtC of 452 -- Pertinent labwork reviewed earlier this admission includes: B12 316   ## Disposition:-- There are no psychiatric contraindications to discharge at this time  ## Behavioral / Environmental: - No specific recommendations at this time.     ## Safety and Observation Level:  - Based on my clinical evaluation, I estimate the patient to be at minimal risk of self harm in the current setting. - At this time, we recommend  routine. This decision is based on my review of the chart including patient's history and current presentation, interview of the patient, mental status examination, and consideration of suicide risk including evaluating suicidal ideation, plan, intent, suicidal or self-harm behaviors, risk factors, and protective factors. This judgment is based  on our ability to directly address suicide risk, implement suicide prevention strategies, and develop a safety plan while the patient is in the clinical setting. Please contact our team if there is a concern that risk level has changed.  CSSR Risk Category:C-SSRS RISK CATEGORY: No Risk  Suicide Risk  Assessment: Patient has following modifiable risk factors for suicide: None Patient has following non-modifiable or demographic risk factors for suicide: psychiatric hospitalization Patient has the following protective factors against suicide: Access to outpatient mental health care, Supportive family, Frustration tolerance, no history of suicide attempts, and no history of NSSIB  Thank you for this consult request. Recommendations have been communicated to the primary team.  We will continue to follow at this time.   Buzz Cass, MD       History of Present Illness  Relevant Aspects of Hospital Course:  Admitted on 05/23/2024 for heart failure with preserved ejection fraction exacerbation patient was bradycardic and hypertensive in the ED.  Also noted AKI.  Started IV Lasix  40 mg twice daily.  Patient respiratory status improved.  Psychiatry was consulted for evaluation of extrapyramidal symptoms in the setting of dual antipsychotic therapy (Invega  3 mg daily, quetiapine  700 mg nightly).  Patient Report:  05/28/2024: On extensive conversation with patient, continues to have "weird" feeling in her chest, which resembles subjective respiratory distress (O2 saturation 96% on monitor).  Patient is very down and depressed today, says that she experiences this at home as well "except at home I have my husband" to help with ambulation.  Patient has a new pill rolling tremor in her left upper extremity that appears unintentional.  When patient's attention was directed to the tremor, she said that she was not doing this on purpose.  On distraction, the tremor resumed.  Patient endorsed a 10 to 15-second period of left upper extremity shaking, causing her to drop a utensil, which resolved.  Patient is distraught that she cannot fully interact with her family, because of her reduced mobility.  Discussed patient's family -- patient and husband have been together for 40+ years, patient has 2 daughters, 1 of  whom is a Comptroller at [employer].  Patient is proud of her children.  Is intermittently tearful and states "I am going to die."  Reassured patient that her vital signs at present were stable on the monitor while validating patient's psychic distress.  Does not currently receive PT at home.  Also notes that "the weird feeling" is related to patient's paranoia about the devil, "I always feel like that" for approximately a year.  Says that she has felt this way since moving into her new house before this hospitalization.  The patient repeatedly said that her antipsychotic medications have not been helpful in staving off this feeling.  When asked if she would be willing to return to her home dose of Invega , patient was amenable, even if this made the episodes of shaking worse.  Reinforced the patient will need neurology follow-up for Parkinson versus Dementia with Lewy Body workup.  Also encouraged home physical therapy in order to regain as much function as possible as well as cognitive behavioral therapy to talk about these feelings.   05/29/2024: The patient was seen today and the chart was reviewed.  The husband was present at the bedside.  The patient continues to endorse" feeling weird" and anxiety.  She continues to endorse the fear that the devil was out to get her.  Husband reports that her symptoms started about a year  ago after she was diagnosed with CHF.  He reports that she had depression with some psychotic symptoms.  Today she denies any auditory or visual hallucinations But does endorse paranoia that something was out to get her.  Her medications were switched to Abilify 3 mg a day with the option to go higher as tolerated.  Patient would have received her first dose today.  We will continue to evaluate progress.   Psych ROS:  Depression: Positive for the following neurovegetative symptoms over the last 2 weeks: Low mood, difficulty enjoying things she has previously enjoyed, low energy, difficulty  concentrating, psychomotor slowing, and passive suicidal ideation "sometimes".  Last endorsed 2 days ago.  Denies history of active suicidal ideation.  Denies plan. Anxiety: Patient endorses general anxiety out of proportion to stressor about many aspects of her life. Mania (lifetime and current): Patient denies history of 4+ days of elevated and expansive mood including talkativeness, irritability, disinhibition, etc. Psychosis: (lifetime and current): As above, patient endorses new onset visual and auditory hallucinations around the age of 21.  Adamant that previous to that time, patient has never had hallucinations of any sort.  Review of Systems  Psychiatric/Behavioral:  Negative for hallucinations.   All other systems reviewed and are negative.    Psychiatric and Social History  Psychiatric History:  Information collected from patient  Prev Dx/Sx: Schizoaffective disorder, bipolar type; major depressive disorder, with psychotic features Current Psych Provider:  Home Meds (current): PO Invega  3 mg daily, Seroquel  100 mg nightly, BuSpar  10 mg twice daily Previous Med Trials: Zoloft  150 mg (may be current med, will need to get in touch with husband) Therapy: Previously  Prior Psych Hospitalization: 2-3 times at [facility] Prior Self Harm: Denies Prior Violence: Denies  Family Psych History: Close family member had "a mental breakdown", however denies history of bipolar disorder and schizophrenia in immediate family Family Hx suicide: Denies  Social History:  Developmental Hx: Unremarkable Educational Hx: 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 Hx: None Living Situation: Husband and daughters (2) Access to weapons/lethal means: Denies  Substance History Alcohol: Previously in college Last Drink unknown Number of drinks per day unknown History of alcohol withdrawal seizures denies History of DT's denies Tobacco: Remote Illicit drugs: Denies Prescription  drug abuse: Denies Rehab hx: Denies  Exam Findings  Physical Exam:  Vital Signs:  Temp:  [97.7 F (36.5 C)-98.8 F (37.1 C)] 98.6 F (37 C) (06/05 0817) Pulse Rate:  [57-70] 60 (06/05 1000) Resp:  [14-25] 14 (06/05 1000) BP: (141-172)/(64-80) 157/70 (06/05 0817) SpO2:  [94 %-98 %] 94 % (06/05 1000) Weight:  [82.8 kg] 82.8 kg (06/05 0516) Blood pressure (!) 157/70, pulse 60, temperature 98.6 F (37 C), temperature source Oral, resp. rate 14, height 5\' 3"  (1.6 m), weight 82.8 kg, SpO2 94%. Body mass index is 32.34 kg/m.  Physical Exam Constitutional:      General: She is not in acute distress.    Appearance: She is normal weight.  Pulmonary:     Effort: Pulmonary effort is normal. No tachypnea.  Neurological:     General: No focal deficit present.     Mental Status: She is alert.     Comments: Cogwheeling rigidity noted in bilateral elbows and wrists on physical exam, similar to 4.  Also, new pill-rolling tremor noted in left upper extremity.     Mental Status Exam: General Appearance: Casual, flat faces  Orientation:  Full (Time, Place, and Person)  Memory:  Immediate;  Fair Recent;   Good  Concentration:  Concentration: Fair and Attention Span: Fair  Recall:  Fair  Attention  Good  Eye Contact:  Good  Speech:  Slow and some speech latency  Language:  Good  Volume:  Normal  Mood: Depressed, tearful  Affect:  Flat  Thought Process:  Coherent, Goal Directed, and Linear  Thought Content:  Logical, Hallucinations: Previous, visual, auditory command (HI/SI), and Paranoid Ideation  Suicidal Thoughts:  No  Homicidal Thoughts:  No  Judgement:  Fair  Insight:  Fair  Psychomotor Activity:  EPS, cogwheeling rigidity noted in bilateral elbows and wrists  Akathisia:  No  Fund of Knowledge: Fair      Assets:  Desire for Improvement Housing Social Support  Cognition:  Intact  ADL's:  Impaired  AIMS (if indicated): 1, 0, 0, 3, 1 (6/2)     Other History   These have  been pulled in through the EMR, reviewed, and updated if appropriate.  Family History:  The patient's family history includes Cancer in her maternal grandmother; Diabetes in her paternal aunt and paternal uncle; Heart disease in her father and mother; Hypertension in her mother; Stroke in her mother.  Medical History: Past Medical History:  Diagnosis Date   Arthritis    Cancer (HCC)    Diabetes mellitus without complication (HCC)    H/O mastectomy    left side   History of breast reconstruction    Hypertension    Stroke [facility])    mini strokes    Surgical History: Past Surgical History:  Procedure Laterality Date   MASTECTOMY Left 04/29/2015   REDUCTION MAMMAPLASTY Right    SHOULDER SURGERY Right    TUBAL LIGATION       Medications:   Current Facility-Administered Medications:    acetaminophen  (TYLENOL ) tablet 650 mg, 650 mg, Oral, Q6H PRN, Bary Boss, DO, 650 mg at 05/29/24 2130   amLODipine  (NORVASC ) tablet 10 mg, 10 mg, Oral, Daily, Arne Langdon, MD, 10 mg at 05/29/24 0952   ARIPiprazole (ABILIFY) tablet 3 mg, 3 mg, Oral, Daily, Gwyndolyn Lerner, MD, 3 mg at 05/29/24 8657   aspirin  EC tablet 81 mg, 81 mg, Oral, Daily, Arne Langdon, MD, 81 mg at 05/29/24 8469   atorvastatin  (LIPITOR) tablet 40 mg, 40 mg, Oral, Daily, Pahwani, Ravi, MD, 40 mg at 05/29/24 6295   busPIRone  (BUSPAR ) tablet 10 mg, 10 mg, Oral, BID, Arne Langdon, MD, 10 mg at 05/29/24 2841   calcium  carbonate (TUMS - dosed in mg elemental calcium ) chewable tablet 200 mg of elemental calcium , 1 tablet, Oral, TID WC, Pahwani, Ravi, MD   clopidogrel  (PLAVIX ) tablet 75 mg, 75 mg, Oral, Daily, Arne Langdon, MD, 75 mg at 05/29/24 3244   [COMPLETED] cyanocobalamin (VITAMIN B12) injection 1,000 mcg, 1,000 mcg, Intramuscular, Q1200, 1,000 mcg at 05/27/24 1211 **FOLLOWED BY** cyanocobalamin (VITAMIN B12) tablet 1,000 mcg, 1,000 mcg, Oral, Daily, Althia Atlas, MD, 1,000 mcg at 05/29/24 0102   doxazosin  (CARDURA )  tablet 4 mg, 4 mg, Oral, BID, Arne Langdon, MD, 4 mg at 05/29/24 0959   enoxaparin  (LOVENOX ) injection 40 mg, 40 mg, Subcutaneous, Daily, Hall, Carole N, DO, 40 mg at 05/29/24 7253   hydrALAZINE  (APRESOLINE ) injection 10 mg, 10 mg, Intravenous, Q6H PRN, 10 mg at 05/25/24 1644 **OR** hydrALAZINE  (APRESOLINE ) tablet 50 mg, 50 mg, Oral, Q6H PRN, Althia Atlas, MD, 50 mg at 05/24/24 1109   hydrOXYzine  (ATARAX ) tablet 10 mg, 10 mg, Oral, Daily PRN, Arne Langdon, MD, 10 mg at 05/29/24 [phone]  insulin  aspart (novoLOG ) injection 0-5 Units, 0-5 Units, Subcutaneous, QHS, Bary Boss, DO, 3 Units at 05/28/24 2133   insulin  aspart (novoLOG ) injection 0-9 Units, 0-9 Units, Subcutaneous, TID WC, Bary Boss, DO, 2 Units at 05/29/24 1610   ipratropium-albuterol  (DUONEB) 0.5-2.5 (3) MG/3ML nebulizer solution 3 mL, 3 mL, Nebulization, Q6H PRN, Arne Langdon, MD, 3 mL at 05/24/24 1836   isosorbide mononitrate (IMDUR) 24 hr tablet 60 mg, 60 mg, Oral, QPM, Althia Atlas, MD, 60 mg at 05/28/24 1743   melatonin tablet 5 mg, 5 mg, Oral, QHS PRN, Bary Boss, DO   metoprolol  tartrate (LOPRESSOR ) tablet 100 mg, 100 mg, Oral, BID, Althia Atlas, MD, 100 mg at 05/29/24 9604   ondansetron  (ZOFRAN -ODT) disintegrating tablet 4 mg, 4 mg, Oral, Q8H PRN, Arne Langdon, MD   polyethylene glycol (MIRALAX  / GLYCOLAX ) packet 17 g, 17 g, Oral, Daily PRN, Hall, Carole N, DO   prochlorperazine  (COMPAZINE ) injection 5 mg, 5 mg, Intravenous, Q6H PRN, Hall, Carole N, DO   QUEtiapine  (SEROQUEL ) tablet 100 mg, 100 mg, Oral, QHS, Arne Langdon, MD, 100 mg at 05/28/24 2134   thiamine (VITAMIN B1) tablet 100 mg, 100 mg, Oral, Daily, Althia Atlas, MD, 100 mg at 05/29/24 5409   Vitamin D (Ergocalciferol) (DRISDOL) 1.25 MG (50000 UNIT) capsule 50,000 Units, 50,000 Units, Oral, Q7 days, Althia Atlas, MD, 50,000 Units at 05/25/24 1138  Allergies: Allergies  Allergen Reactions   Grass Pollen(K-O-R-T-Swt Vern)    Latex Hives    Tetracyclines & Related Hives    Buzz Cass, MD

## 2024-05-30 DIAGNOSIS — I5033 Acute on chronic diastolic (congestive) heart failure: Secondary | ICD-10-CM | POA: Diagnosis not present

## 2024-05-30 LAB — GLUCOSE, CAPILLARY
Glucose-Capillary: 186 mg/dL — ABNORMAL HIGH (ref 70–99)
Glucose-Capillary: 156 mg/dL — ABNORMAL HIGH (ref 70–99)
Glucose-Capillary: 214 mg/dL — ABNORMAL HIGH (ref 70–99)

## 2024-05-30 LAB — CREATININE, SERUM
Creatinine, Ser: 1.02 mg/dL — ABNORMAL HIGH (ref 0.44–1.00)
GFR, Estimated: 58 mL/min — ABNORMAL LOW (ref >60)

## 2024-05-30 MED ORDER — SPIRONOLACTONE 25 MG PO TABS: 12.5000 mg | ORAL_TABLET | Freq: Every day | ORAL | Status: DC

## 2024-05-30 MED ORDER — VITAMIN B-1 100 MG PO TABS: 100.0000 mg | ORAL_TABLET | Freq: Every day | ORAL | Status: DC

## 2024-05-30 MED ORDER — VITAMIN D (ERGOCALCIFEROL) 1.25 MG (50000 UNIT) PO CAPS: 50000.0000 [IU] | ORAL_CAPSULE | ORAL | Status: DC

## 2024-05-30 MED ORDER — ATORVASTATIN CALCIUM 40 MG PO TABS: 40.0000 mg | ORAL_TABLET | Freq: Every day | ORAL | Status: DC

## 2024-05-30 MED ORDER — POTASSIUM CHLORIDE CRYS ER 20 MEQ PO TBCR: 20.0000 meq | EXTENDED_RELEASE_TABLET | Freq: Every day | ORAL | Status: DC

## 2024-05-30 MED ORDER — ISOSORBIDE MONONITRATE ER 60 MG PO TB24: 60.0000 mg | ORAL_TABLET | Freq: Every evening | ORAL | Status: DC

## 2024-05-30 MED ORDER — SPIRONOLACTONE 12.5 MG HALF TABLET
12.5000 mg | ORAL_TABLET | Freq: Every day | ORAL | Status: DC
  Administered 2024-05-30: 12.5 mg via ORAL
  Filled 2024-05-30: qty 1

## 2024-05-30 MED ORDER — CYANOCOBALAMIN 1000 MCG PO TABS: 1000.0000 ug | ORAL_TABLET | Freq: Every day | ORAL | Status: DC

## 2024-05-30 MED ORDER — FUROSEMIDE 40 MG PO TABS
40.0000 mg | ORAL_TABLET | Freq: Every day | ORAL | Status: DC
  Administered 2024-05-30: 40 mg via ORAL
  Filled 2024-05-30: qty 1

## 2024-05-30 MED ORDER — ARIPIPRAZOLE 2 MG PO TABS: 3.0000 mg | ORAL_TABLET | Freq: Every day | ORAL | Status: DC

## 2024-05-30 NOTE — Progress Notes (Signed)
 Physical Therapy Treatment Patient Details Name: Beth Blankenship MRN: [id number] DOB: [birth year] Today's Date: 05/30/2024   History of Present Illness Pt is a 73 y/o F presenting to ED on 5/30 wtih SOB, admitted for acute CHF exacerbation. PMH includes HTN, CVA, breast CA p/w SOB, elevated BNP    PT Comments  Pt greeted seated in recliner chair, pleasant and agreeable to PT session. Pt engaged in two short distance bouts of ambulation. She required modA to maintain stability and safety while mobilizing and a chair follow. Pt took short rapid shuffling steps and quickly fatigued requiring a prolonged seated rest break. Pt participated in transfer training completing 5 reps of sit<>stands and 4 reps of bed<>chair. Extensive education on sequencing and multi-modal cueing to improve pt's technique to prevent posterior lean and feet from slipping out from underneath her. She required min-modA increasing to +2 assist as she fatigued. Patient will benefit from continued inpatient follow up therapy, <3 hours/day.     If plan is discharge home, recommend the following: A lot of help with walking and/or transfers;A little help with bathing/dressing/bathroom;Assistance with cooking/housework;Assist for transportation   Can travel by private vehicle     No  Equipment Recommendations  BSC/3in1    Recommendations for Other Services       Precautions / Restrictions Precautions Precautions: Fall Recall of Precautions/Restrictions: Intact Precaution/Restrictions Comments: 4L O2 baseline Restrictions Weight Bearing Restrictions Per Provider Order: No     Mobility  Bed Mobility               General bed mobility comments: Not assessed. Pt greeted in recliner chair and returned there at end of session.    Transfers Overall transfer level: Needs assistance Equipment used: Rolling walker (2 wheels) Transfers: Sit to/from Stand, Bed to chair/wheelchair/BSC Sit to Stand: Min assist, Mod  assist, +2 safety/equipment   Step pivot transfers: Min assist, Mod assist, +2 safety/equipment       General transfer comment: Pt stood from recliner chair and lowest bed height. She required cues to scoot fwd until R foot is fully in contact with the floor. Her L foot is unable to approximate d/t L ankle PF contracture. When attempting to power up pt tends to maintain a posterior lean, L knee begins to extend, and both feet slide. Cued pt to increased WBing in R heel and bring her nose over her toes. Demonstrated proper sequencing technique and educated her on the use of momentum to help power up into standing. She required min-modA to rise. As she fatigued, +2 assist was provided. Once standing pt required modA to stabilize and balance as she pivoted to turn between chair and bed. She takes multiple short rapid steps and pivots on her R foot to faciliate turn. Poor eccentric control. Cued pt to go slower and reach back for the surface she was going towards.    Ambulation/Gait Ambulation/Gait assistance: Mod assist, +2 safety/equipment (chair follow) Gait Distance (Feet): 8 Feet (1x5, 1x8) Assistive device: Rolling walker (2 wheels) Gait Pattern/deviations: Step-to pattern, Decreased dorsiflexion - left, Decreased step length - right, Decreased step length - left, Shuffle, Trunk flexed Gait velocity: decreased     General Gait Details: Pt ambulated with short, rapid steps within the room. She was unsteady and required modA for stability/balance and cues to increased UE WBing into RW. Pt maintained a fwd flex lean and quickly fatigued requiring seated rest break.   Stairs  Wheelchair Mobility     Tilt Bed    Modified Rankin (Stroke Patients Only)       Balance Overall balance assessment: Needs assistance Sitting-balance support: Feet supported, Bilateral upper extremity supported Sitting balance-Leahy Scale: Fair Sitting balance - Comments: Pt able to scoot  fwd/bkwd with supervision-CGA for safety.   Standing balance support: Bilateral upper extremity supported, Reliant on assistive device for balance, During functional activity Standing balance-Leahy Scale: Poor Standing balance comment: Pt dependent on RW and modA x1-2 during transfers/gait                            Communication Communication Communication: No apparent difficulties  Cognition Arousal: Alert Behavior During Therapy: WFL for tasks assessed/performed, Anxious   PT - Cognitive impairments: No apparent impairments                         Following commands: Intact      Cueing Cueing Techniques: Verbal cues, Tactile cues  Exercises      General Comments General comments (skin integrity, edema, etc.): VSS on 2L O2 via [place]      Pertinent Vitals/Pain Pain Assessment Pain Assessment: No/denies pain    Home Living                          Prior Function            PT Goals (current goals can now be found in the care plan section) Acute Rehab PT Goals Patient Stated Goal: Get back to moving on my own Progress towards PT goals: Progressing toward goals    Frequency    Min 1X/week      PT Plan      Co-evaluation              AM-PAC PT "6 Clicks" Mobility   Outcome Measure  Help needed turning from your back to your side while in a flat bed without using bedrails?: A Little Help needed moving from lying on your back to sitting on the side of a flat bed without using bedrails?: A Little Help needed moving to and from a bed to a chair (including a wheelchair)?: A Lot Help needed standing up from a chair using your arms (e.g., wheelchair or bedside chair)?: A Lot Help needed to walk in hospital room?: Total Help needed climbing 3-5 steps with a railing? : Total 6 Click Score: 12    End of Session Equipment Utilized During Treatment: Gait belt;Oxygen  Activity Tolerance: Patient tolerated treatment well;Patient  limited by fatigue Patient left: in chair;with call bell/phone within reach Nurse Communication: Mobility status PT Visit Diagnosis: Muscle weakness (generalized) (M62.81);Difficulty in walking, not elsewhere classified (R26.2);Other symptoms and signs involving the nervous system (R29.898)     Time: 0865-7846 PT Time Calculation (min) (ACUTE ONLY): 27 min  Charges:    $Gait Training: 8-22 mins $Therapeutic Activity: 8-22 mins PT General Charges $$ ACUTE PT VISIT: 1 Visit                     Glenford Lanes, PT, DPT Acute Rehabilitation Services Office: [phone] Secure Chat Preferred  Riva Chester 05/30/2024, 12:42 PM

## 2024-05-30 NOTE — TOC Transition Note (Signed)
 Transition of Care [facility]) - Discharge Note   Patient Details  Name: Beth Blankenship MRN: [id number] Date of Birth: 08/14/1951  Transition of Care [facility]) CM/SW Contact:  Arron Big, LCSWA Phone Number: 05/30/2024, 1:45 PM   Clinical Narrative:   Patient will DC to: [facility]  Anticipated DC date: 05/30/24  Family notified: Denman Fischer (spouse) Transport by: Lyna Sandhoff   Per MD patient ready for DC to [facility]. RN to call report prior to discharge [phone]; room 103 ). RN, patient, patient's family, and facility notified of DC. Discharge Summary and FL2 sent to facility. DC packet on chart. Ambulance transport requested for patient at 1:32PM.   CSW will sign off for now as social work intervention is no longer needed. Please consult us  again if new needs arise.   Final next level of care: Skilled Nursing Facility Barriers to Discharge: Barriers Resolved   Patient Goals and CMS Choice Patient states their goals for this hospitalization and ongoing recovery are:: To go to SNF rehab          Discharge Placement              Patient chooses bed at: [facility] Patient to be transferred to facility by: [facility] Name of family member notified: Denman Fischer (spouse) Patient and family notified of of transfer: 05/30/24  Discharge Plan and Services Additional resources added to the After Visit Summary for   In-house Referral: Clinical Social Work                                   Social Drivers of Health (SDOH) Interventions SDOH Screenings   Food Insecurity: No Food Insecurity (05/23/2024)  Housing: Low Risk  (05/29/2024)  Recent Concern: Housing - High Risk (05/16/2024)   Received from [facility]  Transportation Needs: No Transportation Needs (05/23/2024)  Utilities: Not At Risk (05/23/2024)  Depression (PHQ2-9): High Risk (10/31/2022)  Financial Resource Strain: Medium Risk (05/16/2024)   Received from [facility]  Physical Activity: Unknown  (08/09/2022)   Received from [facility], [facility]  Recent Concern: Physical Activity - Inactive (08/09/2022)   Received from [facility]  Social Connections: Socially Integrated (05/29/2024)  Stress: No Stress Concern Present (08/19/2023)   Received from [facility]  Tobacco Use: Medium Risk (05/29/2024)     Readmission Risk Interventions     No data to display

## 2024-05-30 NOTE — TOC Progression Note (Addendum)
 Transition of Care [facility]) - Progression Note    Patient Details  Name: Beth Blankenship MRN: [id number] Date of Birth: 1951-03-05  Transition of Care [facility]) CM/SW Contact  Arron Big, [place] Phone Number: 05/30/2024, 10:19 AM  Clinical Narrative:   Patients dtr, Shelvy Dickens, called CSW yesterday evening to inform they would like [facility] for SNF. CSW reached out to facility this morning to inform admissions and ask about bed availability. Awaiting a response. CSW will need to start insurance auth.   10:44 AM CSW called Kia with [facility], at this time they no longer have a female bed for STR. CSW called Shelvy Dickens to inform her of that, Shelvy Dickens gave CSW permission to reach out to [facility] about bed availability.   11:04 AM Nicki with [facility] can accept patient. CSW started [place] and it has been approved; approval dates 05/30/2024-06/03/2024. Auth id [id number]. Awaiting [facility] to confirm if they can accept patient today.   TOC will continue to follow.    Expected Discharge Plan: Skilled Nursing Facility Barriers to Discharge: Continued Medical Work up, SNF Pending bed offer, Insurance Authorization  Expected Discharge Plan and Services In-house Referral: Clinical Social Work     Living arrangements for the past 2 months: Single Family Home                                       Social Determinants of Health (SDOH) Interventions SDOH Screenings   Food Insecurity: No Food Insecurity (05/23/2024)  Housing: Low Risk  (05/29/2024)  Recent Concern: Housing - High Risk (05/16/2024)   Received from [facility]  Transportation Needs: No Transportation Needs (05/23/2024)  Utilities: Not At Risk (05/23/2024)  Depression (PHQ2-9): High Risk (10/31/2022)  Financial Resource Strain: Medium Risk (05/16/2024)   Received from [facility]  Physical Activity: Unknown (08/09/2022)   Received from [facility], [facility]  Recent Concern: Physical Activity - Inactive (08/09/2022)   Received from  [facility]  Social Connections: Socially Integrated (05/29/2024)  Stress: No Stress Concern Present (08/19/2023)   Received from [facility]  Tobacco Use: Medium Risk (05/29/2024)    Readmission Risk Interventions     No data to display

## 2024-05-30 NOTE — Plan of Care (Signed)
  Problem: Clinical Measurements: Goal: Will remain free from infection Outcome: Progressing   Problem: Clinical Measurements: Goal: Diagnostic test results will improve Outcome: Progressing   Problem: Clinical Measurements: Goal: Cardiovascular complication will be avoided Outcome: Progressing   Problem: Activity: Goal: Risk for activity intolerance will decrease Outcome: Progressing   Problem: Coping: Goal: Level of anxiety will decrease Outcome: Progressing

## 2024-05-30 NOTE — Consult Note (Signed)
 [facility] Psychiatric Consult Follow-up  Patient Name: .Beth Blankenship  MRN: [id number]  DOB: 01-01-1951  Consult Order details:  Orders (From admission, onward)     Start     Ordered   05/26/24 1115  IP CONSULT TO PSYCHIATRY       Ordering Provider: Althia Atlas, MD  Provider:  (Not yet assigned)  Question Answer Comment  Location [facility]   Reason for Consult? please eval for Extrapyramidal symptoms, c/o left hand tremors and difficulty walking.      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 female admitted: Presented to the EDfor 05/23/2024  1:20 AM for HFpEF exacerbation. 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 She does not meet criteria for involuntary commitment based on no threat to self or others.  Current outpatient psychotropic medications include Invega  3 mg daily, Seroquel  100 mg nightly, BuSpar  10 mg twice daily and historically she has had a good response to these medications. She endorsed compliance with medications, but that husband was in charge of these.  Please see plan below for detailed recommendations.   Diagnoses:  Active Hospital problems: Principal Problem:   Acute exacerbation of CHF (congestive heart failure) (HCC) Active Problems:   Psychosis in elderly [facility])    Plan   ## Psychiatric Medication  Recommendations:  - Continue Abilify 3.0 mg daily to target paranoia and mood stability. - Continue Seroquel  100 mg nightly. - Continue BuSpar  10 mg twice daily. - We will SIGN OFF at this time.   ## Medical Decision Making Capacity: Not specifically addressed in this encounter  ## Further Work-up:  - Recommend outpatient neurology follow-up for suspected parkinsonian symptoms and symptomatology suggestive of Dementia with Lewy body - Recommend MRI for further organic workup  - Agree with vitamin B12 repletion - Defer T2DM management at A1c: 8.6% to primary team While pt on Qtc prolonging medications, please monitor & replete K+ to 4 and Mg2+ to 2 -- most recent EKG on 5/30 had QtC of 452 -- Pertinent labwork reviewed earlier this admission includes: B12 316   ## Disposition:-- There are no psychiatric contraindications to discharge at this time  ## Behavioral / Environmental: - No specific recommendations at this time.     ## Safety and Observation Level:  - Based on my clinical evaluation, I estimate the patient to be at minimal risk of self harm in the current setting. - At this time, we recommend  routine. This decision is based on my review of the chart including patient's history and current presentation, interview of the patient, mental status examination, and consideration of suicide risk including evaluating suicidal ideation, plan, intent, suicidal or self-harm behaviors, risk factors, and protective factors. This judgment is based on our ability to directly address suicide risk, implement suicide prevention strategies, and develop a safety plan while the patient is in the  clinical setting. Please contact our team if there is a concern that risk level has changed.  CSSR Risk Category:C-SSRS RISK CATEGORY: No Risk  Suicide Risk Assessment: Patient has following modifiable risk factors for suicide: None Patient has following non-modifiable or demographic risk factors for  suicide: psychiatric hospitalization Patient has the following protective factors against suicide: Access to outpatient mental health care, Supportive family, Frustration tolerance, no history of suicide attempts, and no history of NSSIB  Thank you for this consult request. Recommendations have been communicated to the primary team.  We will SIGN OFF at this time.   Ayson Cherubini, MD       History of Present Illness  Relevant Aspects of Hospital Course:  Admitted on 05/23/2024 for heart failure with preserved ejection fraction exacerbation patient was bradycardic and hypertensive in the ED.  Also noted AKI.  Started IV Lasix  40 mg twice daily.  Patient respiratory status improved.  Psychiatry was consulted for evaluation of extrapyramidal symptoms in the setting of dual antipsychotic therapy (Invega  3 mg daily, quetiapine  700 mg nightly).  Patient Report:  05/28/2024: On extensive conversation with patient, continues to have "weird" feeling in her chest, which resembles subjective respiratory distress (O2 saturation 96% on monitor).  Patient is very down and depressed today, says that she experiences this at home as well "except at home I have my husband" to help with ambulation.  Patient has a new pill rolling tremor in her left upper extremity that appears unintentional.  When patient's attention was directed to the tremor, she said that she was not doing this on purpose.  On distraction, the tremor resumed.  Patient endorsed a 10 to 15-second period of left upper extremity shaking, causing her to drop a utensil, which resolved.  Patient is distraught that she cannot fully interact with her family, because of her reduced mobility.  Discussed patient's family -- patient and husband have been together for 40+ years, patient has 2 daughters, 1 of whom is a Comptroller at [employer].  Patient is proud of her children.  Is intermittently tearful and states "I am going to die."  Reassured patient that her vital  signs at present were stable on the monitor while validating patient's psychic distress.  Does not currently receive PT at home.  Also notes that "the weird feeling" is related to patient's paranoia about the devil, "I always feel like that" for approximately a year.  Says that she has felt this way since moving into her new house before this hospitalization.  The patient repeatedly said that her antipsychotic medications have not been helpful in staving off this feeling.  When asked if she would be willing to return to her home dose of Invega , patient was amenable, even if this made the episodes of shaking worse.  Reinforced the patient will need neurology follow-up for Parkinson versus Dementia with Lewy Body workup.  Also encouraged home physical therapy in order to regain as much function as possible as well as cognitive behavioral therapy to talk about these feelings.   05/29/2024: The patient was seen today and the chart was reviewed.  The husband was present at the bedside.  The patient continues to endorse" feeling weird" and anxiety.  She continues to endorse the fear that the devil was out to get her.  Husband reports that her symptoms started about a year ago after she was diagnosed with CHF.  He reports that she had depression with some psychotic symptoms.  Today she denies any auditory  or visual hallucinations But does endorse paranoia that something was out to get her.  Her medications were switched to Abilify 3 mg a day with the option to go higher as tolerated.  Patient would have received her first dose today.  We will continue to evaluate progress.  05/30/2024: Husband present at bedside.  Patient feels "much better" this morning.  Still notes ongoing, unchanged paranoia, but has not had an episode of shaking in the last 24 hours.  Mood is "pretty good".  Patient respiratory status is also improved.  Continues to deny suicidal or homicidal ideation as well as auditory and visual  hallucinations.  Says her goal is to "learn to put things in their proper place" in regards to paranoia about the devil, and that her husband knows the Bible well and assists her when she gets worried.  Husband agrees the patient's mental status is better.  Pill-rolling tremor noted in left upper extremity, however this is much less pronounced than previous.  Believes transition to Abilify from paliperidone  may have improved her concerning symptomatology somewhat, however we will need close follow-up with psychiatric provider to further adjust medications.  Continued to believe that new onset visual hallucinations in this patient's seventh decade of life, parkinsonian symptoms and inefficacy of Invega  suggests a primary neurological process which will need thorough outpatient workup.  Primary team anticipates discharge within the next 48 hours -- we will continue the same medications at the current dosages and sign off at this time.   Psych ROS:  Depression: Positive for the following neurovegetative symptoms over the last 2 weeks: Low mood, difficulty enjoying things she has previously enjoyed, low energy, difficulty concentrating, psychomotor slowing, and passive suicidal ideation "sometimes".  Last endorsed 2 days ago.  Denies history of active suicidal ideation.  Denies plan. Anxiety: Patient endorses general anxiety out of proportion to stressor about many aspects of her life. Mania (lifetime and current): Patient denies history of 4+ days of elevated and expansive mood including talkativeness, irritability, disinhibition, etc. Psychosis: (lifetime and current): As above, patient endorses new onset visual and auditory hallucinations around the age of 32.  Adamant that previous to that time, patient has never had hallucinations of any sort.  Review of Systems  Psychiatric/Behavioral:  Negative for hallucinations.   All other systems reviewed and are negative.    Psychiatric and Social History   Psychiatric History:  Information collected from patient  Prev Dx/Sx: Schizoaffective disorder, bipolar type; major depressive disorder, with psychotic features Current Psych Provider:  Home Meds (current): PO Invega  3 mg daily, Seroquel  100 mg nightly, BuSpar  10 mg twice daily Previous Med Trials: Zoloft  150 mg (may be current med, will need to get in touch with husband) Therapy: Previously  Prior Psych Hospitalization: 2-3 times at [facility] Prior Self Harm: Denies Prior Violence: Denies  Family Psych History: Close family member had "a mental breakdown", however denies history of bipolar disorder and schizophrenia in immediate family Family Hx suicide: Denies  Social History:  Developmental Hx: Unremarkable Educational Hx: 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 Hx: None Living Situation: Husband and daughters (2) Access to weapons/lethal means: Denies  Substance History Alcohol: Previously in college Last Drink unknown Number of drinks per day unknown History of alcohol withdrawal seizures denies History of DT's denies Tobacco: Remote Illicit drugs: Denies Prescription drug abuse: Denies Rehab hx: Denies  Exam Findings  Physical Exam:  Vital Signs:  Temp:  [97.6 F (36.4 C)-98.5 F (36.9 C)] 97.7 F (  36.5 C) (06/06 0734) Pulse Rate:  [45-72] 63 (06/06 0459) Resp:  [10-21] 19 (06/06 0459) BP: (154-178)/(76-87) 178/82 (06/06 0734) SpO2:  [91 %-98 %] 92 % (06/06 0459) Weight:  [81.4 kg] 81.4 kg (06/06 0459) Blood pressure (!) 178/82, pulse 63, temperature 97.7 F (36.5 C), temperature source Oral, resp. rate 19, height 5\' 3"  (1.6 m), weight 81.4 kg, SpO2 92%. Body mass index is 31.79 kg/m.  Physical Exam Constitutional:      General: She is not in acute distress.    Appearance: She is normal weight.  Pulmonary:     Effort: Pulmonary effort is normal. No tachypnea.  Neurological:     General: No focal deficit present.     Mental Status:  She is alert.     Comments: Cogwheeling rigidity noted in bilateral elbows and wrists on physical exam, similar to 4.  Also, new pill-rolling tremor noted in left upper extremity.     Mental Status Exam: General Appearance: Casual, flat faces, some smiling  Orientation:  Full (Time, Place, and Person)  Memory:  Immediate;   Fair Recent;   Good  Concentration:  Concentration: Fair and Attention Span: Fair  Recall:  Fair  Attention  Good  Eye Contact:  Good  Speech:  Slow and some speech latency  Language:  Good  Volume:  Normal  Mood: Euthymic  Affect:  Flat  Thought Process:  Coherent, Goal Directed, and Linear  Thought Content:  Logical, Hallucinations: Previous, visual, auditory command (HI/SI), and Paranoid Ideation  Suicidal Thoughts:  No  Homicidal Thoughts:  No  Judgement:  Fair  Insight:  Fair  Psychomotor Activity:  Cogwheeling rigidity in elbows noted as before, pill-rolling tremor improved  Akathisia:  No  Fund of Knowledge: Fair      Assets:  Desire for Improvement Housing Social Support  Cognition:  Intact  ADL's:  Impaired  AIMS (if indicated): 1, 0, 0, 3, 1 (6/2)     Other History   These have been pulled in through the EMR, reviewed, and updated if appropriate.  Family History:  The patient's family history includes Cancer in her maternal grandmother; Diabetes in her paternal aunt and paternal uncle; Heart disease in her father and mother; Hypertension in her mother; Stroke in her mother.  Medical History: Past Medical History:  Diagnosis Date   Arthritis    Cancer (HCC)    Diabetes mellitus without complication (HCC)    H/O mastectomy    left side   History of breast reconstruction    Hypertension    Stroke [facility])    mini strokes    Surgical History: Past Surgical History:  Procedure Laterality Date   MASTECTOMY Left 04/29/2015   REDUCTION MAMMAPLASTY Right    SHOULDER SURGERY Right    TUBAL LIGATION       Medications:   Current  Facility-Administered Medications:    acetaminophen  (TYLENOL ) tablet 650 mg, 650 mg, Oral, Q6H PRN, Bary Boss, DO, 650 mg at 05/29/24 0981   amLODipine  (NORVASC ) tablet 10 mg, 10 mg, Oral, Daily, Arne Langdon, MD, 10 mg at 05/29/24 0952   ARIPiprazole (ABILIFY) tablet 3 mg, 3 mg, Oral, Daily, Gwyndolyn Lerner, MD, 3 mg at 05/29/24 0959   aspirin  EC tablet 81 mg, 81 mg, Oral, Daily, Arne Langdon, MD, 81 mg at 05/29/24 0954   atorvastatin  (LIPITOR) tablet 40 mg, 40 mg, Oral, Daily, Pahwani, Ravi, MD, 40 mg at 05/29/24 1914   busPIRone  (BUSPAR ) tablet 10 mg, 10 mg, Oral, BID,  Arne Langdon, MD, 10 mg at 05/29/24 2101   calcium  carbonate (TUMS - dosed in mg elemental calcium ) chewable tablet 200 mg of elemental calcium , 1 tablet, Oral, TID WC, Pahwani, Ravi, MD, 200 mg of elemental calcium  at 05/29/24 1709   clopidogrel  (PLAVIX ) tablet 75 mg, 75 mg, Oral, Daily, Arne Langdon, MD, 75 mg at 05/29/24 0951   [COMPLETED] cyanocobalamin (VITAMIN B12) injection 1,000 mcg, 1,000 mcg, Intramuscular, Q1200, 1,000 mcg at 05/27/24 1211 **FOLLOWED BY** cyanocobalamin (VITAMIN B12) tablet 1,000 mcg, 1,000 mcg, Oral, Daily, Althia Atlas, MD, 1,000 mcg at 05/29/24 1610   doxazosin  (CARDURA ) tablet 4 mg, 4 mg, Oral, BID, Arne Langdon, MD, 4 mg at 05/29/24 2101   enoxaparin  (LOVENOX ) injection 40 mg, 40 mg, Subcutaneous, Daily, Hall, Carole N, DO, 40 mg at 05/29/24 9604   hydrALAZINE  (APRESOLINE ) injection 10 mg, 10 mg, Intravenous, Q6H PRN, 10 mg at 05/25/24 1644 **OR** hydrALAZINE  (APRESOLINE ) tablet 50 mg, 50 mg, Oral, Q6H PRN, Althia Atlas, MD, 50 mg at 05/24/24 1109   hydrOXYzine  (ATARAX ) tablet 10 mg, 10 mg, Oral, Daily PRN, Arne Langdon, MD, 10 mg at 05/29/24 5409   insulin  aspart (novoLOG ) injection 0-5 Units, 0-5 Units, Subcutaneous, QHS, Hall, Carole N, DO, 2 Units at 05/29/24 2129   insulin  aspart (novoLOG ) injection 0-9 Units, 0-9 Units, Subcutaneous, TID WC, Reesa Cannon N, DO, 2 Units at  05/30/24 8119   ipratropium-albuterol  (DUONEB) 0.5-2.5 (3) MG/3ML nebulizer solution 3 mL, 3 mL, Nebulization, Q6H PRN, Arne Langdon, MD, 3 mL at 05/24/24 1836   isosorbide mononitrate (IMDUR) 24 hr tablet 60 mg, 60 mg, Oral, QPM, Althia Atlas, MD, 60 mg at 05/29/24 1708   melatonin tablet 5 mg, 5 mg, Oral, QHS PRN, Del Favia, Carole N, DO   metoprolol  tartrate (LOPRESSOR ) tablet 100 mg, 100 mg, Oral, BID, Althia Atlas, MD, 100 mg at 05/29/24 2101   ondansetron  (ZOFRAN -ODT) disintegrating tablet 4 mg, 4 mg, Oral, Q8H PRN, Arne Langdon, MD   polyethylene glycol (MIRALAX  / GLYCOLAX ) packet 17 g, 17 g, Oral, Daily PRN, Hall, Carole N, DO   prochlorperazine  (COMPAZINE ) injection 5 mg, 5 mg, Intravenous, Q6H PRN, Hall, Carole N, DO   QUEtiapine  (SEROQUEL ) tablet 100 mg, 100 mg, Oral, QHS, Arne Langdon, MD, 100 mg at 05/29/24 2101   sodium chloride  flush (NS) 0.9 % injection 10-40 mL, 10-40 mL, Intracatheter, Q12H, Pahwani, Ravi, MD, 10 mL at 05/29/24 2102   sodium chloride  flush (NS) 0.9 % injection 10-40 mL, 10-40 mL, Intracatheter, PRN, Modena Andes, MD   thiamine (VITAMIN B1) tablet 100 mg, 100 mg, Oral, Daily, Althia Atlas, MD, 100 mg at 05/29/24 1478   Vitamin D (Ergocalciferol) (DRISDOL) 1.25 MG (50000 UNIT) capsule 50,000 Units, 50,000 Units, Oral, Q7 days, Althia Atlas, MD, 50,000 Units at 05/25/24 1138  Allergies: Allergies  Allergen Reactions   Grass Pollen(K-O-R-T-Swt Vern)    Latex Hives   Tetracyclines & Related Hives    Pepe Mineau, MD

## 2024-05-30 NOTE — Plan of Care (Signed)
  Problem: Education: Goal: Ability to describe self-care measures that may prevent or decrease complications (Diabetes Survival Skills Education) will improve Outcome: Progressing Goal: Individualized Educational Video(s) Outcome: Progressing   Problem: Coping: Goal: Ability to adjust to condition or change in health will improve Outcome: Progressing   Problem: Fluid Volume: Goal: Ability to maintain a balanced intake and output will improve Outcome: Progressing   Problem: Health Behavior/Discharge Planning: Goal: Ability to identify and utilize available resources and services will improve Outcome: Progressing Goal: Ability to manage health-related needs will improve Outcome: Progressing   Problem: Metabolic: Goal: Ability to maintain appropriate glucose levels will improve Outcome: Progressing   Problem: Nutritional: Goal: Maintenance of adequate nutrition will improve Outcome: Progressing Goal: Progress toward achieving an optimal weight will improve Outcome: Progressing   Problem: Skin Integrity: Goal: Risk for impaired skin integrity will decrease Outcome: Progressing   Problem: Tissue Perfusion: Goal: Adequacy of tissue perfusion will improve Outcome: Progressing   Problem: Education: Goal: Knowledge of General Education information will improve Description: Including pain rating scale, medication(s)/side effects and non-pharmacologic comfort measures Outcome: Progressing   Problem: Health Behavior/Discharge Planning: Goal: Ability to manage health-related needs will improve Outcome: Progressing   Problem: Clinical Measurements: Goal: Ability to maintain clinical measurements within normal limits will improve Outcome: Progressing Goal: Will remain free from infection Outcome: Progressing Goal: Diagnostic test results will improve Outcome: Progressing Goal: Respiratory complications will improve Outcome: Progressing Goal: Cardiovascular complication will  be avoided Outcome: Progressing   Problem: Activity: Goal: Risk for activity intolerance will decrease Outcome: Progressing   Problem: Coping: Goal: Level of anxiety will decrease Outcome: Progressing   Problem: Safety: Goal: Ability to remain free from injury will improve Outcome: Progressing   Problem: Skin Integrity: Goal: Risk for impaired skin integrity will decrease Outcome: Progressing   

## 2024-05-30 NOTE — Discharge Summary (Addendum)
 Physician Discharge Summary  Valincia Touch ZOX:[id number] DOB: 09-Sep-1951 DOA: 05/23/2024  PCP: Patient, No Pcp Per  Admit date: 05/23/2024 Discharge date: 05/30/2024  Time spent: 45 minutes  Recommendations for Outpatient Follow-up:  Needs outpatient neurology follow-up for evaluation of tremors, cognitive deficits, hallucinations which are intermittent SNF for short-term rehab PCP in 1 week, please check BMP at follow-up Cardiology follow-up for CHF and pulmonary hypertension, recommend sleep study   Discharge Diagnoses:  Acute on chronic diastolic CHF Pulmonary hypertension History of CVA Hallucinations Schizophrenia Major depressive disorder Tremors Acute on chronic hypoxic respiratory failure AKI on CKD2 History of subarachnoid hemorrhage B12 deficiency Vitamin D deficiency Type 2 diabetes mellitus  Discharge Condition: Improving  Diet recommendation: Diabetic, low-sodium, heart healthy  Filed Weights   05/28/24 0145 05/29/24 0516 05/30/24 0459  Weight: 80 kg 82.8 kg 81.4 kg    History of present illness:  73 y.o. female with medical history significant of HTN, CVA, schizophrenia, major depressive disorder and breast cancer p/w SOB, and elevated BNP c/w HFpEF exacerbation. Pt was in her USOH until 1600 yesterday she experienced SOB while watching television. She turned up her home O2 from 4L [place] to 5L [place] and had mild to no improvement. She continued throughout the day with SOB that worsened with mild activity like getting up to go to the restroom. She became increasingly concerned when she went to bed at 2300 and was unable to lay flat; in fact, she felt like she was "drowning" and activated EMS.   In the ED, pt bradycardic and hypertensive. Labs notable for Cr 1.36 (baseline 0.8) and BNP 916. CXR with b/l pulmonary edema.    Hospital Course:   Acute hypoxic respiratory failure  Acute on chronic diastolic CHF  Severe pulmonary hypertension  TTE LVEF 60-65%,  moderately decreased RV systolic function, severe PAH - Diuresed with IV Lasix , volume status has improved -Required BiPAP only on admission, has not required this for the last 5 days, hence will DC to SNF without BiPAP -Resumed home regimen of Jardiance -Started on low-dose Aldactone -Needs cardiology and pulmonary follow-up when mental status is more stable after rehab   # AKI on CKD stage II Baseline sCr 0.70.  Presented with creatinine of 1.26 which peaked at 1.44.  Improving and down to 1.05.  Will need to avoid all nephrotoxic agents but needs challenge with the IV Lasix  due to CHF.   # Severe pulmonary hypertension  RVSP 86.5 mmHg; presumably related COPD/OSA - Clinically improved with diuretics as above, weaned down from 4 to 2 L O2 - Recommended to follow with pulmonologist as an outpatient for PFTs to exclude COPD vs sleep study to rule out OSA   H/o HTN - Blood pressure fairly controlled continue PTA amlodipine    H/o DM2 -SSI TID AC prn    H/o CVA -PTA aspirin  and plavix    H/o Subarachnoid hemorrhage and subdural hematoma s/p fall on 5/1 5/31 CT Head: 1. Interval decrease in size of right frontoparietal convexity subdural hemorrhage, now measuring up to 3 mm. 2. Interval resolution of the previously seen subarachnoid hemorrhage along the right frontal lobe. 3. Stable background of chronic small vessel disease with old infarcts in the superior right cerebellar hemisphere and medial left occipital lobe. -She is on antiplatelet agents prior to admission, these were continued     H/o unspecified schizophrenia Tremors, hallucinations -PTA Invega  3mg  daily, buspar  and seroquel  # C/o tremors in the left hand and does not feel herself, weird feelings  and lightheadedness. EEG: Negative for seizures, generalized encephalopathy.  6/2 psych consulted for possible parkinsonian symptoms.  They were concerned for extraparametal side effects from antipsychotics, discontinued Invega   and started her on Abilify - Psych also recommended outpatient neurology follow-up for evaluation of Lewy body dementia she declined MRI brain at this time -Overall improving and stable discharge to SNF for short-term rehab, will need neurology follow-up, referral sent   # Vitamin D deficiency: started vitamin D 50,000 units p.o. weekly, follow with PCP to repeat vitamin D level after 3 to 6 months.   # Vitamin B12 level 316, goal >400: Started vitamin B12 1000 mcg IM injection daily x 3  days, followed by oral supplement.  Follow-up PCP to repeat vitamin B12 level after 3 to 6 months.   Discharge Exam: Vitals:   05/30/24 1029 05/30/24 1135  BP:  (!) 172/76  Pulse: 71   Resp:    Temp:  98.4 F (36.9 C)  SpO2:     Gen: Awake, Alert, Oriented X 2, mild cognitive deficits HEENT: no JVD Lungs: Good air movement bilaterally, CTAB CVS: S1S2/RRR Abd: soft, Non tender, non distended, BS present Extremities: No edema Neuro: Moves all extremities, no localizing signs, minimal tremor noted in left hand Skin: no new rashes on exposed skin   Discharge Instructions   Discharge Instructions     Amb Referral to Nutrition and Diabetic Education   Complete by: As directed    Amb ref to Medical Nutrition Therapy-MNT   Complete by: As directed    Choose the type of MNT and number of hours: Initial MNT:  3 hours      Allergies as of 05/30/2024       Reactions   Grass Pollen([id number]-[id number] Vern)    Latex Hives   Tetracyclines & Related Hives        Medication List     STOP taking these medications    lisinopril  40 MG tablet Commonly known as: ZESTRIL    paliperidone  3 MG 24 hr tablet Commonly known as: Invega        TAKE these medications    albuterol  108 (90 Base) MCG/ACT inhaler Commonly known as: VENTOLIN  HFA Inhale 2 puffs into the lungs every 6 (six) hours as needed for wheezing or shortness of breath.   amLODipine  10 MG tablet Commonly known as: NORVASC  Take 1 tablet  (10 mg total) by mouth daily.   ARIPiprazole 2 MG tablet Commonly known as: ABILIFY Take 1.5 tablets (3 mg total) by mouth daily. Start taking on: May 31, 2024   aspirin  EC 81 MG tablet Take 81 mg by mouth daily. Swallow whole.   atorvastatin  40 MG tablet Commonly known as: LIPITOR Take 1 tablet (40 mg total) by mouth daily. Start taking on: May 31, 2024   busPIRone  10 MG tablet Commonly known as: BUSPAR  Take 1 tablet (10 mg total) by mouth 2 (two) times daily.   clopidogrel  75 MG tablet Commonly known as: PLAVIX  Take 75 mg by mouth daily.   cyanocobalamin 1000 MCG tablet Take 1 tablet (1,000 mcg total) by mouth daily. Start taking on: May 31, 2024   doxazosin  4 MG tablet Commonly known as: CARDURA  Take 1 tablet (4 mg total) by mouth 2 (two) times daily.   empagliflozin 10 MG Tabs tablet Commonly known as: JARDIANCE Take 10 mg by mouth daily.   folic acid 1 MG tablet Commonly known as: FOLVITE Take 1 mg by mouth daily.   furosemide  40 MG tablet Commonly known  as: LASIX  Take 40 mg by mouth daily.   hydrOXYzine  10 MG tablet Commonly known as: ATARAX  Take 1 tablet (10 mg total) by mouth daily as needed for anxiety.   isosorbide mononitrate 60 MG 24 hr tablet Commonly known as: IMDUR Take 1 tablet (60 mg total) by mouth every evening.   Melatonin 5 MG Chew Chew 10 mg by mouth.   metoprolol  tartrate 100 MG tablet Commonly known as: LOPRESSOR  Take 1 tablet (100 mg total) by mouth 2 (two) times daily.   ondansetron  4 MG disintegrating tablet Commonly known as: ZOFRAN -ODT Take 1 tablet (4 mg total) by mouth every 8 (eight) hours as needed.   potassium chloride  SA 20 MEQ tablet Commonly known as: KLOR-CON  M Take 1 tablet (20 mEq total) by mouth daily. What changed: when to take this   QUEtiapine  100 MG tablet Commonly known as: SEROQUEL  Take 1 tablet (100 mg total) by mouth at bedtime.   spironolactone 25 MG tablet Commonly known as: ALDACTONE Take 0.5  tablets (12.5 mg total) by mouth daily.   thiamine 100 MG tablet Commonly known as: Vitamin B-1 Take 1 tablet (100 mg total) by mouth daily. Start taking on: May 31, 2024   traZODone  50 MG tablet Commonly known as: DESYREL  Take 1 tablet (50 mg total) by mouth at bedtime as needed for sleep. What changed: when to take this   Vitamin D (Ergocalciferol) 1.25 MG (50000 UNIT) Caps capsule Commonly known as: DRISDOL Take 1 capsule (50,000 Units total) by mouth every 7 (seven) days. Start taking on: June 01, 2024       Allergies  Allergen Reactions   Grass Pollen(K-O-R-T-Swt Vern)    Latex Hives   Tetracyclines & Related Hives    Contact information for after-discharge care     Destination     [facility] Preferred SNF .   Service: Skilled Nursing Contact information: [address] [place]  [address] [phone]                      The results of significant diagnostics from this hospitalization (including imaging, microbiology, ancillary and laboratory) are listed below for reference.    Significant Diagnostic Studies: EEG adult Result Date: 05/25/2024 Arleene Lack, MD     05/25/2024  5:04 PM Patient Name: Harlan Vinal MRN: [id number] Epilepsy Attending: Arleene Lack Referring Physician/Provider: Althia Atlas, MD Date: 6./12/2023 Duration: 22.23 mins Patient history: 73yo F presented with SDH. EEG to evaluate for seizure Level of alertness: Awake AEDs during EEG study: None Technical aspects: This EEG study was done with scalp electrodes positioned according to the 10-20 International system of electrode placement. Electrical activity was reviewed with band pass filter of 1-70Hz , sensitivity of 7 uV/mm, display speed of 20mm/sec with a 60Hz  notched filter applied as appropriate. EEG data were recorded continuously and digitally stored.  Video monitoring was available and reviewed as appropriate. Description: The posterior  dominant rhythm consists of 7 Hz activity of moderate voltage (25-35 uV) seen predominantly in posterior head regions, symmetric and reactive to eye opening and eye closing. Hyperventilation and photic stimulation were not performed.   ABNORMALITY - Background slow IMPRESSION: This study is suggestive of mild diffuse encephalopathy. No seizures or epileptiform discharges were seen throughout the recording. Priyanka Suzanne Erps   CT HEAD WO CONTRAST ( ) Result Date: 05/24/2024 EXAM: CT HEAD WITHOUT 05/24/2024 02:29:23 PM TECHNIQUE: CT of the head was performed without the administration of intravenous contrast.  Automated exposure control, iterative reconstruction, and/or weight based adjustment of the mA/kV was utilized to reduce the radiation dose to as low as reasonably achievable. COMPARISON: 05/05/2024 CLINICAL HISTORY: Subdural hematoma. FINDINGS: BRAIN AND VENTRICLES: Interval decrease in size of right frontoparietal convexity subdural hemorrhage, now measuring up to 3 mm on coronal image 30 series 5. Interval resolution of the previously seen subarachnoid hemorrhage along the right frontal lobe. Stable background of chronic small vessel disease with old infarcts in the superior right cerebellar hemisphere and medial left occipital lobe. Gray-white differentiation is otherwise preserved. No mass effect or midline shift. No hydrocephalus. ORBITS: No acute abnormality. SINUSES AND MASTOIDS: No acute abnormality. SOFT TISSUES AND SKULL: Decreased size of the right frontal scalp hematoma. No acute skull fracture. IMPRESSION: 1. Interval decrease in size of right frontoparietal convexity subdural hemorrhage, now measuring up to 3 mm. 2. Interval resolution of the previously seen subarachnoid hemorrhage along the right frontal lobe. 3. Stable background of chronic small vessel disease with old infarcts in the superior right cerebellar hemisphere and medial left occipital lobe. Electronically signed by: Audra Blend MD 05/24/2024 03:07 PM EDT RP Workstation: [id number]   ECHOCARDIOGRAM COMPLETE Result Date: 05/23/2024    ECHOCARDIOGRAM REPORT   Patient Name:   KENDALYNN WIDEMAN Date of Exam: 05/23/2024 Medical Rec #:  [id number]          Height:       63.0 in Accession #:    [id number]         Weight:       175.0 lb Date of Birth:  16-Aug-1951          BSA:          1.827 m Patient Age:    73 years           BP:           183/77 mmHg Patient Gender: F                  HR:           51 bpm. Exam Location:  Inpatient Procedure: 2D Echo, Cardiac Doppler and Color Doppler (Both Spectral and Color            Flow Doppler were utilized during procedure). Indications:    CHF  History:        Patient has no prior history of Echocardiogram examinations.                 Risk Factors:Hypertension.  Sonographer:    Janette Medley Referring Phys: [id number] Charlotta Cook HALL  Sonographer Comments: Echo performed with patient supine and on artificial respirator. IMPRESSIONS  1. Left ventricular ejection fraction, by estimation, is 60 to 65%. The left ventricle has normal function. The left ventricle has no regional wall motion abnormalities. There is moderate left ventricular hypertrophy. Left ventricular diastolic function  could not be evaluated due to severe MAC but is likely abnormal. There is the interventricular septum is flattened in systole, consistent with right ventricular pressure overload.  2. Right ventricular systolic function is moderately reduced. The right ventricular size is moderately enlarged. There is severely elevated pulmonary artery systolic pressure. The estimated right ventricular systolic pressure is 86.5 mmHg.  3. Right atrial size was mildly dilated.  4. The mitral valve is normal in structure. No evidence of mitral valve regurgitation. No evidence of mitral stenosis. Moderate mitral annular calcification.  5. The aortic valve is normal in structure. Aortic valve regurgitation is  not visualized. No aortic  stenosis is present.  6. The inferior vena cava is dilated in size with >50% respiratory variability, suggesting right atrial pressure of 8 mmHg. FINDINGS  Left Ventricle: Left ventricular ejection fraction, by estimation, is 60 to 65%. The left ventricle has normal function. The left ventricle has no regional wall motion abnormalities. The left ventricular internal cavity size was normal in size. There is  moderate left ventricular hypertrophy. The interventricular septum is flattened in systole, consistent with right ventricular pressure overload. Left ventricular diastolic function could not be evaluated due to mitral annular calcification (moderate or greater). Left ventricular diastolic function could not be evaluated. Right Ventricle: The right ventricular size is moderately enlarged. No increase in right ventricular wall thickness. Right ventricular systolic function is moderately reduced. There is severely elevated pulmonary artery systolic pressure. The tricuspid regurgitant velocity is 4.43 m/s, and with an assumed right atrial pressure of 8 mmHg, the estimated right ventricular systolic pressure is 86.5 mmHg. Left Atrium: Left atrial size was normal in size. Right Atrium: Right atrial size was mildly dilated. Pericardium: There is no evidence of pericardial effusion. Mitral Valve: The mitral valve is normal in structure. Moderate mitral annular calcification. No evidence of mitral valve regurgitation. No evidence of mitral valve stenosis. Tricuspid Valve: The tricuspid valve is normal in structure. Tricuspid valve regurgitation is not demonstrated. No evidence of tricuspid stenosis. Aortic Valve: The aortic valve is normal in structure. Aortic valve regurgitation is not visualized. No aortic stenosis is present. Aortic valve mean gradient measures 7.0 mmHg. Aortic valve peak gradient measures 12.1 mmHg. Aortic valve area, by VTI measures 1.59 cm. Pulmonic Valve: The pulmonic valve was normal in  structure. Pulmonic valve regurgitation is not visualized. No evidence of pulmonic stenosis. Aorta: The aortic root is normal in size and structure. Venous: The inferior vena cava is dilated in size with greater than 50% respiratory variability, suggesting right atrial pressure of 8 mmHg. IAS/Shunts: No atrial level shunt detected by color flow Doppler.  LEFT VENTRICLE PLAX 2D LVOT diam:     1.90 cm     Diastology LV SV:         61          LV e' medial:    3.05 cm/s LV SV Index:   33          LV E/e' medial:  31.9 LVOT Area:     2.84 cm    LV e' lateral:   4.35 cm/s                            LV E/e' lateral: 22.4  LV Volumes (MOD) LV vol d, MOD A2C: 41.7 ml LV vol d, MOD A4C: 46.3 ml LV vol s, MOD A2C: 16.5 ml LV vol s, MOD A4C: 19.1 ml LV SV MOD A2C:     25.2 ml LV SV MOD A4C:     46.3 ml LV SV MOD BP:      26.7 ml RIGHT VENTRICLE             IVC RV S prime:     11.90 cm/s  IVC diam: 2.30 cm TAPSE (M-mode): 1.8 cm LEFT ATRIUM             Index        RIGHT ATRIUM           Index LA Vol (A2C):   23.9 ml 13.08 ml/m  RA Area:     19.90 cm LA Vol (A4C):   33.5 ml 18.34 ml/m  RA Volume:   56.50 ml  30.93 ml/m LA Biplane Vol: 29.1 ml 15.93 ml/m  AORTIC VALVE AV Area (Vmax):    1.55 cm AV Area (Vmean):   1.47 cm AV Area (VTI):     1.59 cm AV Vmax:           174.00 cm/s AV Vmean:          125.000 cm/s AV VTI:            0.384 m AV Peak Grad:      12.1 mmHg AV Mean Grad:      7.0 mmHg LVOT Vmax:         95.30 cm/s LVOT Vmean:        64.600 cm/s LVOT VTI:          0.215 m LVOT/AV VTI ratio: 0.56  AORTA Ao Root diam: 2.10 cm Ao Asc diam:  3.30 cm MITRAL VALVE                TRICUSPID VALVE MV Area (PHT): 2.29 cm     TR Peak grad:   78.5 mmHg MV Decel Time: 331 msec     TR Vmax:        443.00 cm/s MV E velocity: 97.30 cm/s MV A velocity: 118.00 cm/s  SHUNTS MV E/A ratio:  0.82         Systemic VTI:  0.22 m                             Systemic Diam: 1.90 cm Arta Lark Electronically signed by Arta Lark  Signature Date/Time: 05/23/2024/12:28:05 PM    Final    DG Chest Port 1 View Result Date: 05/23/2024 CLINICAL DATA:  Shortness of breath EXAM: PORTABLE CHEST 1 VIEW COMPARISON:  Radiograph 02/21/2022 FINDINGS: Stable cardiomediastinal silhouette. Aortic atherosclerotic calcification. Patchy bilateral airspace and interstitial opacities greatest in the left mid and lower lung. Probable small bilateral pleural effusions. No pneumothorax. IMPRESSION: Patchy bilateral airspace and interstitial opacities greatest in the left mid and lower lung. Findings may be due to edema or infection. Electronically Signed   By: Rozell Cornet M.D.   On: 05/23/2024 01:44    Microbiology: No results found for this or any previous visit (from the past 240 hours).   Labs: Basic Metabolic Panel: Recent Labs  Lab 05/24/24 0920 05/25/24 0319 05/25/24 1010 05/26/24 0302 05/27/24 0236 05/28/24 0236 05/29/24 1028 05/30/24 0916  NA 142 139  --  136 140 138 135  --   K 3.8 3.1*  --  3.8 3.8 4.0 3.6  --   CL 103 103  --  102 105 107 102  --   CO2 27 28  --  27 26 25 23   --   GLUCOSE 259* 166*  --  186* 162* 169* 176*  --   BUN 15 20  --  24* 24* 25* 20  --   CREATININE 1.12* 1.30*  --  1.44* 1.23* 1.05* 1.03* 1.02*  CALCIUM  8.8* 8.7*  --  8.4* 8.8* 8.7* 8.7*  --   MG 1.8  --  1.8 1.8  --   --   --   --   PHOS 3.5  --  4.7* 3.7  --   --   --   --    Liver Function Tests: No  results for input(s): "AST", "ALT", "ALKPHOS", "BILITOT", "PROT", "ALBUMIN" in the last 168 hours. No results for input(s): "LIPASE", "AMYLASE" in the last 168 hours. No results for input(s): "AMMONIA" in the last 168 hours. CBC: Recent Labs  Lab 05/25/24 0319 05/26/24 0302 05/27/24 0236 05/28/24 0236 05/29/24 1211  WBC 6.9 8.0 7.0 5.7 5.5  HGB 10.7* 10.4* 11.0* 10.4* 10.9*  HCT 34.3* 33.1* 35.3* 33.6* 34.8*  MCV 87.9 88.5 89.6 89.8 87.2  PLT 205 208 206 196 236   Cardiac Enzymes: No results for input(s): "CKTOTAL", "CKMB",  "CKMBINDEX", "TROPONINI" in the last 168 hours. BNP: BNP (last 3 results) Recent Labs    05/23/24 0130  BNP 916.4*    ProBNP (last 3 results) No results for input(s): "PROBNP" in the last 8760 hours.  CBG: Recent Labs  Lab 05/29/24 1123 05/29/24 1548 05/29/24 2120 05/30/24 0638 05/30/24 1138  GLUCAP 164* 155* 281* 186* 156*       Signed:  Deforest Fast MD.  [facility] 05/30/2024, 12:24 PM

## 2024-05-30 NOTE — Progress Notes (Signed)
 Report called [employer], C. Bevin Bucks, RN to [phone].

## 2024-06-10 ENCOUNTER — Other Ambulatory Visit: Payer: Self-pay | Admitting: Psychiatry

## 2024-06-20 ENCOUNTER — Telehealth (HOSPITAL_COMMUNITY): Payer: Self-pay | Admitting: *Deleted

## 2024-06-20 NOTE — Telephone Encounter (Signed)
 F/U with clinical Manager Elouise Birmingham RN  Looks like this was a hospital patient that Dr. Geralene saw and they stopped the Paliperidone  (Invega ) upon discharge from the hospital

## 2024-06-30 ENCOUNTER — Telehealth (HOSPITAL_COMMUNITY): Payer: Self-pay | Admitting: *Deleted

## 2024-06-30 NOTE — Telephone Encounter (Signed)
 Patient Request

## 2024-07-01 ENCOUNTER — Telehealth (HOSPITAL_COMMUNITY): Payer: Self-pay | Admitting: *Deleted

## 2024-07-01 NOTE — Telephone Encounter (Signed)
 Patient Request  [id number] - , [place] - [address]   NOT SEEN ON MED LIST Paliperidone  Sertraline   Current Med List Trazodone   Seroquel 

## 2024-07-02 ENCOUNTER — Telehealth (HOSPITAL_COMMUNITY): Payer: Self-pay | Admitting: *Deleted

## 2024-07-02 ENCOUNTER — Encounter (HOSPITAL_COMMUNITY): Payer: Self-pay | Admitting: Psychiatry

## 2024-07-02 ENCOUNTER — Telehealth (INDEPENDENT_AMBULATORY_CARE_PROVIDER_SITE_OTHER): Admitting: Psychiatry

## 2024-07-02 DIAGNOSIS — F251 Schizoaffective disorder, depressive type: Secondary | ICD-10-CM

## 2024-07-02 DIAGNOSIS — F5102 Adjustment insomnia: Secondary | ICD-10-CM | POA: Diagnosis not present

## 2024-07-02 DIAGNOSIS — F333 Major depressive disorder, recurrent, severe with psychotic symptoms: Secondary | ICD-10-CM

## 2024-07-02 MED ORDER — ARIPIPRAZOLE 2 MG PO TABS: 3.0000 mg | ORAL_TABLET | Freq: Every day | ORAL | Status: DC

## 2024-07-02 MED ORDER — TRAZODONE HCL 50 MG PO TABS: 50.0000 mg | ORAL_TABLET | Freq: Every evening | ORAL | 1 refills | Status: DC | PRN

## 2024-07-02 MED ORDER — SERTRALINE HCL 100 MG PO TABS: 100.0000 mg | ORAL_TABLET | Freq: Every day | ORAL | 1 refills | Status: DC

## 2024-07-02 MED ORDER — BUSPIRONE HCL 10 MG PO TABS: 10.0000 mg | ORAL_TABLET | Freq: Two times a day (BID) | ORAL | 1 refills | Status: DC

## 2024-07-02 NOTE — Progress Notes (Signed)
 [facility] Follow up visit  Patient Identification: Beth Blankenship MRN:  [id number] Date of Evaluation:  07/02/2024 Referral Source: Psych ED  Chief Complaint:   No chief complaint on file. Follow up depression Visit Diagnosis:    ICD-10-CM   1. Schizoaffective disorder, depressive type (HCC)  F25.1     2. MDD (major depressive disorder), recurrent, severe, with psychosis (HCC)  F33.3     3. Adjustment insomnia  F51.02      Virtual Visit via Video Note  I connected with Beth Blankenship on 07/02/24 at 11:30 AM EDT by a video enabled telemedicine application and verified that I am speaking with the correct person using two identifiers.  Location: Patient: home with husband Provider: home office   I discussed the limitations of evaluation and management by telemedicine and the availability of in person appointments. The patient expressed understanding and agreed to proceed.      I discussed the assessment and treatment plan with the patient. The patient was provided an opportunity to ask questions and all were answered. The patient agreed with the plan and demonstrated an understanding of the instructions.   The patient was advised to call back or seek an in-person evaluation if the symptoms worsen or if the condition fails to improve as anticipated.  I provided 20 minutes of non-face-to-face time during this encounter.    History of Present Illness:    Severe episode of recurrent major depressive disorder, with psychotic features (HCC)     Beth Blankenship, 73 y.o., female patient  has a past psychiatric history of GAD and MDD with psychosis.    Patient has history of mental health deterioration since 2021 after UTI and [facility].  She has had intrusive thoughts about double asking to kill herself in the past.  She also has COPD and is on oxygen  has been on 2 antipsychotics in the past.  There is recent exacerbation of her medical comorbidity and admitted 1 month ago because of  dizziness and shortness of breath Notes and chart reviewed.  Psych consultation done as well and paliperidone  was discontinued was started on Abilify  for possible concern of EPS  On evaluation today she is with her husband still gets dizzy when she stands up husband is very supportive she also has to follow-up with neurology to rule out Lewy body dementia.  She is closely following with providers and regarding her medical comorbidities with recent exacerbation  On evaluation she denies paranoia or hallucinations as of now she is on Abilify  and we will discontinue quetiapine  since she is on 2 antipsychotics with Abilify  has more McCann's of action to help for depression and will avoid seroquel  to combat any dizziness  She is also on BuSpar  2 times a day trazodone  at night and sertraline  150 mg  Reviewed medication we will cut down the sertraline  from 150-100 to reduce side effect if any and monitor depression  Less concerned about double and also discussed to take time when standing up from a sitting position  Can continue trazodone  for now but as as needed basis for sleep since she is going to be off Seroquel    Aggravating factor: medical co morbidities,   Modifying factors: husband, daughters  Duration 3 plus years  Severity baseline  Past Psychiatric History: depression  Previous Psychotropic Medications: Yes   Substance Abuse History in the last 12 months:  No.  Consequences of Substance Abuse: NA  Past Medical History:  Past Medical History:  Diagnosis Date  Arthritis    Cancer (HCC)    Diabetes mellitus without complication (HCC)    H/O mastectomy    left side   History of breast reconstruction    Hypertension    Stroke [facility])    mini strokes    Past Surgical History:  Procedure Laterality Date   MASTECTOMY Left 04/29/2015   REDUCTION MAMMAPLASTY Right    SHOULDER SURGERY Right    TUBAL LIGATION      Family Psychiatric History: aunt ; depression   Family  History:  Family History  Problem Relation Age of Onset   Heart disease Mother    Hypertension Mother    Stroke Mother    Heart disease Father    Diabetes Paternal Aunt    Diabetes Paternal Uncle    Cancer Maternal Grandmother        colon    Social History:   Social History   Socioeconomic History   Marital status: Married    Spouse name: Not on file   Number of children: Not on file   Years of education: Not on file   Highest education level: Not on file  Occupational History   Not on file  Tobacco Use   Smoking status: Former   Smokeless tobacco: Never  Substance and Sexual Activity   Alcohol use: No   Drug use: No   Sexual activity: Not on file  Other Topics Concern   Not on file  Social History Narrative   Not on file   Social Drivers of Health   Financial Resource Strain: Low Risk  (06/17/2024)   Received from [facility]   Overall Financial Resource Strain (CARDIA)    Difficulty of Paying Living Expenses: Not very hard  Recent Concern: Financial Resource Strain - Medium Risk (05/16/2024)   Received from [facility]   Overall Financial Resource Strain (CARDIA)    Difficulty of Paying Living Expenses: Somewhat hard  Food Insecurity: No Food Insecurity (06/17/2024)   Received from [facility]   Hunger Vital Sign    Within the past 12 months, you worried that your food would run out before you got the money to buy more.: Never true    Within the past 12 months, the food you bought just didn't last and you didn't have money to get more.: Never true  Transportation Needs: No Transportation Needs (06/17/2024)   Received from [facility] - Transportation    Lack of Transportation (Medical): No    Lack of Transportation (Non-Medical): No  Physical Activity: Unknown (06/17/2024)   Received from [facility]   Exercise Vital Sign    On average, how many days per week do you engage in moderate to strenuous exercise (like a brisk walk)?: 0 days     Minutes of Exercise per Session: Not on file  Stress: No Stress Concern Present (06/17/2024)   Received from [facility] - Occupational Stress Questionnaire    Feeling of Stress : Not at all  Social Connections: Socially Isolated (06/17/2024)   Received from [facility]   Social Network    How would you rate your social network (family, work, friends)?: Little participation, lonely and socially isolated    Allergies:   Allergies  Allergen Reactions   Grass Pollen(K-O-R-T-Swt Vern)    Latex Hives   Tetracyclines & Related Hives    Metabolic Disorder Labs: Lab Results  Component Value Date   HGBA1C 8.6 (H) 05/23/2024  MPG 200.12 05/23/2024   No results found for: PROLACTIN Lab Results  Component Value Date   CHOL 217 (H) 05/24/2024   TRIG 176 (H) 05/24/2024   HDL 44 05/24/2024   CHOLHDL 4.9 05/24/2024   VLDL 35 05/24/2024   LDLCALC 138 (H) 05/24/2024   No results found for: TSH  Therapeutic Level Labs: No results found for: LITHIUM No results found for: CBMZ No results found for: VALPROATE  Current Medications: Current Outpatient Medications  Medication Sig Dispense Refill   sertraline  (ZOLOFT ) 100 MG tablet Take 1 tablet (100 mg total) by mouth daily. 30 tablet 1   albuterol  (PROVENTIL  HFA;VENTOLIN  HFA) 108 (90 Base) MCG/ACT inhaler Inhale 2 puffs into the lungs every 6 (six) hours as needed for wheezing or shortness of breath.     amLODipine  (NORVASC ) 10 MG tablet Take 1 tablet (10 mg total) by mouth daily. 90 tablet 2   ARIPiprazole  (ABILIFY ) 2 MG tablet Take 1.5 tablets (3 mg total) by mouth daily.     aspirin  EC 81 MG tablet Take 81 mg by mouth daily. Swallow whole.     busPIRone  (BUSPAR ) 10 MG tablet Take 1 tablet (10 mg total) by mouth 2 (two) times daily. 60 tablet 1   clopidogrel  (PLAVIX ) 75 MG tablet Take 75 mg by mouth daily.     cyanocobalamin  1000 MCG tablet Take 1 tablet (1,000 mcg total) by mouth  daily.     doxazosin  (CARDURA ) 4 MG tablet Take 1 tablet (4 mg total) by mouth 2 (two) times daily. 180 tablet 1   empagliflozin (JARDIANCE) 10 MG TABS tablet Take 10 mg by mouth daily. (Patient not taking: Reported on 05/23/2024)     folic acid (FOLVITE) 1 MG tablet Take 1 mg by mouth daily.     furosemide  (LASIX ) 40 MG tablet Take 40 mg by mouth daily.     hydrOXYzine  (ATARAX ) 10 MG tablet Take 1 tablet (10 mg total) by mouth daily as needed for anxiety. 30 tablet 1   isosorbide  mononitrate (IMDUR ) 60 MG 24 hr tablet Take 1 tablet (60 mg total) by mouth every evening.     Melatonin 5 MG CHEW Chew 10 mg by mouth.     metoprolol  tartrate (LOPRESSOR ) 100 MG tablet Take 1 tablet (100 mg total) by mouth 2 (two) times daily. 180 tablet 2   ondansetron  (ZOFRAN -ODT) 4 MG disintegrating tablet Take 1 tablet (4 mg total) by mouth every 8 (eight) hours as needed. 20 tablet 0   potassium chloride  SA (KLOR-CON  M) 20 MEQ tablet Take 1 tablet (20 mEq total) by mouth daily.     spironolactone  (ALDACTONE ) 25 MG tablet Take 0.5 tablets (12.5 mg total) by mouth daily.     thiamine  (VITAMIN B-1) 100 MG tablet Take 1 tablet (100 mg total) by mouth daily.     traZODone  (DESYREL ) 50 MG tablet Take 1 tablet (50 mg total) by mouth at bedtime as needed for sleep. 30 tablet 1   Vitamin D , Ergocalciferol , (DRISDOL ) 1.25 MG (50000 UNIT) CAPS capsule Take 1 capsule (50,000 Units total) by mouth every 7 (seven) days.     No current facility-administered medications for this visit.     Psychiatric Specialty Exam: Review of Systems  Cardiovascular:  Negative for chest pain.  Psychiatric/Behavioral:  Positive for decreased concentration. Negative for self-injury.     There were no vitals taken for this visit.There is no height or weight on file to calculate BMI.  General Appearance: Casual  Eye Contact:  Fair  Speech:  Slow  Volume:  Decreased  Mood: flat affect ,   Affect: constircted  Thought Process:  Goal  Directed  Orientation:  Full (Time, Place, and Person)  Thought Content:  Delusions, Hallucinations: Auditory, Paranoid Ideation, and Rumination  Suicidal Thoughts:  No  Homicidal Thoughts:  No  Memory:  Immediate;   Fair  Judgement:  Poor  Insight:  Shallow  Psychomotor Activity:  Decreased  Concentration:  Concentration: Fair  Recall:  Poor  Fund of Knowledge:Fair  Language: Fair  Akathisia:  no  Handed:    AIMS (if indicated):  no involuntary movements   Assets:  Housing Intimacy Social Support  ADL's:  Impaired, on wheelchair  Cognition: Impaired,  Mild  Sleep:  irregular without meds   Screenings: Oceanographer Row Counselor from 10/31/2022 in [facility] at [facility] Office Visit from 10/17/2022 in [facility] at [facility] ED from 10/16/2022 in [facility]  PHQ-2 Total Score 6 4 3   PHQ-9 Total Score 16 16 15    Flowsheet Row ED to Hosp-Admission (Discharged) from 05/23/2024 in [facility] 3E HF PCU ED from 04/24/2024 in [facility] Emergency Department at [facility] Video Visit from 02/05/2024 in [facility] at [facility]  C-SSRS RISK CATEGORY No Risk No Risk No Risk    Assessment and Plan: as follows  Prior documentation reviewed   MDD with psychosis:schizoaffective disorder: possible.  Paranoia is manageable has supportive husband she is following up with providers see notes above.  Continue sertraline  at 100 mg Abilify  is now 3 mg we will taper down the Seroquel  take half at night for the next 1 week and then discontinue    GAD: Fluctuates continue BuSpar  2 times a day and also sertraline  100 mg   adjustment disorder: Related to her medical comorbidity.  Husband is supportive continue follow-up with her providers and neurology for possible dementia   Insomnia: Reviewed sleep hygiene can take  trazodone  Taper down Seroquel  to reduce any side effects  Follow-up in 6 or 4 weeks or earlier if needed  Collaboration of Care: Other ED evaluation and referral notes reviewed  Patient/Guardian was advised Release of Information must be obtained prior to any record release in order to collaborate their care with an outside provider. Patient/Guardian was advised if they have not already done so to contact the registration department to sign all necessary forms in order for us  to release information regarding their care.   Consent: Patient/Guardian gives verbal consent for treatment and assignment of benefits for services provided during this visit. Patient/Guardian expressed understanding and agreed to proceed.   Jackey Flight, MD 7/9/202511:53 AM

## 2024-07-02 NOTE — Telephone Encounter (Signed)
 Husband call Rx hasn't received  Refill  Patient Request  [id number] - [place], [place] - [address]   Current Med List Trazodone   Seroquel 

## 2024-07-03 ENCOUNTER — Encounter (HOSPITAL_COMMUNITY): Payer: Self-pay | Admitting: *Deleted

## 2024-07-14 ENCOUNTER — Telehealth (HOSPITAL_COMMUNITY): Payer: Self-pay | Admitting: Psychiatry

## 2024-07-14 NOTE — Telephone Encounter (Signed)
 Patient's husband called requesting to cancel patient's remaining appointment(s) and to inform provider that patient died on August 02, 2024.

## 2024-07-25 DEATH — deceased

## 2024-08-11 ENCOUNTER — Telehealth (HOSPITAL_COMMUNITY): Admitting: Psychiatry
